# Patient Record
Sex: Female | Born: 1945 | Race: White | Hispanic: No | Marital: Married | Smoking: Never smoker
Health system: Southern US, Community
[De-identification: ages and names within clinical notes are randomized; demographics above are authoritative.]

## PROBLEM LIST (undated history)

## (undated) DIAGNOSIS — E0500 Thyrotoxicosis with diffuse goiter without thyrotoxic crisis or storm: Secondary | ICD-10-CM

## (undated) DIAGNOSIS — N200 Calculus of kidney: Secondary | ICD-10-CM

## (undated) DIAGNOSIS — R011 Cardiac murmur, unspecified: Secondary | ICD-10-CM

## (undated) DIAGNOSIS — Q278 Other specified congenital malformations of peripheral vascular system: Secondary | ICD-10-CM

## (undated) DIAGNOSIS — H269 Unspecified cataract: Secondary | ICD-10-CM

## (undated) DIAGNOSIS — D689 Coagulation defect, unspecified: Secondary | ICD-10-CM

## (undated) DIAGNOSIS — I6529 Occlusion and stenosis of unspecified carotid artery: Secondary | ICD-10-CM

## (undated) DIAGNOSIS — E079 Disorder of thyroid, unspecified: Secondary | ICD-10-CM

## (undated) DIAGNOSIS — I773 Arterial fibromuscular dysplasia: Secondary | ICD-10-CM

## (undated) DIAGNOSIS — N3946 Mixed incontinence: Secondary | ICD-10-CM

## (undated) DIAGNOSIS — D510 Vitamin B12 deficiency anemia due to intrinsic factor deficiency: Secondary | ICD-10-CM

## (undated) DIAGNOSIS — I10 Essential (primary) hypertension: Secondary | ICD-10-CM

## (undated) DIAGNOSIS — E119 Type 2 diabetes mellitus without complications: Secondary | ICD-10-CM

## (undated) DIAGNOSIS — D472 Monoclonal gammopathy: Secondary | ICD-10-CM

## (undated) DIAGNOSIS — G43909 Migraine, unspecified, not intractable, without status migrainosus: Secondary | ICD-10-CM

## (undated) DIAGNOSIS — E785 Hyperlipidemia, unspecified: Secondary | ICD-10-CM

## (undated) DIAGNOSIS — M889 Osteitis deformans of unspecified bone: Secondary | ICD-10-CM

## (undated) DIAGNOSIS — G63 Polyneuropathy in diseases classified elsewhere: Secondary | ICD-10-CM

## (undated) HISTORY — DX: Thyrotoxicosis with diffuse goiter without thyrotoxic crisis or storm: E05.00

## (undated) HISTORY — DX: Monoclonal gammopathy: D47.2

## (undated) HISTORY — PX: CATARACT EXTRACTION: SUR2

## (undated) HISTORY — PX: RENAL BIOPSY: SHX156

## (undated) HISTORY — PX: EYE SURGERY: SHX253

## (undated) HISTORY — DX: Unspecified cataract: H26.9

## (undated) HISTORY — DX: Mixed incontinence: N39.46

## (undated) HISTORY — DX: Osteitis deformans of unspecified bone: M88.9

## (undated) HISTORY — DX: Calculus of kidney: N20.0

## (undated) HISTORY — DX: Cardiac murmur, unspecified: R01.1

## (undated) HISTORY — DX: Arterial fibromuscular dysplasia: I77.3

## (undated) HISTORY — DX: Occlusion and stenosis of unspecified carotid artery: I65.29

## (undated) HISTORY — PX: WISDOM TOOTH EXTRACTION: SHX21

## (undated) HISTORY — DX: Type 2 diabetes mellitus without complications: E11.9

## (undated) HISTORY — PX: VAGINAL HYSTERECTOMY: SUR661

## (undated) HISTORY — DX: Essential (primary) hypertension: I10

## (undated) HISTORY — DX: Coagulation defect, unspecified: D68.9

## (undated) HISTORY — DX: Hyperlipidemia, unspecified: E78.5

## (undated) HISTORY — DX: Vitamin B12 deficiency anemia due to intrinsic factor deficiency: D51.0

## (undated) HISTORY — DX: Disorder of thyroid, unspecified: E07.9

## (undated) HISTORY — DX: Migraine, unspecified, not intractable, without status migrainosus: G43.909

## (undated) HISTORY — PX: BILATERAL SALPINGOOPHORECTOMY: SHX1223

## (undated) HISTORY — PX: TONSILLECTOMY: SUR1361

## (undated) HISTORY — PX: COLONOSCOPY: SHX174

## (undated) HISTORY — DX: Monoclonal gammopathy: G63

## (undated) HISTORY — DX: Other specified congenital malformations of peripheral vascular system: Q27.8

---

## 1999-02-26 ENCOUNTER — Ambulatory Visit (HOSPITAL_COMMUNITY): Admission: RE | Admit: 1999-02-26 | Discharge: 1999-02-26 | Payer: Self-pay | Admitting: Pediatrics

## 2004-01-14 LAB — HM COLONOSCOPY: HM Colonoscopy: NORMAL

## 2005-04-03 ENCOUNTER — Encounter: Admission: RE | Admit: 2005-04-03 | Discharge: 2005-04-03 | Payer: Self-pay | Admitting: Pediatrics

## 2006-05-08 ENCOUNTER — Ambulatory Visit: Payer: Self-pay | Admitting: Internal Medicine

## 2006-05-20 ENCOUNTER — Ambulatory Visit: Payer: Self-pay | Admitting: Internal Medicine

## 2008-06-16 ENCOUNTER — Ambulatory Visit: Payer: Self-pay | Admitting: Ophthalmology

## 2008-11-07 ENCOUNTER — Ambulatory Visit: Payer: Self-pay | Admitting: Nephrology

## 2009-03-06 ENCOUNTER — Ambulatory Visit: Payer: Self-pay | Admitting: Nephrology

## 2009-03-09 ENCOUNTER — Ambulatory Visit: Payer: Self-pay | Admitting: Urology

## 2009-03-16 ENCOUNTER — Ambulatory Visit: Payer: Self-pay | Admitting: Urology

## 2009-03-22 ENCOUNTER — Ambulatory Visit: Payer: Self-pay | Admitting: Urology

## 2009-07-27 ENCOUNTER — Ambulatory Visit: Payer: Self-pay | Admitting: Internal Medicine

## 2009-10-23 ENCOUNTER — Ambulatory Visit: Payer: Self-pay | Admitting: Urology

## 2009-12-27 ENCOUNTER — Ambulatory Visit: Payer: Self-pay | Admitting: Specialist

## 2010-10-14 ENCOUNTER — Ambulatory Visit: Payer: Self-pay | Admitting: Urology

## 2011-01-14 LAB — HM PAP SMEAR: HM Pap smear: NORMAL

## 2011-01-30 DIAGNOSIS — I150 Renovascular hypertension: Secondary | ICD-10-CM | POA: Insufficient documentation

## 2011-01-30 DIAGNOSIS — I129 Hypertensive chronic kidney disease with stage 1 through stage 4 chronic kidney disease, or unspecified chronic kidney disease: Secondary | ICD-10-CM | POA: Insufficient documentation

## 2011-02-13 LAB — HM MAMMOGRAPHY: HM Mammogram: NORMAL

## 2011-06-24 ENCOUNTER — Other Ambulatory Visit: Payer: Self-pay | Admitting: Internal Medicine

## 2011-06-25 MED ORDER — AMLODIPINE BESYLATE 2.5 MG PO TABS: 2.5000 mg | ORAL_TABLET | Freq: Every day | ORAL | Status: DC

## 2011-07-09 ENCOUNTER — Ambulatory Visit: Payer: Self-pay

## 2011-07-21 ENCOUNTER — Other Ambulatory Visit: Payer: Self-pay | Admitting: Internal Medicine

## 2011-07-21 DIAGNOSIS — R51 Headache: Secondary | ICD-10-CM

## 2011-07-21 MED ORDER — PROPRANOLOL HCL 10 MG PO TABS: 10.0000 mg | ORAL_TABLET | Freq: Two times a day (BID) | ORAL | Status: DC

## 2011-07-22 ENCOUNTER — Other Ambulatory Visit: Payer: Self-pay | Admitting: Internal Medicine

## 2011-08-20 ENCOUNTER — Other Ambulatory Visit: Payer: Self-pay | Admitting: Internal Medicine

## 2011-08-28 ENCOUNTER — Other Ambulatory Visit: Payer: Self-pay | Admitting: Internal Medicine

## 2011-08-28 MED ORDER — LEVOTHYROXINE SODIUM 50 MCG PO TABS: ORAL_TABLET | ORAL | Status: DC

## 2011-10-20 ENCOUNTER — Other Ambulatory Visit: Payer: Self-pay | Admitting: Internal Medicine

## 2011-10-22 NOTE — Telephone Encounter (Signed)
[facility] is requesting refill on amlodipine.  Pt hasn't been seen in this office and has no upcoming appts.

## 2011-11-24 ENCOUNTER — Telehealth: Payer: Self-pay | Admitting: Internal Medicine

## 2011-11-24 MED ORDER — LEVOTHYROXINE SODIUM 50 MCG PO TABS: ORAL_TABLET | ORAL | Status: DC

## 2011-11-24 NOTE — Telephone Encounter (Signed)
Rx has been called in  

## 2011-11-24 NOTE — Telephone Encounter (Signed)
[phone] Pt called to get refill on synthroid 50mg  1 daily 5 days week  75mg  1 daily 2 days week    90 supply [facility] Pt has appointment 4/17

## 2011-12-12 ENCOUNTER — Telehealth: Payer: Self-pay | Admitting: Internal Medicine

## 2011-12-12 ENCOUNTER — Ambulatory Visit (INDEPENDENT_AMBULATORY_CARE_PROVIDER_SITE_OTHER): Payer: BC Managed Care – PPO | Admitting: Internal Medicine

## 2011-12-12 VITALS — BP 122/60 | HR 88 | Temp 97.6°F | Ht 63.0 in | Wt 115.0 lb

## 2011-12-12 DIAGNOSIS — R309 Painful micturition, unspecified: Secondary | ICD-10-CM

## 2011-12-12 DIAGNOSIS — R319 Hematuria, unspecified: Secondary | ICD-10-CM

## 2011-12-12 DIAGNOSIS — N390 Urinary tract infection, site not specified: Secondary | ICD-10-CM

## 2011-12-12 DIAGNOSIS — N23 Unspecified renal colic: Secondary | ICD-10-CM

## 2011-12-12 LAB — POCT URINALYSIS DIPSTICK
Bilirubin, UA: NEGATIVE
Glucose, UA: NEGATIVE
Ketones, UA: NEGATIVE
Nitrite, UA: POSITIVE
Protein, UA: NEGATIVE
Spec Grav, UA: 1.015
Urobilinogen, UA: 0.2
pH, UA: 6

## 2011-12-12 MED ORDER — CIPROFLOXACIN HCL 250 MG PO TABS: 250.0000 mg | ORAL_TABLET | Freq: Two times a day (BID) | ORAL | Status: AC

## 2011-12-12 NOTE — Telephone Encounter (Signed)
Patient is coming in to give a specimen.

## 2011-12-12 NOTE — Assessment & Plan Note (Signed)
Symptoms and urinalysis are consistent with urinary tract infection. Will treat with Cipro x7 days. Patient will call if symptoms are not improving.

## 2011-12-12 NOTE — Progress Notes (Signed)
  Subjective:    Patient ID: Hailey Hunt, female    DOB: 06/30/1946, 66 y.o.   MRN: [id number]  HPI 66 year old female presents for an urgent visit complaining of an 8 hour history of increased urinary frequency, foul smell to her urine, and dysuria. She denies any fever, chills, flank pain.  Outpatient Encounter Prescriptions as of 12/12/2011  Medication Sig Dispense Refill  . amLODipine (NORVASC) 2.5 MG tablet TAKE 1 TABLET EVERY DAY  30 tablet  6  . cyanocobalamin (,VITAMIN B-12,) 1000 MCG/ML injection Inject 1,000 mcg into the muscle once.      Marland Kitchen levothyroxine (SYNTHROID) 50 MCG tablet Take one tablet daily five days per week and one and one-half tablets two days per week.  90 tablet  3  . propranolol (INDERAL) 10 MG tablet Take 1 tablet (10 mg total) by mouth 2 (two) times daily.  60 tablet  11  . traMADol (ULTRAM) 50 MG tablet Take 50 mg by mouth every 6 (six) hours as needed.      . ciprofloxacin (CIPRO) 250 MG tablet Take 1 tablet (250 mg total) by mouth 2 (two) times daily.  14 tablet  0    Review of Systems  Constitutional: Negative for fever, chills and fatigue.  Genitourinary: Positive for dysuria, urgency and frequency. Negative for hematuria and flank pain.  Musculoskeletal: Negative for myalgias, arthralgias and gait problem.  Neurological: Negative for dizziness.   BP 122/60  Pulse 88  Temp(Src) 97.6 F (36.4 C) (Oral)  Ht 5\' 3"  (1.6 m)  Wt 115 lb (52.164 kg)  BMI 20.37 kg/m2  SpO2 99%     Objective:   Physical Exam  Constitutional: She is oriented to person, place, and time. She appears well-developed and well-nourished. No distress.  HENT:  Head: Normocephalic and atraumatic.  Eyes: EOM are normal. Pupils are equal, round, and reactive to light.  Neck: Normal range of motion. Neck supple.  Pulmonary/Chest: Effort normal.  Abdominal: Soft. There is no tenderness (no cva tenderness).  Musculoskeletal: Normal range of motion.  Neurological: She is alert and  oriented to person, place, and time. She has normal reflexes.  Skin: Skin is warm and dry. She is not diaphoretic.  Psychiatric: She has a normal mood and affect. Her behavior is normal. Judgment and thought content normal.          Assessment & Plan:

## 2011-12-12 NOTE — Telephone Encounter (Signed)
[facility] Triage Call Report Triage Record Num: [id number] Operator: Albertine Grates Patient Name: Hailey Hunt Call Date & Time: 12/12/2011 2:52:42PM Patient Phone: [phone] PCP: Duncan Dull Patient Gender: Female PCP Fax : [phone] Patient DOB: March 22, 1946 Practice Name: [facility] Reason for Call: Caller: [name]/Patient; PCP: Duncan Dull; CB#: [phone]; ; ; Call regarding Urinary Pain/Bleeding; Has painful urination since 3-15. Urine has foul odor. Afebrile but has chills. States has used home test strip and was positive for UTI. Is drinking fluids. Feels nauseated when drinks. Disposition see provider within 24hrs. States has history of kidney stones and thinks this will go "fast". States will end up in ED if not handled. PLEASE CALL PT AND ADVISE IF NEEDS TO SEE MD 3-15 OR CAN CALL IN ANTIBIOTIC [phone]. Wants called ASAP as if needs to go to UC wants to go before "everybody gets off work". Protocol(s) Used: Urinary Symptoms - Female Recommended Outcome per Protocol: See Provider within 24 hours Reason for Outcome: Has one or more urinary tract symptoms AND has not been previously evaluated Care Advice: ~ 12/12/2011 3:01:25PM Page 1 of 1 [email or web address]

## 2011-12-15 LAB — CULTURE, URINE COMPREHENSIVE: Colony Count: >=100000

## 2011-12-29 LAB — HM DEXA SCAN

## 2012-01-13 LAB — HM PAP SMEAR: HM Pap smear: NORMAL

## 2012-01-14 ENCOUNTER — Ambulatory Visit (INDEPENDENT_AMBULATORY_CARE_PROVIDER_SITE_OTHER): Payer: BC Managed Care – PPO | Admitting: Internal Medicine

## 2012-01-14 ENCOUNTER — Encounter: Payer: Self-pay | Admitting: Internal Medicine

## 2012-01-14 VITALS — BP 108/60 | HR 66 | Temp 98.2°F | Resp 14 | Ht 63.0 in | Wt 115.8 lb

## 2012-01-14 DIAGNOSIS — Z1239 Encounter for other screening for malignant neoplasm of breast: Secondary | ICD-10-CM

## 2012-01-14 DIAGNOSIS — R5381 Other malaise: Secondary | ICD-10-CM

## 2012-01-14 DIAGNOSIS — I10 Essential (primary) hypertension: Secondary | ICD-10-CM

## 2012-01-14 DIAGNOSIS — E039 Hypothyroidism, unspecified: Secondary | ICD-10-CM

## 2012-01-14 DIAGNOSIS — I773 Arterial fibromuscular dysplasia: Secondary | ICD-10-CM | POA: Insufficient documentation

## 2012-01-14 DIAGNOSIS — D510 Vitamin B12 deficiency anemia due to intrinsic factor deficiency: Secondary | ICD-10-CM | POA: Insufficient documentation

## 2012-01-14 DIAGNOSIS — Z9289 Personal history of other medical treatment: Secondary | ICD-10-CM

## 2012-01-14 DIAGNOSIS — I7789 Other specified disorders of arteries and arterioles: Secondary | ICD-10-CM

## 2012-01-14 DIAGNOSIS — Z13820 Encounter for screening for osteoporosis: Secondary | ICD-10-CM

## 2012-01-14 DIAGNOSIS — R5383 Other fatigue: Secondary | ICD-10-CM

## 2012-01-14 DIAGNOSIS — E559 Vitamin D deficiency, unspecified: Secondary | ICD-10-CM

## 2012-01-14 DIAGNOSIS — Z9189 Other specified personal risk factors, not elsewhere classified: Secondary | ICD-10-CM

## 2012-01-14 NOTE — Progress Notes (Signed)
Patient ID: Hailey Hunt, female   DOB: Jan 29, 1946, 66 y.o.   MRN: [id number]   Patient Active Problem List  Diagnoses  . Urinary tract infection  . Pernicious anemia  . History of transfusion of packed red blood cells  . Fibromuscular dysplasia of renal artery  . Hypertension  . Neuropathy with IgA monoclonal gammopathy  . Dysplasia, artery, fibromuscular, renal    Subjective:  CC:   Chief Complaint  Patient presents with  . Annual Exam    HPI:   Hailey Hunt a 65 y.o. female who presents for her annual exam,  She does not need a breast or pelvic exam as this was done by her gynecologist at [facility] and [place],.  She had a colonoscopy 2005 at age 57, which was normal.  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  She has no acute issues.    Past Medical History  Diagnosis Date  . Kidney stone   . Hypertension   . Fibromuscular dysplasia of renal artery     managed by Dr. Mel[facility]  . Neuropathy with IgA monoclonal gammopathy     Dr Ellis Savage  . Dysplasia, artery, fibromuscular, renal     Past Surgical History  Procedure Date  . Cataract extraction Sep 10, 2011, Jan 13,2013  . Renal biopsy   . Vaginal delivery     x2         The following portions of the patient's history were reviewed and updated as appropriate: Allergies, current medications, and problem list.    Review of Systems:   12 Pt  review of systems was negative except those addressed in the HPI,     History   Social History  . Marital Status: Married    Spouse Name: N/A    Number of Children: 2  . 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 Smoking status: Never Smoker   . Smokeless tobacco: Never Used  . Alcohol Use: No  . Drug Use: No  . Sexually Active: Not on file   Other Topics Concern  . Not on file   Social  History Narrative  . No narrative on file    Objective:  BP 108/60  Pulse 66  Temp(Src) 98.2 F (36.8 C) (Oral)  Resp 14  Ht 5\' 3"  (1.6 m)  Wt 115 lb 12 oz (52.504 kg)  BMI 20.50 kg/m2  SpO2 99%  General appearance: alert, cooperative and appears stated age Ears: normal TM's and external ear canals both ears Throat: lips, mucosa, and tongue normal; teeth and gums normal Neck: no adenopathy, no carotid bruit, supple, symmetrical, trachea midline and thyroid not enlarged, symmetric, no tenderness/mass/nodules Back: symmetric, no curvature. ROM normal. No CVA tenderness. Lungs: clear to auscultation bilaterally Heart: regular rate and rhythm, S1, S2 normal, no murmur, click, rub or gallop Abdomen: soft, non-tender; bowel sounds normal; no masses,  no organomegaly Pulses: 2+ and symmetric Skin: Skin color, texture, turgor normal. No rashes or lesions Lymph nodes: Cervical, supraclavicular, and axillary nodes normal.  Assessment and Plan:  Fibromuscular dysplasia of renal artery Found by [facility] in August,  Carotids 2011.  Annual surveillance in August .  Blood pressure is well  Controlled. Carotid bruits, bilateral are noted.   Pernicious anemia She has not had a b12 injection in months.  Will check level  History of transfusion of packed red blood cells She has had a prior transfusion and has requested screening for hepatitis C    Updated Medication List Outpatient Encounter Prescriptions as of 01/14/2012  Medication Sig Dispense Refill  . adapalene (DIFFERIN) 0.1 % cream Apply topically at bedtime.      Marland Kitchen amLODipine (NORVASC) 2.5 MG tablet TAKE 1 TABLET EVERY DAY  30 tablet  6  . hydrochlorothiazide (HYDRODIURIL) 25 MG tablet Take 25 mg by mouth daily.      Marland Kitchen levothyroxine (SYNTHROID, LEVOTHROID) 50 MCG tablet Take one tablet daily five days per week and one and one-half tablets two days per week.  Name Brand Only      . propranolol (INDERAL) 10 MG tablet Take 1 tablet (10  mg total) by mouth 2 (two) times daily.  60 tablet  11  . traMADol (ULTRAM) 50 MG tablet Take 50 mg by mouth every 6 (six) hours as needed.      Marland Kitchen DISCONTD: levothyroxine (SYNTHROID) 50 MCG tablet Take one tablet daily five days per week and one and one-half tablets two days per week.  90 tablet  3  . DISCONTD: cyanocobalamin (,VITAMIN B-12,) 1000 MCG/ML injection Inject 1,000 mcg into the muscle once.         Orders Placed This Encounter  Procedures  . HM MAMMOGRAPHY  . DG Bone Density  . HM PAP SMEAR  . Vitamin D 25 hydroxy  . TSH  . Lipid panel  . COMPLETE METABOLIC PANEL WITH GFR  . Vitamin B12  . CBC with Differential  . Hepatitis C Antibody  . HM COLONOSCOPY    No Follow-up on file.

## 2012-01-14 NOTE — Patient Instructions (Signed)
Sign up for Mychart and you can get your labs online and send messages this way.

## 2012-01-14 NOTE — Assessment & Plan Note (Addendum)
Found by [facility] in August,  Carotids 2011.  Annual surveillance in August .  Blood pressure is well  Controlled. Carotid bruits, bilateral are noted.

## 2012-01-15 ENCOUNTER — Encounter: Payer: Self-pay | Admitting: Internal Medicine

## 2012-01-15 DIAGNOSIS — I773 Arterial fibromuscular dysplasia: Secondary | ICD-10-CM | POA: Insufficient documentation

## 2012-01-15 NOTE — Assessment & Plan Note (Signed)
She has had a prior transfusion and has requested screening for hepatitis C

## 2012-01-15 NOTE — Assessment & Plan Note (Signed)
She has not had a b12 injection in months.  Will check level

## 2012-01-20 ENCOUNTER — Other Ambulatory Visit: Payer: BC Managed Care – PPO

## 2012-01-20 LAB — COMPLETE METABOLIC PANEL WITHOUT GFR
ALT: 12 U/L (ref 0–35)
AST: 22 U/L (ref 0–37)
Albumin: 4.6 g/dL (ref 3.5–5.2)
Alkaline Phosphatase: 90 U/L (ref 39–117)
BUN: 16 mg/dL (ref 6–23)
CO2: 29 meq/L (ref 19–32)
Calcium: 10 mg/dL (ref 8.4–10.5)
Chloride: 103 meq/L (ref 96–112)
Creat: 1.07 mg/dL (ref 0.50–1.10)
GFR, Est African American: 64 mL/min
GFR, Est Non African American: 55 mL/min — ABNORMAL LOW
Glucose, Bld: 89 mg/dL (ref 70–99)
Potassium: 3.9 meq/L (ref 3.5–5.3)
Sodium: 143 meq/L (ref 135–145)
Total Bilirubin: 0.6 mg/dL (ref 0.3–1.2)
Total Protein: 6.9 g/dL (ref 6.0–8.3)

## 2012-01-20 LAB — CBC WITH DIFFERENTIAL/PLATELET
Basophils Absolute: 0 10*3/uL (ref 0.0–0.1)
Basophils Relative: 0.9 % (ref 0.0–3.0)
Eosinophils Absolute: 0.2 10*3/uL (ref 0.0–0.7)
Eosinophils Relative: 3.2 % (ref 0.0–5.0)
HCT: 38.9 % (ref 36.0–46.0)
Hemoglobin: 12.6 g/dL (ref 12.0–15.0)
Lymphocytes Relative: 28.9 % (ref 12.0–46.0)
Lymphs Abs: 1.7 10*3/uL (ref 0.7–4.0)
MCHC: 32.4 g/dL (ref 30.0–36.0)
MCV: 88.9 fl (ref 78.0–100.0)
Monocytes Absolute: 0.3 10*3/uL (ref 0.1–1.0)
Monocytes Relative: 5.4 % (ref 3.0–12.0)
Neutro Abs: 3.5 10*3/uL (ref 1.4–7.7)
Neutrophils Relative %: 61.6 % (ref 43.0–77.0)
Platelets: 327 10*3/uL (ref 150.0–400.0)
RBC: 4.37 Mil/uL (ref 3.87–5.11)
RDW: 14.4 % (ref 11.5–14.6)
WBC: 5.7 10*3/uL (ref 4.5–10.5)

## 2012-01-20 LAB — LIPID PANEL
Cholesterol: 228 mg/dL — ABNORMAL HIGH (ref 0–200)
HDL: 80.5 mg/dL (ref >39.00)
Total CHOL/HDL Ratio: 3
Triglycerides: 68 mg/dL (ref 0.0–149.0)
VLDL: 13.6 mg/dL (ref 0.0–40.0)

## 2012-01-20 LAB — LDL CHOLESTEROL, DIRECT: Direct LDL: 138.5 mg/dL

## 2012-01-20 LAB — TSH: TSH: 0.38 u[IU]/mL (ref 0.35–5.50)

## 2012-01-20 LAB — VITAMIN B12: Vitamin B-12: 174 pg/mL — ABNORMAL LOW (ref 211–911)

## 2012-01-21 ENCOUNTER — Telehealth: Payer: Self-pay | Admitting: Internal Medicine

## 2012-01-21 DIAGNOSIS — E559 Vitamin D deficiency, unspecified: Secondary | ICD-10-CM | POA: Insufficient documentation

## 2012-01-21 LAB — HEPATITIS C ANTIBODY: HCV Ab: NEGATIVE

## 2012-01-21 LAB — VITAMIN D 25 HYDROXY (VIT D DEFICIENCY, FRACTURES): Vit D, 25-Hydroxy: 21 ng/mL — ABNORMAL LOW (ref 30–89)

## 2012-01-21 MED ORDER — ERGOCALCIFEROL 1.25 MG (50000 UT) PO CAPS: 50000.0000 [IU] | ORAL_CAPSULE | ORAL | Status: DC

## 2012-01-21 NOTE — Progress Notes (Signed)
Addended by: Duncan Dull on: 01/21/2012 01:30 PM   Modules accepted: Orders

## 2012-01-22 MED ORDER — "SYRINGE 27G X 1-1/4"" 3 ML MISC": Status: DC

## 2012-01-22 MED ORDER — CYANOCOBALAMIN 1000 MCG/ML IJ SOLN: INTRAMUSCULAR | Status: DC

## 2012-01-27 ENCOUNTER — Telehealth: Payer: Self-pay | Admitting: Internal Medicine

## 2012-01-27 NOTE — Telephone Encounter (Signed)
Bone density was normal except mild bone loss left hip.  contineu calcium/vitmain d and exercise.  Repeat in 2 yrs

## 2012-01-27 NOTE — Telephone Encounter (Signed)
Patient notified

## 2012-02-04 ENCOUNTER — Encounter: Payer: Self-pay | Admitting: Internal Medicine

## 2012-02-12 ENCOUNTER — Ambulatory Visit: Payer: BC Managed Care – PPO

## 2012-02-12 ENCOUNTER — Other Ambulatory Visit (INDEPENDENT_AMBULATORY_CARE_PROVIDER_SITE_OTHER): Payer: BC Managed Care – PPO | Admitting: *Deleted

## 2012-02-12 DIAGNOSIS — N390 Urinary tract infection, site not specified: Secondary | ICD-10-CM

## 2012-02-12 LAB — HM MAMMOGRAPHY: HM Mammogram: NORMAL

## 2012-02-13 LAB — URINALYSIS
Bilirubin Urine: NEGATIVE
Glucose, UA: NEGATIVE mg/dL
Ketones, ur: NEGATIVE mg/dL
Nitrite: NEGATIVE
Protein, ur: NEGATIVE mg/dL
Specific Gravity, Urine: 1.01 (ref 1.005–1.030)
Urobilinogen, UA: 0.2 mg/dL (ref 0.0–1.0)
pH: 7 (ref 5.0–8.0)

## 2012-02-15 LAB — URINE CULTURE: Colony Count: >=100000

## 2012-02-16 ENCOUNTER — Encounter: Payer: Self-pay | Admitting: Internal Medicine

## 2012-03-03 ENCOUNTER — Encounter: Payer: Self-pay | Admitting: Internal Medicine

## 2012-03-03 ENCOUNTER — Ambulatory Visit (INDEPENDENT_AMBULATORY_CARE_PROVIDER_SITE_OTHER): Payer: BC Managed Care – PPO | Admitting: Internal Medicine

## 2012-03-03 VITALS — BP 102/62 | HR 80 | Temp 99.1°F | Ht 63.0 in | Wt 111.5 lb

## 2012-03-03 DIAGNOSIS — A938 Other specified arthropod-borne viral fevers: Secondary | ICD-10-CM

## 2012-03-03 LAB — CBC WITH DIFFERENTIAL/PLATELET
Basophils Absolute: 0 10*3/uL (ref 0.0–0.1)
Basophils Relative: 0.5 % (ref 0.0–3.0)
Eosinophils Absolute: 0 10*3/uL (ref 0.0–0.7)
Eosinophils Relative: 0.1 % (ref 0.0–5.0)
HCT: 37.9 % (ref 36.0–46.0)
Hemoglobin: 12.4 g/dL (ref 12.0–15.0)
Lymphocytes Relative: 18.2 % (ref 12.0–46.0)
Lymphs Abs: 1.2 10*3/uL (ref 0.7–4.0)
MCHC: 32.8 g/dL (ref 30.0–36.0)
MCV: 87 fl (ref 78.0–100.0)
Monocytes Absolute: 0.4 10*3/uL (ref 0.1–1.0)
Monocytes Relative: 6.8 % (ref 3.0–12.0)
Neutro Abs: 4.7 10*3/uL (ref 1.4–7.7)
Neutrophils Relative %: 74.4 % (ref 43.0–77.0)
Platelets: 247 10*3/uL (ref 150.0–400.0)
RBC: 4.35 Mil/uL (ref 3.87–5.11)
RDW: 14.8 % — ABNORMAL HIGH (ref 11.5–14.6)
WBC: 6.4 10*3/uL (ref 4.5–10.5)

## 2012-03-03 LAB — COMPREHENSIVE METABOLIC PANEL
ALT: 11 U/L (ref 0–35)
AST: 22 U/L (ref 0–37)
Albumin: 4.1 g/dL (ref 3.5–5.2)
Alkaline Phosphatase: 86 U/L (ref 39–117)
BUN: 13 mg/dL (ref 6–23)
CO2: 29 mEq/L (ref 19–32)
Calcium: 8.9 mg/dL (ref 8.4–10.5)
Chloride: 101 mEq/L (ref 96–112)
Creatinine, Ser: 1.2 mg/dL (ref 0.4–1.2)
GFR: 48.6 mL/min — ABNORMAL LOW (ref >60.00)
Glucose, Bld: 80 mg/dL (ref 70–99)
Potassium: 3.7 mEq/L (ref 3.5–5.1)
Sodium: 140 mEq/L (ref 135–145)
Total Bilirubin: 0.6 mg/dL (ref 0.3–1.2)
Total Protein: 6.8 g/dL (ref 6.0–8.3)

## 2012-03-03 MED ORDER — DOXYCYCLINE HYCLATE 100 MG PO TABS: 100.0000 mg | ORAL_TABLET | Freq: Two times a day (BID) | ORAL | Status: AC

## 2012-03-03 NOTE — Assessment & Plan Note (Signed)
Symptoms are most consistent with [facility] Spotted Fever or erhlichiosis. Will treat with doxycycline twice daily for 2 weeks. Given that she has significant fatigue, will check CBC and IgM testing for RMSF. She will use Tylenol as needed for fever. If symptoms worsen or persist over the next 48 hours, she will call our office.

## 2012-03-03 NOTE — Patient Instructions (Signed)
[place] Spotted Fever °[place] Spotted Fever (RMSF) is the oldest known tick-borne disease of people in the [place]. This disease was named because it was first described among people in the [place] area who had an illness characterized by a rash with red-purple-black spots. This disease is caused by a rickettsia (Rickettsia rickettsii), a bacteria carried by the tick. °The [place] wood tick and the [place] dog tick, acquire and transmit the RMSF bacteria (pictures NOT actual size). When a larval, nymphal or adult tick feeds on an infected rodent or larger animal, the tick can become infected. Infected adult ticks then feed on people who may then get RMSF. The tick transmits the disease to humans during a prolonged period of feeding that lasts many hours, days or even a couple weeks. The bite is painless and frequently goes unnoticed. An infected female tick may also pass the rickettsial bacteria to her eggs that then may mature to be infected adult ticks. °The rickettsia that causes RMSF can also get into a person's body through damaged skin. A tick bite is not necessary. People can get RMSF if they crush a tick and get it's blood or body fluids on their skin through a small cut or sore.  °DIAGNOSIS °Diagnosis is made by laboratory tests.  °TREATMENT °Treatment is with antibiotics (medications that kill rickettsia and other bacteria). Immediate treatment usually prevents death. °GEOGRAPHIC RANGE °This disease was reported only in the [place] until 1931. RMSF has more recently been described among individuals in all states except [place], [place] and [place]. The highest reported incidences of RMSF now occur among residents of [place], [place], [place] and the [place]. °TIME OF YEAR  °Most cases are diagnosed during late spring and summer when ticks are most active. However, especially in the warmer southern states, a few cases occur during the winter. °SYMPTOMS   °· Symptoms of RMSF begin from 2 to 14 days after a tick bite. The most common early symptoms are fever, muscle aches and headache followed by nausea (feeling sick to your stomach) or vomiting.  °· The RMSF rash is typically delayed until 3 or more days after symptom onset, and eventually develops in 9 of 10 infected patients by the 5th day of illness.  °If the disease is not treated it can cause death. If you get a fever, headache, muscle aches, rash, nausea or vomiting within 2 weeks of a possible tick bite or exposure you should see your caregiver immediately. °PREVENTION °Ticks prefer to hide in shady, moist ground litter. They can often be found above the ground clinging to tall grass, brush, shrubs and low tree branches. They also inhabit lawns and gardens, especially at the edges of woodlands and around old stone walls. Within the areas where ticks generally live, no naturally vegetated area can be considered completely free of infected ticks. The best precaution against RMSF is to avoid contact with soil, leaf litter and vegetation as much as possible in tick infested areas. For those who enjoy gardening or walking in their yards, clear brush and mow tall grass around houses and at the edges of gardens. This may help reduce the tick population in the immediate area. Applications of chemical insecticides by a licensed professional in the spring (late May) and Fall (September) will also control ticks, especially in heavily infested areas. Treatment will never get rid of all the ticks. Getting rid of small animal populations that host ticks will also decrease the tick population. When working in   the garden, pruning shrubs, or handling soil and vegetation, wear light-colored protective clothing and gloves. Spot-check often to prevent ticks from reaching the skin. Ticks cannot jump or fly. They will not drop from an above-ground perch onto a passing animal. Once a tick gains access to human skin it climbs upward  until it reaches a more protected area. For example, the back of the knee, groin, navel, armpit, ears or nape of the neck. It then begins the slow process of embedding itself in the skin. °Campers, hikers, field workers, and others who spend time in wooded, brushy or tall grassy areas can avoid exposure to ticks by using the following precautions: °· Wear light-colored clothing with a tight weave to spot ticks more easily and prevent contact with the skin.  °· Wear long pants tucked into socks, long-sleeved shirts tucked into pants and enclosed shoes or boots along with insect repellent.  °· Spray clothes with insect repellent containing either DEET or Permethrin. Only DEET can be used on exposed skin. Follow the manufacturer's directions carefully.  °· Wear a hat and keep long hair pulled back.  °· Stay on cleared, well-worn trails whenever possible.  °· Spot-check yourself and others often for the presence of ticks on clothes. If you find one, there are likely to be others. Check thoroughly.  °· Remove clothes after leaving tick-infested areas. If possible, wash them to eliminate any unseen ticks. Check yourself, your children and any pets from head to toe for the presence of ticks.  °· Shower and shampoo.  °You can greatly reduce your chances of contracting RMSF if you remove attached ticks as soon as possible. Regular checks of the body, including all body sites covered by hair (head, armpits, genitals), allow removal of the tick before rickettsial transmission. To remove an attached tick, use a forceps or tweezers to detach the intact tick without leaving mouth parts in the skin. The tick bite wound should be cleansed after tick removal. °Remember the most common symptoms of RMSF are fever, muscle aches, headache and nausea or vomiting with a later onset of rash. If you get these symptoms after a tick bite and while living in an area where RMSF is found, RMSF should be suspected. If the disease is not treated,  it can cause death. See your caregiver immediately if you get these symptoms. Do this even if not aware of a tick bite. °Document Released: 12/28/2000 Document Revised: 09/04/2011 Document Reviewed: 08/20/2009 °ExitCare® Patient Information ©2012 [employer]. °

## 2012-03-03 NOTE — Progress Notes (Signed)
Subjective:    Patient ID: Hailey Hunt, female    DOB: 1946/02/26, 66 y.o.   MRN: [id number]  HPI 66 year old female with history of hypertension, hypothyroidism presents with three-day history of fever, diffuse myalgias, headache, and general malaise. She also has occasional cough which is nonproductive. She has not had any sinus congestion or drainage. She notes that approximately 10 days ago she was bit by a tick in her groin. She removed the tick and brings in with her today. She continues to have some redness and swelling at the site of the tick bite. She has not taken any medications for her symptoms. She does not have any rash.  Outpatient Encounter Prescriptions as of 03/03/2012  Medication Sig Dispense Refill  . adapalene (DIFFERIN) 0.1 % cream Apply topically at bedtime.      Marland Kitchen amLODipine (NORVASC) 2.5 MG tablet TAKE 1 TABLET EVERY DAY  30 tablet  6  . cyanocobalamin (,VITAMIN B-12,) 1000 MCG/ML injection Inject 1 ml into muscle weekly for 3 weeks, then monthly  10 mL  1  . ergocalciferol (DRISDOL) 50000 UNITS capsule Take 1 capsule (50,000 Units total) by mouth once a week.  4 capsule  0  . hydrochlorothiazide (HYDRODIURIL) 25 MG tablet Take 25 mg by mouth daily.      Marland Kitchen levothyroxine (SYNTHROID, LEVOTHROID) 50 MCG tablet Take one tablet daily five days per week and one and one-half tablets two days per week.  Name Brand Only      . propranolol (INDERAL) 10 MG tablet Take 1 tablet (10 mg total) by mouth 2 (two) times daily.  60 tablet  11  . Syringe/Needle, Disp, (SYRINGE 3CC/27GX1-1/4") 27G X 1-1/4" 3 ML MISC For B12 injections  100 each  0  . doxycycline (VIBRA-TABS) 100 MG tablet Take 1 tablet (100 mg total) by mouth 2 (two) times daily.  28 tablet  0  . DISCONTD: traMADol (ULTRAM) 50 MG tablet Take 50 mg by mouth every 6 (six) hours as needed.        Review of Systems  Constitutional: Positive for fever, chills and fatigue. Negative for appetite change and unexpected weight  change.  HENT: Negative for ear pain, congestion, sore throat, trouble swallowing, neck pain, voice change and sinus pressure.   Eyes: Negative for visual disturbance.  Respiratory: Positive for cough. Negative for shortness of breath, wheezing and stridor.   Cardiovascular: Negative for chest pain, palpitations and leg swelling.  Gastrointestinal: Negative for nausea, vomiting, abdominal pain, diarrhea, constipation, blood in stool, abdominal distention and anal bleeding.  Genitourinary: Negative for dysuria and flank pain.  Musculoskeletal: Positive for myalgias and arthralgias. Negative for gait problem.  Skin: Positive for color change and wound. Negative for rash.  Neurological: Negative for dizziness and headaches.  Hematological: Negative for adenopathy. Does not bruise/bleed easily.  Psychiatric/Behavioral: Negative for suicidal ideas, sleep disturbance and dysphoric mood. The patient is not nervous/anxious.    BP 102/62  Pulse 80  Temp(Src) 99.1 F (37.3 C) (Oral)  Ht 5\' 3"  (1.6 m)  Wt 111 lb 8 oz (50.576 kg)  BMI 19.75 kg/m2  SpO2 96%     Objective:   Physical Exam  Constitutional: She is oriented to person, place, and time. She appears well-developed and well-nourished. No distress.  HENT:  Head: Normocephalic and atraumatic.  Right Ear: External ear normal.  Left Ear: External ear normal.  Nose: Nose normal.  Mouth/Throat: Oropharynx is clear and moist. No oropharyngeal exudate.  Eyes: Conjunctivae are normal. Pupils are  equal, round, and reactive to light. Right eye exhibits no discharge. Left eye exhibits no discharge. No scleral icterus.  Neck: Normal range of motion. Neck supple. No tracheal deviation present. No thyromegaly present.  Cardiovascular: Normal rate, regular rhythm, normal heart sounds and intact distal pulses.  Exam reveals no gallop and no friction rub.   No murmur heard. Pulmonary/Chest: Effort normal and breath sounds normal. No respiratory  distress. She has no wheezes. She has no rales. She exhibits no tenderness.  Musculoskeletal: Normal range of motion. She exhibits no edema and no tenderness.  Lymphadenopathy:    She has no cervical adenopathy.  Neurological: She is alert and oriented to person, place, and time. No cranial nerve deficit. She exhibits normal muscle tone. Coordination normal.  Skin: Skin is warm and dry. Lesion noted. No rash noted. She is not diaphoretic. There is erythema. No pallor.     Psychiatric: She has a normal mood and affect. Her behavior is normal. Judgment and thought content normal.          Assessment & Plan:

## 2012-03-04 LAB — ROCKY MTN SPOTTED FVR AB, IGM-BLOOD: ROCKY MTN SPOTTED FEVER, IGM: 0.07 IV

## 2012-03-05 ENCOUNTER — Encounter: Payer: Self-pay | Admitting: Internal Medicine

## 2012-03-08 ENCOUNTER — Encounter: Payer: Self-pay | Admitting: Internal Medicine

## 2012-03-09 ENCOUNTER — Ambulatory Visit: Payer: BC Managed Care – PPO | Admitting: Internal Medicine

## 2012-04-27 ENCOUNTER — Telehealth: Payer: Self-pay | Admitting: Internal Medicine

## 2012-04-27 MED ORDER — TRETINOIN 0.025 % EX CREA: TOPICAL_CREAM | Freq: Every day | CUTANEOUS | Status: DC

## 2012-04-27 NOTE — Telephone Encounter (Signed)
Patient needs her 45 tretinoin 0.025% called into [facility] pharmacy.

## 2012-06-01 ENCOUNTER — Ambulatory Visit: Payer: Self-pay | Admitting: Urology

## 2012-06-01 ENCOUNTER — Other Ambulatory Visit: Payer: Self-pay | Admitting: *Deleted

## 2012-06-01 MED ORDER — AMLODIPINE BESYLATE 2.5 MG PO TABS: 2.5000 mg | ORAL_TABLET | Freq: Every day | ORAL | Status: DC

## 2012-06-17 ENCOUNTER — Ambulatory Visit: Payer: Self-pay | Admitting: Vascular Surgery

## 2012-06-29 ENCOUNTER — Telehealth: Payer: Self-pay | Admitting: Internal Medicine

## 2012-06-29 DIAGNOSIS — R51 Headache: Secondary | ICD-10-CM

## 2012-06-29 NOTE — Telephone Encounter (Signed)
Refill request propranolol hcl 10 mg tab #60  Sig: take one tablet twice a day

## 2012-06-30 MED ORDER — PROPRANOLOL HCL 10 MG PO TABS: 10.0000 mg | ORAL_TABLET | Freq: Two times a day (BID) | ORAL | Status: DC

## 2012-07-13 ENCOUNTER — Encounter: Payer: Self-pay | Admitting: Internal Medicine

## 2012-07-13 ENCOUNTER — Ambulatory Visit (INDEPENDENT_AMBULATORY_CARE_PROVIDER_SITE_OTHER): Payer: BC Managed Care – PPO | Admitting: Internal Medicine

## 2012-07-13 VITALS — BP 112/64 | HR 61 | Temp 97.5°F | Ht 63.5 in | Wt 113.5 lb

## 2012-07-13 DIAGNOSIS — R519 Headache, unspecified: Secondary | ICD-10-CM

## 2012-07-13 DIAGNOSIS — I10 Essential (primary) hypertension: Secondary | ICD-10-CM

## 2012-07-13 DIAGNOSIS — I7789 Other specified disorders of arteries and arterioles: Secondary | ICD-10-CM

## 2012-07-13 DIAGNOSIS — Z23 Encounter for immunization: Secondary | ICD-10-CM

## 2012-07-13 DIAGNOSIS — I773 Arterial fibromuscular dysplasia: Secondary | ICD-10-CM

## 2012-07-13 DIAGNOSIS — E039 Hypothyroidism, unspecified: Secondary | ICD-10-CM

## 2012-07-13 DIAGNOSIS — R51 Headache: Secondary | ICD-10-CM

## 2012-07-13 DIAGNOSIS — D510 Vitamin B12 deficiency anemia due to intrinsic factor deficiency: Secondary | ICD-10-CM

## 2012-07-13 DIAGNOSIS — E279 Disorder of adrenal gland, unspecified: Secondary | ICD-10-CM

## 2012-07-13 NOTE — Progress Notes (Signed)
Patient ID: Hailey Hunt, female   DOB: 1945-10-05, 66 y.o.   MRN: [id number] Patient Active Problem List  Diagnosis  . Pernicious anemia  . History of transfusion of packed red blood cells  . Fibromuscular dysplasia of renal artery  . Hypertension  . Neuropathy with IgA monoclonal gammopathy  . Vitamin d deficiency  . Tick-borne fever  . Unspecified hypothyroidism  . Nocturnal headaches    Subjective:  CC:   Chief Complaint  Patient presents with  . Follow-up    flu shot    HPI:   Hailey Hunt a 66 y.o. female who presents Six-month followup on chronic conditions. She recently developed recurrent nightly headaches which prompted MRA neck with contrast,  and MRA brain ordered, by Dr Wyn Quaker. 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  FMD not seen  Except in carotids, which was hemondynamically insignficant.  She continues to have nightly headaches.  Has been referred to [facility]  headache specialist .   the headaches are biparietal and pounding in nature. They are not accompanied by  vision changes but  she does experience some dizziness, orthostatic,  which is chronic,.  The  headache resolves after a few hours without intervention or medication .  She has not had a  prior sleep study, but husband says she occasionally snores,   she has no history of bruxism or symptoms thereof.  Has had 3 dreams in which she fainted and wonders is this text is significant .  Her mammogram was  normal in May,  PAP smear was done by Dr. Lula Olszewski at [facility] the summer and was also normal. She had her colonoscopy at age 50 by Dr. Lina Sar which was also normal.    Past Medical History  Diagnosis Date  . Kidney stone   . Hypertension   . Fibromuscular dysplasia of renal artery     managed by Dr. Mel[facility]  . Neuropathy with IgA monoclonal gammopathy     Dr Ellis Savage  . Dysplasia, artery, fibromuscular, renal     Past Surgical History  Procedure Date  .  Cataract extraction Sep 10, 2011, Jan 13,2013  . Renal biopsy   . Vaginal delivery     x2         The following portions of the patient's history were reviewed and updated as appropriate: Allergies, current medications, and problem list.    Review of Systems:   Comprehensive  review of systems was negative except those addressed in the HPI,     History   Social History  . Marital Status: Married    Spouse Name: N/A    Number of Children: 2  . 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 Smoking status: Never Smoker   . Smokeless tobacco: Never Used  . Alcohol Use: No  . Drug Use: No  . Sexually Active: Not on file   Other Topics Concern  . Not on file   Social History Narrative  . No narrative on file    Objective:  BP 112/64  Pulse 61  Temp 97.5 F (36.4 C) (Oral)  Ht 5' 3.5" (1.613 m)  Wt 113 lb 8 oz (51.483 kg)  BMI 19.79 kg/m2  SpO2 97%  General appearance: alert, cooperative and appears stated age Ears: normal TM's and external ear canals both ears Throat: lips, mucosa, and tongue normal; teeth and  gums normal Neck: no adenopathy, no carotid bruit, supple, symmetrical, trachea midline and thyroid not enlarged, symmetric, no tenderness/mass/nodules Back: symmetric, no curvature. ROM normal. No CVA tenderness. Lungs: clear to auscultation bilaterally Heart: regular rate and rhythm, S1, S2 normal, no murmur, click, rub or gallop Abdomen: soft, non-tender; bowel sounds normal; no masses,  no organomegaly Pulses: 2+ and symmetric Skin: Skin color, texture, turgor normal. No rashes or lesions Lymph nodes: Cervical, supraclavicular, and axillary nodes normal.  Assessment and Plan:  Fibromuscular dysplasia of renal artery Renal function has been stable and blood pressure has been well-controlled. No changes today. She is followed by Dr. Festus Barren.  Hypertension Well controlled on  current regimen. Renal function stable, no changes today.  Pernicious anemia Managed with B12 injections monthly.  Unspecified hypothyroidism TSH was normal in April and she has no symptoms of overt under her over active thyroid.  Nocturnal headaches There were no signs of significant stenosis by MRI MRA. She has not had a sleep study yet. She has been referred to [facility] headache specialist. I have offered to get a sleep study done at a time to maximize her visit but she has deferred.   Updated Medication List Outpatient Encounter Prescriptions as of 07/13/2012  Medication Sig Dispense Refill  . adapalene (DIFFERIN) 0.1 % cream Apply topically at bedtime.      Marland Kitchen amLODipine (NORVASC) 2.5 MG tablet Take 1 tablet (2.5 mg total) by mouth daily.  30 tablet  6  . cyanocobalamin (,VITAMIN B-12,) 1000 MCG/ML injection Inject 1 ml into muscle weekly for 3 weeks, then monthly  10 mL  1  . ergocalciferol (DRISDOL) 50000 UNITS capsule Take 1 capsule (50,000 Units total) by mouth once a week.  4 capsule  0  . hydrochlorothiazide (HYDRODIURIL) 25 MG tablet Take 25 mg by mouth daily.      Marland Kitchen levothyroxine (SYNTHROID, LEVOTHROID) 50 MCG tablet Take one tablet daily five days per week and one and one-half tablets two days per week.  Name Brand Only      . propranolol (INDERAL) 10 MG tablet Take 1 tablet (10 mg total) by mouth 2 (two) times daily.  60 tablet  6  . Syringe/Needle, Disp, (SYRINGE 3CC/27GX1-1/4") 27G X 1-1/4" 3 ML MISC For B12 injections  100 each  0  . tretinoin (RETIN-A) 0.025 % cream Apply topically at bedtime.  45 g  3     Orders Placed This Encounter  Procedures  . HM MAMMOGRAPHY  . Flu vaccine greater than or equal to 3yo preservative free IM  . HM PAP SMEAR  . Microalbumin / creatinine urine ratio  . TSH  . Comprehensive metabolic panel    No Follow-up on file.

## 2012-07-14 ENCOUNTER — Encounter: Payer: Self-pay | Admitting: Internal Medicine

## 2012-07-14 DIAGNOSIS — E890 Postprocedural hypothyroidism: Secondary | ICD-10-CM | POA: Insufficient documentation

## 2012-07-14 DIAGNOSIS — R519 Headache, unspecified: Secondary | ICD-10-CM | POA: Insufficient documentation

## 2012-07-14 NOTE — Assessment & Plan Note (Signed)
Renal function has been stable and blood pressure has been well-controlled. No changes today. She is followed by Dr. Jason Dew.   

## 2012-07-14 NOTE — Assessment & Plan Note (Signed)
There were no signs of significant stenosis by MRI MRA. She has not had a sleep study yet. She has been referred to [facility] headache specialist. I have offered to get a sleep study done at a time to maximize her visit but she has deferred.

## 2012-07-14 NOTE — Assessment & Plan Note (Signed)
TSH was normal in April and she has no symptoms of overt under her over active thyroid.

## 2012-07-14 NOTE — Assessment & Plan Note (Signed)
Managed with B12 injections monthly.

## 2012-07-14 NOTE — Assessment & Plan Note (Signed)
Well controlled on current regimen. Renal function stable, no changes today. 

## 2012-08-24 ENCOUNTER — Other Ambulatory Visit: Payer: Self-pay

## 2012-08-24 MED ORDER — TRETINOIN 0.025 % EX CREA: TOPICAL_CREAM | Freq: Every day | CUTANEOUS | Status: AC

## 2012-08-24 NOTE — Telephone Encounter (Signed)
Refill request form [facility] Tretinoin 0.25% ok to refill?

## 2012-09-23 ENCOUNTER — Telehealth: Payer: Self-pay | Admitting: Internal Medicine

## 2012-09-23 DIAGNOSIS — I10 Essential (primary) hypertension: Secondary | ICD-10-CM

## 2012-09-23 MED ORDER — HYDROCHLOROTHIAZIDE 25 MG PO TABS: 25.0000 mg | ORAL_TABLET | Freq: Every day | ORAL | Status: DC

## 2012-09-23 NOTE — Telephone Encounter (Signed)
Refill request for hydrochlorothiazide 25 mg tab #90 Sig: take one tablet every morning

## 2012-09-23 NOTE — Telephone Encounter (Signed)
Med filled.  

## 2012-12-17 ENCOUNTER — Encounter: Payer: BC Managed Care – PPO | Admitting: Internal Medicine

## 2012-12-17 ENCOUNTER — Encounter: Payer: Self-pay | Admitting: Internal Medicine

## 2012-12-17 ENCOUNTER — Ambulatory Visit (INDEPENDENT_AMBULATORY_CARE_PROVIDER_SITE_OTHER): Payer: BC Managed Care – PPO | Admitting: Internal Medicine

## 2012-12-17 VITALS — BP 120/70 | HR 75 | Temp 97.9°F | Resp 16 | Wt 112.5 lb

## 2012-12-17 DIAGNOSIS — N390 Urinary tract infection, site not specified: Secondary | ICD-10-CM

## 2012-12-17 LAB — URINALYSIS, ROUTINE W REFLEX MICROSCOPIC
Bilirubin Urine: NEGATIVE
Ketones, ur: NEGATIVE
Leukocytes, UA: NEGATIVE
Nitrite: POSITIVE
Specific Gravity, Urine: 1.025 (ref 1.000–1.030)
Urine Glucose: NEGATIVE
Urobilinogen, UA: 0.2 (ref 0.0–1.0)
pH: 5.5 (ref 5.0–8.0)

## 2012-12-17 LAB — POCT URINALYSIS DIPSTICK
Glucose, UA: NEGATIVE
Leukocytes, UA: NEGATIVE
Nitrite, UA: POSITIVE
Protein, UA: 30
Spec Grav, UA: 1.025
Urobilinogen, UA: 0.2
pH, UA: 5.5

## 2012-12-17 MED ORDER — CIPROFLOXACIN HCL 250 MG PO TABS: 250.0000 mg | ORAL_TABLET | Freq: Two times a day (BID) | ORAL | Status: DC

## 2012-12-17 NOTE — Patient Instructions (Addendum)
Take 5 days of cirpo,  Save the other 5 days fvor later,     Keep the container closed for next weekend episode and refrigerate sample until   following workday

## 2012-12-18 ENCOUNTER — Encounter: Payer: Self-pay | Admitting: Internal Medicine

## 2012-12-18 DIAGNOSIS — N390 Urinary tract infection, site not specified: Secondary | ICD-10-CM | POA: Insufficient documentation

## 2012-12-18 NOTE — Assessment & Plan Note (Signed)
Suggested by history of low back pain from UA positive for blood and nitrites. Formal UA and culture have been sent. Empiric Cipro for 5 days

## 2012-12-18 NOTE — Progress Notes (Signed)
Patient ID: Hailey Hunt, female   DOB: 1946-08-01, 67 y.o.   MRN: [id number]  Patient Active Problem List  Diagnosis  . Pernicious anemia  . History of transfusion of packed red blood cells  . Fibromuscular dysplasia of renal artery  . Hypertension  . Neuropathy with IgA monoclonal gammopathy  . Vitamin d deficiency  . Tick-borne fever  . Unspecified hypothyroidism  . Nocturnal headaches  . UTI (urinary tract infection)    Subjective:  CC:   Chief Complaint  Patient presents with  . Urinary Tract Infection  . URI    HPI:   Hailey Hunt a 67 y.o. female who presents with a 24-hour history of bilateral lower back pain accompanied by mild dysuria and strong smelling  urine. She is a history of chronic kidney disease and has been told investigate symptoms that might lead to urinary tract infections and loss of kidney function immediately. She denies fevers nausea and suprapubic pain. She has had no recent travel and no recent intercourse.   Past Medical History  Diagnosis Date  . Kidney stone   . Hypertension   . Fibromuscular dysplasia of renal artery     managed by Dr. [facility]  . Neuropathy with IgA monoclonal gammopathy     Dr Ellis Savage  . Dysplasia, artery, fibromuscular, renal     Past Surgical History  Procedure Laterality Date  . Cataract extraction  Sep 10, 2011, Jan 13,2013  . Renal biopsy    . Vaginal delivery      x2     The following portions of the patient's history were reviewed and updated as appropriate: Allergies, current medications, and problem list.    Review of Systems:   Patient denies headache, fevers, malaise, unintentional weight loss, skin rash, eye pain, sinus congestion and sinus pain, sore throat, dysphagia,  hemoptysis , cough, dyspnea, wheezing, chest pain, palpitations, orthopnea, edema, abdominal pain, nausea, melena, diarrhea, constipation,, numbness, tingling, seizures,  Focal weakness, Loss of consciousness,   Tremor, insomnia, depression, anxiety, and suicidal ideation.     History   Social History  . Marital Status: Married    Spouse Name: N/A    Number of Children: 2  . 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 Smoking status: Never Smoker   . Smokeless tobacco: Never Used  . Alcohol Use: No  . Drug Use: No  . Sexually Active: Not on file   Other Topics Concern  . Not on file   Social History Narrative  . No narrative on file    Objective:  BP 120/70  Pulse 75  Temp(Src) 97.9 F (36.6 C) (Oral)  Resp 16  Wt 112 lb 8 oz (51.03 kg)  BMI 19.61 kg/m2  SpO2 98%  General appearance: alert, cooperative and appears stated age Back: symmetric, no curvature. ROM normal. Mild bilateral CVA tenderness. Lungs: clear to auscultation bilaterally Heart: regular rate and rhythm, S1, S2 normal, no murmur, click, rub or gallop Abdomen: soft, non-tender; bowel sounds normal; no masses,  no organomegaly Skin: Skin color, texture, turgor normal. No rashes or lesions Lymph nodes: Cervical, supraclavicular, and axillary nodes normal.  Assessment and Plan:  UTI (urinary tract infection) Suggested by history of low back pain from UA positive for blood and nitrites. Formal UA and culture have been sent. Empiric Cipro for 5 days   Updated Medication List Outpatient Encounter Prescriptions as of 12/17/2012  Medication  Sig Dispense Refill  . adapalene (DIFFERIN) 0.1 % cream Apply topically at bedtime.      Marland Kitchen amLODipine (NORVASC) 2.5 MG tablet Take 1 tablet (2.5 mg total) by mouth daily.  30 tablet  6  . Cholecalciferol (VITAMIN D) 2000 UNITS tablet Take 2,000 Units by mouth daily.      . cyanocobalamin (,VITAMIN B-12,) 1000 MCG/ML injection Inject 1 ml into muscle weekly for 3 weeks, then monthly  10 mL  1  . hydrochlorothiazide (HYDRODIURIL) 25 MG tablet Take 1 tablet (25 mg total) by mouth daily.  90 tablet  0  .  levothyroxine (SYNTHROID, LEVOTHROID) 50 MCG tablet Take one tablet daily five days per week and one and one-half tablets two days per week.  Name Brand Only      . propranolol (INDERAL) 10 MG tablet Take 1 tablet (10 mg total) by mouth 2 (two) times daily.  60 tablet  6  . Syringe/Needle, Disp, (SYRINGE 3CC/27GX1-1/4") 27G X 1-1/4" 3 ML MISC For B12 injections  100 each  0  . tretinoin (RETIN-A) 0.025 % cream Apply topically at bedtime.  45 g  3  . ciprofloxacin (CIPRO) 250 MG tablet Take 1 tablet (250 mg total) by mouth 2 (two) times daily.  20 tablet  0  . [DISCONTINUED] ergocalciferol (DRISDOL) 50000 UNITS capsule Take 1 capsule (50,000 Units total) by mouth once a week.  4 capsule  0   No facility-administered encounter medications on file as of 12/17/2012.     Orders Placed This Encounter  Procedures  . Urine culture  . Urinalysis, Routine w reflex microscopic  . POCT urinalysis dipstick    No Follow-up on file.

## 2012-12-20 LAB — URINE CULTURE: Colony Count: >=100000

## 2012-12-21 ENCOUNTER — Encounter: Payer: Self-pay | Admitting: Internal Medicine

## 2012-12-22 ENCOUNTER — Other Ambulatory Visit: Payer: Self-pay | Admitting: *Deleted

## 2012-12-22 DIAGNOSIS — E039 Hypothyroidism, unspecified: Secondary | ICD-10-CM

## 2012-12-22 DIAGNOSIS — I10 Essential (primary) hypertension: Secondary | ICD-10-CM

## 2012-12-22 MED ORDER — LEVOTHYROXINE SODIUM 50 MCG PO TABS: ORAL_TABLET | ORAL | Status: DC

## 2012-12-22 MED ORDER — AMLODIPINE BESYLATE 2.5 MG PO TABS: 2.5000 mg | ORAL_TABLET | Freq: Every day | ORAL | Status: DC

## 2012-12-22 MED ORDER — HYDROCHLOROTHIAZIDE 25 MG PO TABS: 25.0000 mg | ORAL_TABLET | Freq: Every day | ORAL | Status: DC

## 2012-12-22 NOTE — Telephone Encounter (Signed)
Med filled.  

## 2013-01-10 ENCOUNTER — Encounter: Payer: Self-pay | Admitting: Internal Medicine

## 2013-01-10 ENCOUNTER — Ambulatory Visit (INDEPENDENT_AMBULATORY_CARE_PROVIDER_SITE_OTHER): Payer: BC Managed Care – PPO | Admitting: Internal Medicine

## 2013-01-10 ENCOUNTER — Telehealth: Payer: Self-pay | Admitting: *Deleted

## 2013-01-10 VITALS — BP 108/72 | HR 60 | Temp 98.0°F | Resp 16 | Wt 113.8 lb

## 2013-01-10 DIAGNOSIS — J309 Allergic rhinitis, unspecified: Secondary | ICD-10-CM

## 2013-01-10 DIAGNOSIS — G6189 Other inflammatory polyneuropathies: Secondary | ICD-10-CM

## 2013-01-10 DIAGNOSIS — N390 Urinary tract infection, site not specified: Secondary | ICD-10-CM

## 2013-01-10 DIAGNOSIS — G622 Polyneuropathy due to other toxic agents: Secondary | ICD-10-CM

## 2013-01-10 DIAGNOSIS — Z87442 Personal history of urinary calculi: Secondary | ICD-10-CM

## 2013-01-10 DIAGNOSIS — R609 Edema, unspecified: Secondary | ICD-10-CM | POA: Insufficient documentation

## 2013-01-10 DIAGNOSIS — N3946 Mixed incontinence: Secondary | ICD-10-CM | POA: Insufficient documentation

## 2013-01-10 DIAGNOSIS — D472 Monoclonal gammopathy: Secondary | ICD-10-CM

## 2013-01-10 LAB — POCT URINALYSIS DIPSTICK
Bilirubin, UA: NEGATIVE
Glucose, UA: NEGATIVE
Ketones, UA: NEGATIVE
Leukocytes, UA: NEGATIVE
Nitrite, UA: NEGATIVE
Spec Grav, UA: 1.025
Urobilinogen, UA: 0.2
pH, UA: 6.5

## 2013-01-10 NOTE — Telephone Encounter (Signed)
For this pt it say to collect for a cervical culture? Or did you want it for a urine culture?

## 2013-01-10 NOTE — Assessment & Plan Note (Signed)
Treated with ciprofloxacin .  UA today is normal

## 2013-01-10 NOTE — Assessment & Plan Note (Signed)
followed with serial SPEPs by [facility]

## 2013-01-10 NOTE — Patient Instructions (Addendum)
Consider buying compression knees from [email or web address] (knee high,  Medium strength)  Zyrtec,  Allegra and claritin are all safe to take as long as they do not have a decongestant (pseudoephedrine or phenylephrine) added  If you do not tolerate it ,  We can call in a steroid nasal spray (flonase, nasonex, etc)   Urine culture is pending

## 2013-01-10 NOTE — Assessment & Plan Note (Addendum)
Likely due to  venous insufficiency.  Improved with salt restriction .

## 2013-01-10 NOTE — Telephone Encounter (Signed)
Urine culture.  Thank you

## 2013-01-10 NOTE — Progress Notes (Signed)
Patient ID: Hailey Hunt, female   DOB: 26-Mar-1946, 67 y.o.   MRN: [id number]   Patient Active Problem List  Diagnosis  . Pernicious anemia  . History of transfusion of packed red blood cells  . Fibromuscular dysplasia of renal artery  . Hypertension  . Neuropathy with IgA monoclonal gammopathy  . Vitamin d deficiency  . Tick-borne fever  . Unspecified hypothyroidism  . Nocturnal headaches  . UTI (urinary tract infection)  . History of nephrolithiasis  . Allergic rhinitis  . Edema  . Urinary incontinence, mixed    Subjective:  CC:   Chief Complaint  Patient presents with  . Follow-up    C\O dizziness, allergies, swelling in bilateral legs reported by patient     HPI:   Hailey Hunt a 67 y.o. female who presents with 1) Allergies x 1 month.  Congestion, rhinorrhea, cough and sore throat.   2) Fevers and urinary frequency which started on April 4 treated with ciprofloxacin. Symptoms resolved. Repeat UA due  3) Onset of fluid retention og both lower extremities. Left extremity greater than right.   Started April 1 . She has reduced the sodium in her diet and has been elevating her legs and the edema has resolved.  She has also been noting some fluid retention in his abdomen but has not gained weight.   She has a history of  Fibromuscular dysplasia , IGA gammopathy with mild proteinuria noted by nephrologist,  Dr Austin Miles .  She avoids NSAIDs  bc of htn   Past Medical History  Diagnosis Date  . Kidney stone   . Hypertension   . Fibromuscular dysplasia of renal artery     managed by Dr. [facility]  . Neuropathy with IgA monoclonal gammopathy     Dr Ellis Savage  . Dysplasia, artery, fibromuscular, renal   . Urinary incontinence, mixed     resolved with removal of ained sutures in bladder     Past Surgical History  Procedure Laterality Date  . Cataract extraction  Sep 10, 2011, Jan 13,2013  . Renal biopsy    . Vaginal delivery      x2  . Vaginal hysterectomy          The following portions of the patient's history were reviewed and updated as appropriate: Allergies, current medications, and problem list.    Review of Systems:   12 Pt  review of systems was negative except those addressed in the HPI,     History   Social History  . Marital Status: Married    Spouse Name: N/A    Number of Children: 2  . 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 Smoking status: Never Smoker   . Smokeless tobacco: Never Used  . Alcohol Use: No  . Drug Use: No  . Sexually Active: Not on file   Other Topics Concern  . Not on file   Social History Narrative  . No narrative on file    Objective:  BP 108/72  Pulse 60  Temp(Src) 98 F (36.7 C) (Oral)  Resp 16  Wt 113 lb 12 oz (51.597 kg)  BMI 19.83 kg/m2  SpO2 98%  General appearance: alert, cooperative and appears stated age Ears: normal TM's and external ear canals both ears Throat: lips, mucosa, and tongue normal; teeth and gums normal Neck: no adenopathy, no carotid bruit, supple, symmetrical, trachea midline and thyroid not enlarged, symmetric,  no tenderness/mass/nodules Back: symmetric, no curvature. ROM normal. No CVA tenderness. Lungs: clear to auscultation bilaterally Heart: regular rate and rhythm, S1, S2 normal, no murmur, click, rub or gallop Abdomen: soft, non-tender; bowel sounds normal; no masses,  no organomegaly Pulses: 2+ and symmetric Skin: Skin color, texture, turgor normal. No rashes or lesions Lymph nodes: Cervical, supraclavicular, and axillary nodes normal.  Assessment and Plan:  UTI (urinary tract infection) Treated with ciprofloxacin .  UA today is normal   Neuropathy with IgA monoclonal gammopathy followed with serial SPEPs by [facility]  Allergic rhinitis Discussed use of antihistamines without decongestants to manage symptoms and use of saline lavage for pollen  removal  Edema Likely due to  venous insufficiency.  Improved with salt restriction .    Updated Medication List Outpatient Encounter Prescriptions as of 01/10/2013  Medication Sig Dispense Refill  . amLODipine (NORVASC) 2.5 MG tablet Take 1 tablet (2.5 mg total) by mouth daily.  30 tablet  6  . Cholecalciferol (VITAMIN D) 2000 UNITS tablet Take 2,000 Units by mouth daily.      . cyanocobalamin (,VITAMIN B-12,) 1000 MCG/ML injection Inject 1 ml into muscle weekly for 3 weeks, then monthly  10 mL  1  . hydrochlorothiazide (HYDRODIURIL) 25 MG tablet Take 1 tablet (25 mg total) by mouth daily.  30 tablet  6  . levothyroxine (SYNTHROID, LEVOTHROID) 50 MCG tablet Take one tablet daily five days per week and one and one-half tablets two days per week.  Name Brand Only  32 tablet  6  . propranolol (INDERAL) 10 MG tablet Take 1 tablet (10 mg total) by mouth 2 (two) times daily.  60 tablet  6  . Syringe/Needle, Disp, (SYRINGE 3CC/27GX1-1/4") 27G X 1-1/4" 3 ML MISC For B12 injections  100 each  0  . tretinoin (RETIN-A) 0.025 % cream Apply topically at bedtime.  45 g  3  . adapalene (DIFFERIN) 0.1 % cream Apply topically at bedtime.      . ciprofloxacin (CIPRO) 250 MG tablet Take 1 tablet (250 mg total) by mouth 2 (two) times daily.  20 tablet  0   No facility-administered encounter medications on file as of 01/10/2013.     Orders Placed This Encounter  Procedures  . Cervical culture  . Urine culture  . POCT urinalysis dipstick    No Follow-up on file.

## 2013-01-10 NOTE — Assessment & Plan Note (Signed)
Discussed use of antihistamines without decongestants to manage symptoms and use of saline lavage for pollen removal

## 2013-01-12 ENCOUNTER — Encounter: Payer: Self-pay | Admitting: Internal Medicine

## 2013-01-12 LAB — URINE CULTURE
Colony Count: NO GROWTH
Organism ID, Bacteria: NO GROWTH

## 2013-01-19 ENCOUNTER — Other Ambulatory Visit: Payer: Self-pay | Admitting: *Deleted

## 2013-01-19 DIAGNOSIS — R51 Headache: Secondary | ICD-10-CM

## 2013-01-19 MED ORDER — PROPRANOLOL HCL 10 MG PO TABS: 10.0000 mg | ORAL_TABLET | Freq: Two times a day (BID) | ORAL | Status: DC

## 2013-01-19 NOTE — Telephone Encounter (Signed)
Rx sent to pharmacy by escript  

## 2013-01-26 ENCOUNTER — Encounter: Payer: Self-pay | Admitting: Internal Medicine

## 2013-01-26 DIAGNOSIS — Z1239 Encounter for other screening for malignant neoplasm of breast: Secondary | ICD-10-CM

## 2013-02-11 NOTE — Telephone Encounter (Signed)
Open in my chart no action to date forwarded to MD  for order so I can fax.

## 2013-02-27 LAB — HM MAMMOGRAPHY: HM Mammogram: NORMAL

## 2013-02-28 ENCOUNTER — Encounter: Payer: Self-pay | Admitting: Internal Medicine

## 2013-02-28 ENCOUNTER — Other Ambulatory Visit (INDEPENDENT_AMBULATORY_CARE_PROVIDER_SITE_OTHER): Payer: BC Managed Care – PPO

## 2013-02-28 DIAGNOSIS — N390 Urinary tract infection, site not specified: Secondary | ICD-10-CM

## 2013-02-28 LAB — POCT URINALYSIS DIPSTICK
Bilirubin, UA: NEGATIVE
Glucose, UA: NEGATIVE
Ketones, UA: NEGATIVE
Nitrite, UA: POSITIVE
Protein, UA: 30
Spec Grav, UA: 1.025
Urobilinogen, UA: 0.2
pH, UA: 6

## 2013-02-28 MED ORDER — CIPROFLOXACIN HCL 250 MG PO TABS: 250.0000 mg | ORAL_TABLET | Freq: Two times a day (BID) | ORAL | Status: DC

## 2013-03-02 ENCOUNTER — Encounter: Payer: Self-pay | Admitting: Internal Medicine

## 2013-03-02 LAB — URINE CULTURE: Colony Count: >=100000

## 2013-03-03 ENCOUNTER — Encounter: Payer: Self-pay | Admitting: Internal Medicine

## 2013-04-12 ENCOUNTER — Encounter: Payer: Self-pay | Admitting: Internal Medicine

## 2013-06-06 ENCOUNTER — Ambulatory Visit: Payer: Self-pay | Admitting: Urology

## 2013-07-04 ENCOUNTER — Other Ambulatory Visit: Payer: Self-pay | Admitting: Internal Medicine

## 2013-07-12 NOTE — Progress Notes (Signed)
This encounter was created in error - please disregard.

## 2013-08-04 ENCOUNTER — Other Ambulatory Visit: Payer: Self-pay

## 2013-08-29 ENCOUNTER — Ambulatory Visit (INDEPENDENT_AMBULATORY_CARE_PROVIDER_SITE_OTHER): Payer: Medicare Other | Admitting: Internal Medicine

## 2013-08-29 ENCOUNTER — Encounter: Payer: Self-pay | Admitting: Internal Medicine

## 2013-08-29 ENCOUNTER — Other Ambulatory Visit: Payer: Self-pay | Admitting: Internal Medicine

## 2013-08-29 ENCOUNTER — Encounter: Payer: Self-pay | Admitting: Emergency Medicine

## 2013-08-29 VITALS — BP 102/64 | HR 62 | Temp 97.4°F | Resp 12 | Ht 63.0 in | Wt 113.5 lb

## 2013-08-29 DIAGNOSIS — D510 Vitamin B12 deficiency anemia due to intrinsic factor deficiency: Secondary | ICD-10-CM

## 2013-08-29 DIAGNOSIS — I10 Essential (primary) hypertension: Secondary | ICD-10-CM

## 2013-08-29 DIAGNOSIS — R0600 Dyspnea, unspecified: Secondary | ICD-10-CM

## 2013-08-29 DIAGNOSIS — E785 Hyperlipidemia, unspecified: Secondary | ICD-10-CM

## 2013-08-29 DIAGNOSIS — Z8701 Personal history of pneumonia (recurrent): Secondary | ICD-10-CM

## 2013-08-29 DIAGNOSIS — E039 Hypothyroidism, unspecified: Secondary | ICD-10-CM

## 2013-08-29 DIAGNOSIS — I773 Arterial fibromuscular dysplasia: Secondary | ICD-10-CM

## 2013-08-29 DIAGNOSIS — Z6379 Other stressful life events affecting family and household: Secondary | ICD-10-CM

## 2013-08-29 DIAGNOSIS — E538 Deficiency of other specified B group vitamins: Secondary | ICD-10-CM

## 2013-08-29 DIAGNOSIS — R51 Headache: Secondary | ICD-10-CM

## 2013-08-29 DIAGNOSIS — E559 Vitamin D deficiency, unspecified: Secondary | ICD-10-CM | POA: Diagnosis not present

## 2013-08-29 DIAGNOSIS — N390 Urinary tract infection, site not specified: Secondary | ICD-10-CM

## 2013-08-29 DIAGNOSIS — R0609 Other forms of dyspnea: Secondary | ICD-10-CM

## 2013-08-29 DIAGNOSIS — Z812 Family history of tobacco abuse and dependence: Secondary | ICD-10-CM

## 2013-08-29 DIAGNOSIS — I7789 Other specified disorders of arteries and arterioles: Secondary | ICD-10-CM

## 2013-08-29 LAB — COMPREHENSIVE METABOLIC PANEL
ALT: 19 U/L (ref 0–35)
AST: 25 U/L (ref 0–37)
Albumin: 4.2 g/dL (ref 3.5–5.2)
Alkaline Phosphatase: 91 U/L (ref 39–117)
BUN: 17 mg/dL (ref 6–23)
CO2: 33 mEq/L — ABNORMAL HIGH (ref 19–32)
Calcium: 9.9 mg/dL (ref 8.4–10.5)
Chloride: 101 mEq/L (ref 96–112)
Creatinine, Ser: 1 mg/dL (ref 0.4–1.2)
GFR: 57.15 mL/min — ABNORMAL LOW (ref >60.00)
Glucose, Bld: 92 mg/dL (ref 70–99)
Potassium: 4 mEq/L (ref 3.5–5.1)
Sodium: 140 mEq/L (ref 135–145)
Total Bilirubin: 0.7 mg/dL (ref 0.3–1.2)
Total Protein: 6.9 g/dL (ref 6.0–8.3)

## 2013-08-29 LAB — CBC WITH DIFFERENTIAL/PLATELET
Basophils Absolute: 0 10*3/uL (ref 0.0–0.1)
Basophils Relative: 0.6 % (ref 0.0–3.0)
Eosinophils Absolute: 0.2 10*3/uL (ref 0.0–0.7)
Eosinophils Relative: 2.4 % (ref 0.0–5.0)
HCT: 37.3 % (ref 36.0–46.0)
Hemoglobin: 12.4 g/dL (ref 12.0–15.0)
Lymphocytes Relative: 24.9 % (ref 12.0–46.0)
Lymphs Abs: 1.6 10*3/uL (ref 0.7–4.0)
MCHC: 33.3 g/dL (ref 30.0–36.0)
MCV: 85.9 fl (ref 78.0–100.0)
Monocytes Absolute: 0.3 10*3/uL (ref 0.1–1.0)
Monocytes Relative: 5.1 % (ref 3.0–12.0)
Neutro Abs: 4.4 10*3/uL (ref 1.4–7.7)
Neutrophils Relative %: 67 % (ref 43.0–77.0)
Platelets: 308 10*3/uL (ref 150.0–400.0)
RBC: 4.34 Mil/uL (ref 3.87–5.11)
RDW: 14.6 % (ref 11.5–14.6)
WBC: 6.6 10*3/uL (ref 4.5–10.5)

## 2013-08-29 LAB — LIPID PANEL
Cholesterol: 216 mg/dL — ABNORMAL HIGH (ref 0–200)
HDL: 80.8 mg/dL (ref >39.00)
Total CHOL/HDL Ratio: 3
Triglycerides: 58 mg/dL (ref 0.0–149.0)
VLDL: 11.6 mg/dL (ref 0.0–40.0)

## 2013-08-29 LAB — LDL CHOLESTEROL, DIRECT: Direct LDL: 124 mg/dL

## 2013-08-29 LAB — VITAMIN B12: Vitamin B-12: 181 pg/mL — ABNORMAL LOW (ref 211–911)

## 2013-08-29 LAB — TSH: TSH: 0.53 u[IU]/mL (ref 0.35–5.50)

## 2013-08-29 NOTE — Progress Notes (Signed)
Patient ID: Hailey Hunt, female   DOB: 05/29/1946, 67 y.o.   MRN: [id number]   Patient Active Problem List   Diagnosis Date Noted  . History of pneumonia 08/29/2013  . History of nephrolithiasis 01/10/2013  . Allergic rhinitis 01/10/2013  . Edema 01/10/2013  . Urinary incontinence, mixed   . UTI (urinary tract infection) 12/18/2012  . Unspecified hypothyroidism 07/14/2012  . Nocturnal headaches 07/14/2012  . 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 Vitamin D deficiency 01/21/2012  . Neuropathy with IgA monoclonal gammopathy   . Pernicious anemia 01/14/2012  . History of transfusion of packed red blood cells 01/14/2012  . Hypertension 01/14/2012  . Fibromuscular dysplasia of renal artery     Subjective:  CC:   Chief Complaint  Patient presents with  . Follow-up    medication refills    HPI:   Hailey Hunt a 67 y.o. female who presents 6 month follow up on chronic conditions including FMD of renal arteries, recurrent cystitis.  She has been maintained on  prophylactic keflex 250 mg daily since august and since then has not had a UTI .  Dr. Lula Olszewski  found sutures that had  migrated on cystoscopy and have been presumed to be the source of her recurrent infections.  The sutures have aleady trimmed once.   She is concerned about developing COPD from 50 yrs of passive smoke exposure and is requesting a chest CT at some point.  She denies cough, but has exertional dyspnea that prevents her from exercising vigorously     Past Medical History  Diagnosis Date  . Kidney stone   . Hypertension   . Fibromuscular dysplasia of renal artery     managed by Dr. Melvern [facility]  . Neuropathy with IgA monoclonal gammopathy     Dr Ellis Savage  . Dysplasia, artery, fibromuscular, renal   . Urinary incontinence, mixed     resolved with removal of ained sutures in bladder     Past Surgical History  Procedure Laterality Date  . Cataract extraction  Sep 10, 2011, Jan 13,2013  . Renal biopsy     . Vaginal delivery      x2  . Vaginal hysterectomy         The following portions of the patient's history were reviewed and updated as appropriate: Allergies, current medications, and problem list.    Review of Systems:   12 Pt  review of systems was negative except those addressed in the HPI,     History   Social History  . Marital Status: Married    Spouse Name: N/A    Number of Children: 2  . 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 Smoking status: Never Smoker   . Smokeless tobacco: Never Used  . Alcohol Use: No  . Drug Use: No  . Sexual Activity: Not Currently   Other Topics Concern  . Not on file   Social History Narrative  . No narrative on file    Objective:  Filed Vitals:   08/29/13 0804  BP: 102/64  Pulse: 62  Temp: 97.4 F (36.3 C)  Resp: 12     General appearance: alert, cooperative and appears stated age Ears: normal TM's and external ear canals both ears Throat: lips, mucosa, and tongue normal; teeth and gums normal Neck: no adenopathy, no carotid bruit, supple, symmetrical, trachea midline and thyroid not enlarged, symmetric, no tenderness/mass/nodules Back: symmetric, no curvature.  ROM normal. No CVA tenderness. Lungs: clear to auscultation bilaterally Heart: regular rate and rhythm, S1, S2 normal, no murmur, click, rub or gallop Abdomen: soft, non-tender; bowel sounds normal; no masses,  no organomegaly Pulses: 2+ and symmetric Skin: Skin color, texture, turgor normal. No rashes or lesions Lymph nodes: Cervical, supraclavicular, and axillary nodes normal.  Assessment and Plan:  Vitamin D deficiency Level was low at 21 last April.  Rechecking today   UTI (urinary tract infection) Now in remission with prophylactic antibiotics.   Pernicious anemia CBC is normal,,  b12 deficiency was managed with monthly b12 injections but she has not had any in several months.   level today was low.     Hypertension Well controlled on current regimen. Renal function stable, no changes today.  Fibromuscular dysplasia of renal artery Renal function has been stable and blood pressure has been well-controlled. No changes today. She is followed by Dr. Festus Barren.    Family history of tobacco abuse Patient requesting ct of chest without contrast   Updated Medication List Outpatient Encounter Prescriptions as of 08/29/2013  Medication Sig  . adapalene (DIFFERIN) 0.1 % cream Apply topically at bedtime.  . cephALEXin (KEFLEX) 250 MG capsule Take 250 mg by mouth daily.  . Cholecalciferol (VITAMIN D) 2000 UNITS tablet Take 2,000 Units by mouth daily.  . cyanocobalamin (,VITAMIN B-12,) 1000 MCG/ML injection Inject 1 ml into muscle weekly for 3 weeks, then monthly  . levothyroxine (SYNTHROID, LEVOTHROID) 50 MCG tablet Take one tablet daily five days per week and one and one-half tablets two days per week.  Name Brand Only  . propranolol (INDERAL) 10 MG tablet TAKE ONE TABLET TWICE A DAY  . Syringe/Needle, Disp, (SYRINGE 3CC/27GX1-1/4") 27G X 1-1/4" 3 ML MISC For B12 injections  . traMADol (ULTRAM) 50 MG tablet Take 50 mg by mouth every 6 (six) hours as needed.  . [DISCONTINUED] amLODipine (NORVASC) 2.5 MG tablet TAKE 1 TABLET EVERY DAY  . [DISCONTINUED] hydrochlorothiazide (HYDRODIURIL) 25 MG tablet TAKE ONE TABLET EVERY MORNING  . [DISCONTINUED] ciprofloxacin (CIPRO) 250 MG tablet Take 1 tablet (250 mg total) by mouth 2 (two) times daily.

## 2013-08-29 NOTE — Assessment & Plan Note (Signed)
Level was low at 21 last April.  Rechecking today

## 2013-08-29 NOTE — Assessment & Plan Note (Signed)
Patient requesting ct of chest without contrast

## 2013-08-29 NOTE — Assessment & Plan Note (Signed)
Now in remission with prophylactic antibiotics.

## 2013-08-29 NOTE — Assessment & Plan Note (Addendum)
CBC is normal,,  b12 deficiency was managed with monthly b12 injections but she has not had any in several months.  level today was low.

## 2013-08-29 NOTE — Assessment & Plan Note (Signed)
Well controlled on current regimen. Renal function stable, no changes today. 

## 2013-08-29 NOTE — Patient Instructions (Signed)
You do not need an appointment (Per Dr Darrick Huntsman) for treatment of UTI due to your history  You will just need to drop off a urine specimen  for UA and culture  We will order your CT scan of the chest after the holidays

## 2013-08-29 NOTE — Progress Notes (Signed)
Pre-visit discussion using our clinic review tool. No additional management support is needed unless otherwise documented below in the visit note.  

## 2013-08-29 NOTE — Assessment & Plan Note (Signed)
Renal function has been stable and blood pressure has been well-controlled. No changes today. She is followed by Dr. Festus Barren.

## 2013-08-30 ENCOUNTER — Encounter: Payer: Self-pay | Admitting: Internal Medicine

## 2013-08-30 LAB — VITAMIN D 25 HYDROXY (VIT D DEFICIENCY, FRACTURES): Vit D, 25-Hydroxy: 62 ng/mL (ref 30–89)

## 2013-08-31 ENCOUNTER — Telehealth: Payer: Self-pay | Admitting: Internal Medicine

## 2013-08-31 DIAGNOSIS — R51 Headache: Secondary | ICD-10-CM

## 2013-08-31 MED ORDER — TRAMADOL HCL 50 MG PO TABS: 50.0000 mg | ORAL_TABLET | Freq: Four times a day (QID) | ORAL | Status: DC | PRN

## 2013-08-31 MED ORDER — CYANOCOBALAMIN 1000 MCG/ML IJ SOLN: INTRAMUSCULAR | Status: DC

## 2013-08-31 MED ORDER — TRAMADOL HCL 50 MG PO TABS: 50.0000 mg | ORAL_TABLET | Freq: Three times a day (TID) | ORAL | Status: DC | PRN

## 2013-08-31 NOTE — Telephone Encounter (Signed)
Tramadol and B12 injectable not sent to pharmacy.  Pt seen yesterday.

## 2013-08-31 NOTE — Telephone Encounter (Signed)
Rx faxed

## 2013-08-31 NOTE — Telephone Encounter (Signed)
printed

## 2013-08-31 NOTE — Telephone Encounter (Signed)
Please advise as to fill for Tramadol?

## 2013-09-07 ENCOUNTER — Other Ambulatory Visit: Payer: Self-pay | Admitting: Internal Medicine

## 2013-09-07 ENCOUNTER — Other Ambulatory Visit: Payer: Medicare Other

## 2013-09-07 DIAGNOSIS — I7789 Other specified disorders of arteries and arterioles: Secondary | ICD-10-CM

## 2013-09-07 DIAGNOSIS — I773 Arterial fibromuscular dysplasia: Secondary | ICD-10-CM

## 2013-09-08 ENCOUNTER — Encounter: Payer: Self-pay | Admitting: Internal Medicine

## 2013-09-08 LAB — MICROALBUMIN / CREATININE URINE RATIO
Creatinine,U: 56.5 mg/dL
Microalb Creat Ratio: 0.4 mg/g (ref 0.0–30.0)
Microalb, Ur: 0.2 mg/dL (ref 0.0–1.9)

## 2013-09-15 DIAGNOSIS — N182 Chronic kidney disease, stage 2 (mild): Secondary | ICD-10-CM | POA: Diagnosis not present

## 2013-09-15 DIAGNOSIS — I129 Hypertensive chronic kidney disease with stage 1 through stage 4 chronic kidney disease, or unspecified chronic kidney disease: Secondary | ICD-10-CM | POA: Diagnosis not present

## 2013-09-16 DIAGNOSIS — Z0000 Encounter for general adult medical examination without abnormal findings: Secondary | ICD-10-CM | POA: Diagnosis not present

## 2013-10-03 ENCOUNTER — Other Ambulatory Visit: Payer: Self-pay | Admitting: Internal Medicine

## 2013-10-03 NOTE — Telephone Encounter (Signed)
Ok to refill medication 

## 2013-10-04 ENCOUNTER — Ambulatory Visit: Payer: Self-pay | Admitting: Internal Medicine

## 2013-10-04 DIAGNOSIS — R0602 Shortness of breath: Secondary | ICD-10-CM | POA: Diagnosis not present

## 2013-10-04 DIAGNOSIS — R0609 Other forms of dyspnea: Secondary | ICD-10-CM | POA: Diagnosis not present

## 2013-10-04 NOTE — Telephone Encounter (Signed)
Ok to refill,  Refill sent  

## 2013-10-05 ENCOUNTER — Telehealth: Payer: Self-pay | Admitting: Internal Medicine

## 2013-10-05 NOTE — Telephone Encounter (Signed)
Her noncontrasted CT of the chest showed no lung disease

## 2013-10-05 NOTE — Telephone Encounter (Signed)
Patient notified as requested. 

## 2013-11-03 ENCOUNTER — Encounter: Payer: Self-pay | Admitting: Internal Medicine

## 2013-11-18 DIAGNOSIS — H60509 Unspecified acute noninfective otitis externa, unspecified ear: Secondary | ICD-10-CM | POA: Diagnosis not present

## 2013-11-18 DIAGNOSIS — H93299 Other abnormal auditory perceptions, unspecified ear: Secondary | ICD-10-CM | POA: Diagnosis not present

## 2014-02-21 ENCOUNTER — Other Ambulatory Visit: Payer: Self-pay | Admitting: Internal Medicine

## 2014-03-02 DIAGNOSIS — Z1231 Encounter for screening mammogram for malignant neoplasm of breast: Secondary | ICD-10-CM | POA: Diagnosis not present

## 2014-03-06 LAB — HM MAMMOGRAPHY: HM Mammogram: NORMAL

## 2014-03-27 ENCOUNTER — Encounter: Payer: Self-pay | Admitting: Internal Medicine

## 2014-04-03 DIAGNOSIS — I10 Essential (primary) hypertension: Secondary | ICD-10-CM | POA: Diagnosis not present

## 2014-04-04 DIAGNOSIS — N182 Chronic kidney disease, stage 2 (mild): Secondary | ICD-10-CM | POA: Diagnosis not present

## 2014-04-04 DIAGNOSIS — I129 Hypertensive chronic kidney disease with stage 1 through stage 4 chronic kidney disease, or unspecified chronic kidney disease: Secondary | ICD-10-CM | POA: Diagnosis not present

## 2014-06-16 ENCOUNTER — Other Ambulatory Visit: Payer: Self-pay | Admitting: Internal Medicine

## 2014-07-04 DIAGNOSIS — I7789 Other specified disorders of arteries and arterioles: Secondary | ICD-10-CM | POA: Diagnosis not present

## 2014-07-04 DIAGNOSIS — I773 Arterial fibromuscular dysplasia: Secondary | ICD-10-CM | POA: Diagnosis not present

## 2014-07-06 ENCOUNTER — Ambulatory Visit (INDEPENDENT_AMBULATORY_CARE_PROVIDER_SITE_OTHER): Payer: Medicare Other | Admitting: Internal Medicine

## 2014-07-06 ENCOUNTER — Encounter: Payer: Self-pay | Admitting: Internal Medicine

## 2014-07-06 VITALS — BP 104/62 | HR 51 | Temp 97.6°F | Resp 14 | Ht 63.0 in | Wt 104.5 lb

## 2014-07-06 DIAGNOSIS — Z23 Encounter for immunization: Secondary | ICD-10-CM | POA: Diagnosis not present

## 2014-07-06 DIAGNOSIS — R51 Headache: Secondary | ICD-10-CM

## 2014-07-06 DIAGNOSIS — I150 Renovascular hypertension: Secondary | ICD-10-CM | POA: Diagnosis not present

## 2014-07-06 DIAGNOSIS — E038 Other specified hypothyroidism: Secondary | ICD-10-CM

## 2014-07-06 DIAGNOSIS — D510 Vitamin B12 deficiency anemia due to intrinsic factor deficiency: Secondary | ICD-10-CM | POA: Diagnosis not present

## 2014-07-06 DIAGNOSIS — R634 Abnormal weight loss: Secondary | ICD-10-CM | POA: Diagnosis not present

## 2014-07-06 DIAGNOSIS — E034 Atrophy of thyroid (acquired): Secondary | ICD-10-CM | POA: Diagnosis not present

## 2014-07-06 DIAGNOSIS — R519 Headache, unspecified: Secondary | ICD-10-CM

## 2014-07-06 DIAGNOSIS — E559 Vitamin D deficiency, unspecified: Secondary | ICD-10-CM

## 2014-07-06 DIAGNOSIS — Z1211 Encounter for screening for malignant neoplasm of colon: Secondary | ICD-10-CM

## 2014-07-06 LAB — COMPREHENSIVE METABOLIC PANEL
ALT: 13 U/L (ref 0–35)
AST: 21 U/L (ref 0–37)
Albumin: 3.9 g/dL (ref 3.5–5.2)
Alkaline Phosphatase: 88 U/L (ref 39–117)
BUN: 15 mg/dL (ref 6–23)
CO2: 28 mEq/L (ref 19–32)
Calcium: 9.9 mg/dL (ref 8.4–10.5)
Chloride: 101 mEq/L (ref 96–112)
Creatinine, Ser: 1 mg/dL (ref 0.4–1.2)
GFR: 59.66 mL/min — ABNORMAL LOW (ref >60.00)
Glucose, Bld: 89 mg/dL (ref 70–99)
Potassium: 3.9 mEq/L (ref 3.5–5.1)
Sodium: 141 mEq/L (ref 135–145)
Total Bilirubin: 0.9 mg/dL (ref 0.2–1.2)
Total Protein: 7.5 g/dL (ref 6.0–8.3)

## 2014-07-06 MED ORDER — PREDNISONE (PAK) 10 MG PO TABS: ORAL_TABLET | ORAL | Status: DC

## 2014-07-06 NOTE — Patient Instructions (Addendum)
Reduce inderal to 1/2 tablet twice daily until Sunday .  If bp is still < 120 and pulse still < 70,  Stop  The hctz   You received the pneumonia vaccine today.  Go home and take 1000 mg tylenol once .  Start the prednisone taper if you develop a systemic reactio like you did the last time  Colonoscopy referral in process     .

## 2014-07-06 NOTE — Progress Notes (Signed)
Patient ID: Hailey Hunt, female   DOB: 13-Jun-1946, 68 y.o.   MRN: [id number]   Patient Active Problem List   Diagnosis Date Noted  . History of pneumonia 08/29/2013  . Family history of tobacco abuse 08/29/2013  . Exertional dyspnea 08/29/2013  . History of nephrolithiasis 01/10/2013  . Allergic rhinitis 01/10/2013  . Edema 01/10/2013  . Urinary incontinence, mixed   . Hypothyroidism 07/14/2012  . Nocturnal headaches 07/14/2012  . 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 Vitamin D deficiency 01/21/2012  . Neuropathy with IgA monoclonal gammopathy   . Pernicious anemia 01/14/2012  . History of transfusion of packed red blood cells 01/14/2012  . Hypertension 01/14/2012  . Fibromuscular dysplasia of renal artery     Subjective:  CC:   Chief Complaint  Patient presents with  . Follow-up    HPI:   Hailey Hunt is a 68 y.o. female who presents for follow up on chronic conditions including hypertension, hypothyroidism.    Hypertension,  Has been recurrenlty orthostatic despite stopping amlodipine,  still taking hctz and inderal 10 mg bid.  Accompanied by unintentional wt loss.  Good appetite,  Feeling tired after one flight of stairs, and short of breath.  Has nocturia x 3 secondary to OAB.    Needs colonoscpy  Past Medical History  Diagnosis Date  . Kidney stone   . Hypertension   . Fibromuscular dysplasia of renal artery     managed by Dr. Gregary Signs [facility]  . Neuropathy with IgA monoclonal gammopathy     Dr Mervyn Skeeters  . Dysplasia, artery, fibromuscular, renal   . Urinary incontinence, mixed     resolved with removal of ained sutures in bladder     Past Surgical History  Procedure Laterality Date  . Cataract extraction  Sep 10, 2011, Jan 13,2013  . Renal biopsy    . Vaginal delivery      x2  . Vaginal hysterectomy         The following portions of the patient's history were reviewed and updated as appropriate: Allergies, current medications, and problem  list.    Review of Systems:   Patient denies headache, fevers, malaise, unintentional weight loss, skin rash, eye pain, sinus congestion and sinus pain, sore throat, dysphagia,  hemoptysis , cough, dyspnea, wheezing, chest pain, palpitations, orthopnea, edema, abdominal pain, nausea, melena, diarrhea, constipation, flank pain, dysuria, hematuria, urinary  Frequency, nocturia, numbness, tingling, seizures,  Focal weakness, Loss of consciousness,  Tremor, insomnia, depression, anxiety, and suicidal ideation.     History   Social History  . Marital Status: Married    Spouse Name: N/A    Number of Children: 2  . 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 Smoking status: Never Smoker   . Smokeless tobacco: Never Used  . Alcohol Use: No  . Drug Use: No  . Sexual Activity: Not Currently   Other Topics Concern  . Not on file   Social History Narrative  . No narrative on file    Objective:  Filed Vitals:   07/06/14 1021  BP: 104/62  Pulse: 51  Temp: 97.6 F (36.4 C)  Resp: 14     General appearance: alert, cooperative and appears stated age Ears: normal TM's and external ear canals both ears Throat: lips, mucosa, and tongue normal; teeth and gums normal Neck: no adenopathy, no carotid bruit, supple, symmetrical, trachea midline and thyroid not enlarged, symmetric,  no tenderness/mass/nodules Back: symmetric, no curvature. ROM normal. No CVA tenderness. Lungs: clear to auscultation bilaterally Heart: regular rate and rhythm, S1, S2 normal, no murmur, click, rub or gallop Abdomen: soft, non-tender; bowel sounds normal; no masses,  no organomegaly Pulses: 2+ and symmetric Skin: Skin color, texture, turgor normal. No rashes or lesions Lymph nodes: Cervical, supraclavicular, and axillary nodes normal.  Assessment and Plan:  Hypertension Improved, with orthostasis today .  Decreased indeeral to 1/2 dose,  If  persistent will stop hctz.   Lab Results  Component Value Date   CREATININE 1.0 07/06/2014   Lab Results  Component Value Date   NA 141 07/06/2014   K 3.9 07/06/2014   CL 101 07/06/2014   CO2 28 07/06/2014     Hypothyroidism Thyroid function is overactive on current dose.  I have sent a lower dose of levothyroxine to her pharmacy and would like patient to change immediatley. Will repeat th in [facility] Lab Results  Component Value Date   TSH 0.213* 07/06/2014     Pernicious anemia CBC is normal,,  b12 deficiency was managed with monthly b12 injections  Lab Results  Component Value Date   VITAMINB12 181* 08/29/2013   Lab Results  Component Value Date   WBC 6.6 08/29/2013   HGB 12.4 08/29/2013   HCT 37.3 08/29/2013   MCV 85.9 08/29/2013   PLT 308.0 08/29/2013     Nocturnal headaches Resolved with low dose inderal.    Updated Medication List Outpatient Encounter Prescriptions as of 07/06/2014  Medication Sig  . adapalene (DIFFERIN) 0.1 % cream Apply topically at bedtime.  . cephALEXin (KEFLEX) 250 MG capsule Take 250 mg by mouth daily.  . Cholecalciferol (VITAMIN D) 2000 UNITS tablet Take 2,000 Units by mouth daily.  . cyanocobalamin (,VITAMIN B-12,) 1000 MCG/ML injection Inject 1 ml into muscle weekly for 3 weeks, then monthly  . hydrochlorothiazide (HYDRODIURIL) 25 MG tablet TAKE ONE TABLET EVERY MORNING  . levothyroxine (SYNTHROID, LEVOTHROID) 25 MCG tablet Take 1 tablet (25 mcg total) by mouth daily before breakfast.  . propranolol (INDERAL) 10 MG tablet Take 0.5 tablets (5 mg total) by mouth 2 (two) times daily.  . Syringe/Needle, Disp, (SYRINGE 3CC/27GX1-1/4") 27G X 1-1/4" 3 ML MISC For B12 injections  . traMADol (ULTRAM) 50 MG tablet Take 1 tablet (50 mg total) by mouth every 8 (eight) hours as needed.  . traMADol (ULTRAM) 50 MG tablet Take 1 tablet (50 mg total) by mouth every 6 (six) hours as needed.  . [DISCONTINUED] propranolol (INDERAL) 10 MG tablet TAKE ONE TABLET  TWICE A DAY  . [DISCONTINUED] SYNTHROID 50 MCG tablet TAKE ONE TABLET DAILY FIVE DAYS PER WEEK AND ONE AND ONE-HALF TABLETSTWO DAYS PER WEEK  . predniSONE (STERAPRED UNI-PAK) 10 MG tablet 6 tablets on Day 1 , then reduce by 1 tablet daily until gone  . [DISCONTINUED] amLODipine (NORVASC) 2.5 MG tablet TAKE 1 TABLET EVERY DAY     Orders Placed This Encounter  Procedures  . [facility]  . Pneumococcal polysaccharide vaccine 23-valent greater than or equal to 2yo subcutaneous/IM  . T4 AND TSH  . Comp Met (CMET)  . Ambulatory referral to Gastroenterology    No Follow-up on file.

## 2014-07-07 LAB — T4 AND TSH
T4, Total: 12.7 ug/dL — ABNORMAL HIGH (ref 4.5–12.0)
TSH: 0.213 u[IU]/mL — ABNORMAL LOW (ref 0.450–4.500)

## 2014-07-09 ENCOUNTER — Encounter: Payer: Self-pay | Admitting: Internal Medicine

## 2014-07-09 MED ORDER — PROPRANOLOL HCL 10 MG PO TABS: 5.0000 mg | ORAL_TABLET | Freq: Two times a day (BID) | ORAL | Status: DC

## 2014-07-09 MED ORDER — LEVOTHYROXINE SODIUM 25 MCG PO TABS
25.0000 ug | ORAL_TABLET | Freq: Every day | ORAL | Status: DC
Start: 2014-07-09 — End: 2014-08-15

## 2014-07-09 NOTE — Assessment & Plan Note (Addendum)
Improved, with orthostasis today .  Decreased indeeral to 1/2 dose,  If persistent will stop hctz.   Lab Results  Component Value Date   CREATININE 1.0 07/06/2014   Lab Results  Component Value Date   NA 141 07/06/2014   K 3.9 07/06/2014   CL 101 07/06/2014   CO2 28 07/06/2014

## 2014-07-09 NOTE — Assessment & Plan Note (Signed)
CBC is normal,,  b12 deficiency was managed with monthly b12 injections  Lab Results  Component Value Date   VITAMINB12 181* 08/29/2013   Lab Results  Component Value Date   WBC 6.6 08/29/2013   HGB 12.4 08/29/2013   HCT 37.3 08/29/2013   MCV 85.9 08/29/2013   PLT 308.0 08/29/2013

## 2014-07-09 NOTE — Assessment & Plan Note (Signed)
Resolved with low dose inderal.

## 2014-07-09 NOTE — Assessment & Plan Note (Addendum)
Thyroid function is overactive on current dose.  I have sent a lower dose of levothyroxine to her pharmacy and would like patient to change immediatlely to 25 mcg daily Lab Results  Component Value Date   TSH 0.213* 07/06/2014

## 2014-07-13 DIAGNOSIS — Z23 Encounter for immunization: Secondary | ICD-10-CM | POA: Diagnosis not present

## 2014-07-14 ENCOUNTER — Other Ambulatory Visit: Payer: Self-pay

## 2014-08-14 ENCOUNTER — Other Ambulatory Visit (INDEPENDENT_AMBULATORY_CARE_PROVIDER_SITE_OTHER): Payer: Medicare Other

## 2014-08-14 ENCOUNTER — Telehealth: Payer: Self-pay | Admitting: *Deleted

## 2014-08-14 DIAGNOSIS — E785 Hyperlipidemia, unspecified: Secondary | ICD-10-CM

## 2014-08-14 DIAGNOSIS — E038 Other specified hypothyroidism: Secondary | ICD-10-CM | POA: Diagnosis not present

## 2014-08-14 DIAGNOSIS — E559 Vitamin D deficiency, unspecified: Secondary | ICD-10-CM | POA: Diagnosis not present

## 2014-08-14 DIAGNOSIS — E034 Atrophy of thyroid (acquired): Secondary | ICD-10-CM

## 2014-08-14 DIAGNOSIS — Z79899 Other long term (current) drug therapy: Secondary | ICD-10-CM | POA: Diagnosis not present

## 2014-08-14 LAB — TSH: TSH: 23.17 u[IU]/mL — ABNORMAL HIGH (ref 0.35–4.50)

## 2014-08-14 LAB — COMPREHENSIVE METABOLIC PANEL
ALT: 16 U/L (ref 0–35)
AST: 24 U/L (ref 0–37)
Albumin: 4.3 g/dL (ref 3.5–5.2)
Alkaline Phosphatase: 96 U/L (ref 39–117)
BUN: 13 mg/dL (ref 6–23)
CO2: 27 mEq/L (ref 19–32)
Calcium: 9.7 mg/dL (ref 8.4–10.5)
Chloride: 106 mEq/L (ref 96–112)
Creatinine, Ser: 0.9 mg/dL (ref 0.4–1.2)
GFR: 67.44 mL/min (ref >60.00)
Glucose, Bld: 86 mg/dL (ref 70–99)
Potassium: 5.1 mEq/L (ref 3.5–5.1)
Sodium: 141 mEq/L (ref 135–145)
Total Bilirubin: 0.7 mg/dL (ref 0.2–1.2)
Total Protein: 6.7 g/dL (ref 6.0–8.3)

## 2014-08-14 LAB — LIPID PANEL
Cholesterol: 209 mg/dL — ABNORMAL HIGH (ref 0–200)
HDL: 71.9 mg/dL (ref >39.00)
LDL Cholesterol: 126 mg/dL — ABNORMAL HIGH (ref 0–99)
NonHDL: 137.1
Total CHOL/HDL Ratio: 3
Triglycerides: 55 mg/dL (ref 0.0–149.0)
VLDL: 11 mg/dL (ref 0.0–40.0)

## 2014-08-14 LAB — VITAMIN D 25 HYDROXY (VIT D DEFICIENCY, FRACTURES): VITD: 46.46 ng/mL (ref 30.00–100.00)

## 2014-08-14 NOTE — Telephone Encounter (Signed)
What labs and dx?  

## 2014-08-15 ENCOUNTER — Other Ambulatory Visit: Payer: Self-pay | Admitting: Internal Medicine

## 2014-08-15 ENCOUNTER — Encounter: Payer: Self-pay | Admitting: Internal Medicine

## 2014-08-15 DIAGNOSIS — E034 Atrophy of thyroid (acquired): Secondary | ICD-10-CM

## 2014-08-15 MED ORDER — LEVOTHYROXINE SODIUM 50 MCG PO TABS: 50.0000 ug | ORAL_TABLET | Freq: Every day | ORAL | Status: DC

## 2014-08-16 ENCOUNTER — Encounter: Payer: Self-pay | Admitting: Internal Medicine

## 2014-08-16 ENCOUNTER — Other Ambulatory Visit: Payer: Self-pay | Admitting: Internal Medicine

## 2014-08-16 DIAGNOSIS — E890 Postprocedural hypothyroidism: Secondary | ICD-10-CM

## 2014-08-18 ENCOUNTER — Other Ambulatory Visit: Payer: Self-pay | Admitting: Internal Medicine

## 2014-08-23 ENCOUNTER — Ambulatory Visit (INDEPENDENT_AMBULATORY_CARE_PROVIDER_SITE_OTHER): Payer: Medicare Other | Admitting: Endocrinology

## 2014-08-23 ENCOUNTER — Encounter: Payer: Self-pay | Admitting: Endocrinology

## 2014-08-23 VITALS — BP 106/66 | HR 62 | Resp 14 | Ht 63.0 in | Wt 107.2 lb

## 2014-08-23 DIAGNOSIS — E890 Postprocedural hypothyroidism: Secondary | ICD-10-CM | POA: Diagnosis not present

## 2014-08-23 NOTE — Progress Notes (Signed)
Pre visit review using our clinic review tool, if applicable. No additional management support is needed unless otherwise documented below in the visit note. 

## 2014-08-23 NOTE — Patient Instructions (Signed)
Continue current dose of Synthroid at 50 mcg * 6 tablets weekly.   Recheck levels in 7 weeks at follow up appt.   Continue to follow low gluten diet.

## 2014-08-23 NOTE — Assessment & Plan Note (Signed)
  The patient by history has long standing postablative hypothyroidism, well controlled on Brand Synthroid therapy at low doses till recently.  Most recent labs in October 2015 showed slight decrease in her TSH levels, which have been ranging in the low normal range.  There is corresponding elevation in total T4 levels.  We discussed possibilities included better absorption of the Synthroid medication now that she is on low gluten diet for most of this year, low risk of remission into hyperthyroidism versus pituitary etiology for abnormal labs. Unsure whether IgA gammopathy affects the thyroid binding protein levels.   I have asked her to continue current dose of Synthroid ~43 mcg daily and will recheck levels in 6 weeks, now that most of her symptoms are improving.  If she has evidence of decreasing Synthroid requirements then will investigate other etiologies.  At next visit, will obtain Thyroid profile II, free t3, free T4 levels. Consider celiac screen.     RTC 7 weeks.

## 2014-08-23 NOTE — Progress Notes (Signed)
Reason for visit: Hypothyroidism  HPI  Hailey Hunt is a 68 y.o.-year-old female, referred by her PCP, Deborra Medina, MD,  for evaluation for hypothyroidism, that is recently uncontrolled.  Patient recalls being diagnosed with hypothyroidism in 1984 following radioactive iodine ablation for Graves' hyperthyroidism. Most recently, she was taking Synthroid around 57 mcg daily and TSH levels were ranging between 0.38-0.53. Labs in October 2015 showed a low TSH 0.213 and elevated Total T4, following which her dose was reduced to Synthroid 25 mcg daily. Follow up labs 6 weeks after this dose change, were done 08/14/14 and showed a TSH of 23.17. She was then asked to take Synthroid 50 mcg *6 tabs weekly for TDD~43 mcg, which she has been doing for the past 5 days.    Patient has been taking this medication fasting, with water, separate from breakfast > 30 minutes and from pills ( calcium, iron, PPIs, multivitamins)> 4 hours. Denies missing any doses recently.   She has always been on brand synthroid. Denies any recent use of iodine supplements or herbal medications. Denies any recent illness. Did not take the high dose steroid taper around her October labs.   I reviewed pt's thyroid tests: Lab Results  Component Value Date   TSH 23.17* 08/14/2014   TSH 0.213* 07/06/2014   TSH 0.53 08/29/2013   TSH 0.38 01/20/2012     Review of systems: [ x  ] complains of    [  ] denies [x  ] weight gain- 3-4 lbs since past few months  [  ] constipation  [ x ] fatigue  --> improved after dose change [ x ] dry skin-->improving  [ x ] cold intolerance-->improved [ x ] hair loss [  ] noticing any enlargement in size of thyroid [  ] lumps in neck [  ] dysphagia [  ] change in voice [  ]  SOB with lying down.  Denies increase in frequency of headaches ( has chronic HAs since age 37), changes to peripheral vision, breast discharge Denies recent change in shoe or ring size. Is postmenopausal.  Denies any abnormal  stretch marks or easy bruising. No chronic recurrent use of steroids.   she has family history of  thyroid disorders in: MotherBerenice Primas').  No history of XRT to head or neck. Recd RAI for hyperthyroidism.   I reviewed her chart and she also has a history of IgA monoclonal gammopathy, pernicious anemia. I have reviewed the patient's past medical history, family and social history, surgical history, medications and allergies.  Past Medical History  Diagnosis Date  . Kidney stone   . Hypertension   . Fibromuscular dysplasia of renal artery     managed by Dr. Gregary [facility]  . Neuropathy with IgA monoclonal gammopathy     Dr Mervyn Skeeters  . Dysplasia, artery, fibromuscular, renal   . Urinary incontinence, mixed     resolved with removal of ained sutures in bladder    Past Surgical History  Procedure Laterality Date  . Cataract extraction  Sep 10, 2011, Jan 13,2013  . Renal biopsy    . Vaginal delivery      x2  . Vaginal hysterectomy     Family History  Problem Relation Age of Onset  . Anuerysm Mother   . COPD Mother   . Hypertension Mother   . Cancer Father     lung CA  . Heart disease Brother     viral cardiomyopathy, cocaine use  . Drug abuse Brother   .  Cancer Maternal Aunt     esophageal and live rCA,  hisotory of  ETOH and tobacco  . Anuerysm Maternal Grandfather   . Cancer Paternal Grandmother     brain tumor   History   Social History  . Marital Status: Married    Spouse Name: N/A    Number of Children: 2  . 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 Smoking status: Never Smoker   . Smokeless tobacco: Never Used  . Alcohol Use: No  . Drug Use: No  . Sexual Activity: Not Currently   Other Topics Concern  . Not on file   Social History Narrative   Current Outpatient Prescriptions on File Prior to Visit  Medication Sig Dispense Refill  . adapalene (DIFFERIN) 0.1 % cream Apply topically at  bedtime.    . cyanocobalamin (,VITAMIN B-12,) 1000 MCG/ML injection Inject 1 ml into muscle weekly for 3 weeks, then monthly (Patient taking differently: Inject 1,000 mcg into the muscle every 30 (thirty) days. Inject 1 ml into muscle weekly for 3 weeks, then monthly) 10 mL 1  . levothyroxine (SYNTHROID, LEVOTHROID) 50 MCG tablet Take 1 tablet (50 mcg total) by mouth daily before breakfast. (Patient taking differently: Take 50 mcg by mouth daily before breakfast. 6 days a week) 901 tablet 1  . propranolol (INDERAL) 10 MG tablet Take 0.5 tablets (5 mg total) by mouth 2 (two) times daily. 60 tablet 5  . Syringe/Needle, Disp, (SYRINGE 3CC/27GX1-1/4") 27G X 1-1/4" 3 ML MISC For B12 injections 100 each 0  . traMADol (ULTRAM) 50 MG tablet Take 1 tablet (50 mg total) by mouth every 8 (eight) hours as needed. 30 tablet 0   No current facility-administered medications on file prior to visit.   Allergies  Allergen Reactions  . Voltaren [Diclofenac] Hypertension  . Adhesive [Tape]   . Demerol     HYPOTENSION  . Macrodantin   . Codeine Nausea And Vomiting and Rash  . Penicillins Rash  . Sulfa Antibiotics Rash    Fever        ROS: Review of Systems: [x]  complains of  [  ] denies General:   [ x ] Recent weight change [ x ] Fatigue  [  ] Loss of appetite Eyes: [  ]  Vision Difficulty [  ]  Eye pain ENT: [  ]  Hearing difficulty [  ]  Difficulty Swallowing CVS: [  ] Chest pain [ x ]  Palpitations/Irregular Heart beat [ x ]  Shortness of breath lying flat [  ] Swelling of legs Resp: [ x ] Frequent Cough [ x ] Shortness of Breath  [  ]  Wheezing GI: [  ] Heartburn  [  ] Nausea or Vomiting  [  ] Diarrhea [  ] Constipation  [  ] Abdominal Pain GU: [  ]  Polyuria  [ x ]  nocturia Bones/joints:  [  ]  Muscle aches  [  ] Joint Pain  [  ] Bone pain Skin/Hair/Nails: [  ]  Rash  [  ] New stretch marks [ x ]  Itching [ x ] Hair loss [  ]  Excessive hair growth Reproduction: [x  ] Low sexual desire , [  ]   Women: Menstrual cycle problems [  ]  Women: Breast Discharge [  ] Men: Difficulty with erections [  ]  Men: Enlarged Breasts  CNS: [ x ] Frequent Headaches [  ] Blurry vision [  ] Tremors [  ] Seizures [  ] Loss of consciousness [  ] Localized weakness Endocrine: [  ]  Excess thirst [  ]  Feeling excessively hot [ x ]  Feeling excessively cold Heme: [ x ]  Easy bruising [  ]  Enlarged glands or lumps in neck Allergy: [  ]  Food allergies [  ] Environmental allergies  PE: BP 106/66 mmHg  Pulse 62  Resp 14  Ht 5\' 3"  (1.6 m)  Wt 107 lb 4 oz (48.648 kg)  BMI 19.00 kg/m2  SpO2 98% Wt Readings from Last 3 Encounters:  08/23/14 107 lb 4 oz (48.648 kg)  07/06/14 104 lb 8 oz (47.401 kg)  08/29/13 113 lb 8 oz (51.483 kg)    HEENT: [place]/AT, EOMI, no icterus, no proptosis, no chemosis, no mild lid lag, no retraction, eyes close completely, gross normal VF testing on confrontation Neck: thyroid gland - smooth, non-tender, no erythema, no tracheal deviation; negative Pemberton's sign; no lymphadenopathy; no bruits Lungs: good air entry, clear bilaterally Heart: S1&S2 normal, regular rate & rhythm; systolic murmurs+, rubs or gallops Abd: soft, NT, ND, no HSM, +BS Ext:min tremor in hands bilaterally, no edema, 2+ DP/PT pulses, good muscle mass Neuro: normal gait, 2+ reflexes bilaterally, normal 5/5 strength, no proximal myopathy  Derm: no pretibial myxoedema/skin dryness   ASSESSMENT: 1. Hypothyroidism, uncontrolled    Problem List Items Addressed This Visit      Endocrine   Hypothyroidism following radioiodine therapy - Primary     The patient by history has long standing postablative hypothyroidism, well controlled on Brand Synthroid therapy at low doses till recently.  Most recent labs in October 2015 showed slight decrease in her TSH levels, which have been ranging in the low normal range.  There is corresponding elevation in total T4 levels.  We discussed possibilities included better  absorption of the Synthroid medication now that she is on low gluten diet for most of this year, low risk of remission into hyperthyroidism versus pituitary etiology for abnormal labs. Unsure whether IgA gammopathy affects the thyroid binding protein levels.   I have asked her to continue current dose of Synthroid ~43 mcg daily and will recheck levels in 6 weeks, now that most of her symptoms are improving.  If she has evidence of decreasing Synthroid requirements then will investigate other etiologies.  At next visit, will obtain Thyroid profile II, free t3, free T4 levels. Consider celiac screen.     RTC 7 weeks.        -RTC 7 weeks 45 minutes spent with the patient, >50% time spent of counseling of topics mentioned above- thyroid lab abnormalities, subclinical autoimmune processes like gluten sensitivity.  Jigar Zielke [facility]  08/23/2014   3:01 PM

## 2014-08-29 ENCOUNTER — Telehealth: Payer: Self-pay | Admitting: Internal Medicine

## 2014-08-29 NOTE — Telephone Encounter (Signed)
Looks like she just had labs done 08/14/14 by Dr. Howell Rucks, need anything additional?

## 2014-08-29 NOTE — Telephone Encounter (Signed)
Patient would like to know if the doctor would like for her to have labs before her 10/18/2014 appointment.

## 2014-08-29 NOTE — Telephone Encounter (Signed)
Ne needed before her appt with me.

## 2014-08-30 NOTE — Telephone Encounter (Signed)
Do you want her labs drawn prior to her appt?

## 2014-08-30 NOTE — Telephone Encounter (Signed)
Sent mychart message

## 2014-08-30 NOTE — Telephone Encounter (Signed)
No- I will get the thyroid labs when I see her in clinic. Thanks.

## 2014-09-06 ENCOUNTER — Encounter: Payer: Medicare Other | Admitting: Internal Medicine

## 2014-09-07 ENCOUNTER — Other Ambulatory Visit: Payer: Self-pay | Admitting: Internal Medicine

## 2014-09-07 NOTE — Telephone Encounter (Signed)
Ok to refill,  Refill sent  

## 2014-09-07 NOTE — Telephone Encounter (Signed)
Last OV 12.1.14, last B12 lab draw 12.1.14. Please advise refill

## 2014-09-27 DIAGNOSIS — Z01419 Encounter for gynecological examination (general) (routine) without abnormal findings: Secondary | ICD-10-CM | POA: Diagnosis not present

## 2014-09-27 DIAGNOSIS — Z681 Body mass index (BMI) 19 or less, adult: Secondary | ICD-10-CM | POA: Diagnosis not present

## 2014-09-27 DIAGNOSIS — N390 Urinary tract infection, site not specified: Secondary | ICD-10-CM | POA: Diagnosis not present

## 2014-09-27 DIAGNOSIS — Z0000 Encounter for general adult medical examination without abnormal findings: Secondary | ICD-10-CM | POA: Diagnosis not present

## 2014-09-27 DIAGNOSIS — R319 Hematuria, unspecified: Secondary | ICD-10-CM | POA: Diagnosis not present

## 2014-09-28 ENCOUNTER — Encounter: Payer: Self-pay | Admitting: *Deleted

## 2014-10-09 ENCOUNTER — Other Ambulatory Visit: Payer: Self-pay | Admitting: Internal Medicine

## 2014-10-18 ENCOUNTER — Encounter: Payer: Self-pay | Admitting: Endocrinology

## 2014-10-18 ENCOUNTER — Ambulatory Visit (INDEPENDENT_AMBULATORY_CARE_PROVIDER_SITE_OTHER): Payer: Medicare Other | Admitting: Endocrinology

## 2014-10-18 VITALS — BP 110/62 | HR 55 | Resp 14 | Ht 63.0 in | Wt 109.0 lb

## 2014-10-18 DIAGNOSIS — E890 Postprocedural hypothyroidism: Secondary | ICD-10-CM

## 2014-10-18 LAB — T4, FREE: Free T4: 0.77 ng/dL (ref 0.60–1.60)

## 2014-10-18 LAB — T3, FREE: T3, Free: 2.5 pg/mL (ref 2.3–4.2)

## 2014-10-18 NOTE — Assessment & Plan Note (Signed)
  The patient by history has long standing postablative hypothyroidism, well controlled on Brand Synthroid therapy at low doses till recently.  Last dose adjustment in Synthroid done about 8 weeks ago.   We discussed possibilities included better absorption of the Synthroid medication now that she is on low gluten diet for most of this year, low risk of remission into hyperthyroidism versus pituitary etiology for abnormal labs at prior visits.   I have asked her to continue current dose of Synthroid ~43 mcg daily and will recheck levels today.  If she has evidence of decreasing Synthroid requirements then will investigate other etiologies.      RTC 4 months

## 2014-10-18 NOTE — Progress Notes (Signed)
Reason for visit: Hypothyroidism  HPI  Hailey Hunt is a 69 y.o.-year-old female, for follow up for hypothyroidism, that is recently uncontrolled.  Patient recalls being diagnosed with hypothyroidism in 1984 following radioactive iodine ablation for Graves' hyperthyroidism. Most recently, her doses of Synthroid have been changing frequently due to abnormal thyroid labs.  Since past 8 weeks, she has been taking Synthroid ~43 mcg daily ( 50 mcg *6 tablets weekly)   Patient has been taking this medication fasting, with water, separate from breakfast > 30 minutes and from pills ( calcium, iron, PPIs, multivitamins)> 4 hours. Denies missing any doses recently.   She has always been on brand synthroid. Denies any recent use of iodine supplements or herbal medications. Denies any recent illness. Did not take the high dose steroid taper around her October labs.   I reviewed pt's thyroid tests: Lab Results  Component Value Date   TSH 23.17* 08/14/2014   TSH 0.213* 07/06/2014   TSH 0.53 08/29/2013   TSH 0.38 01/20/2012     Review of systems: [ x  ] complains of    [  ] denies [x  ] weight gain- 2 lbs since past few months  [  ] constipation  [ x ] fatigue  -->stable, unclear whether it is due to shorter days and recent holidays [  ] dry skin  [ x ] temp intolerance-->improved but persistent [ x ] hair loss- no changes [  ] noticing any enlargement in size of thyroid [  ] lumps in neck [  ] dysphagia [  ] change in voice [  ]  SOB with lying down. Has tremors at baseline and occasional palpitations  Denies increase in frequency of headaches ( has chronic HAs since age 89), changes to peripheral vision, breast discharge Denies recent change in shoe or ring size. Is postmenopausal.  Denies any abnormal stretch marks or easy bruising. No chronic recurrent use of steroids.   she has family history of  thyroid disorders in: MotherBerenice Primas').  No history of XRT to head or neck. Recd RAI for  hyperthyroidism.   I reviewed her chart and she also has a history of IgA monoclonal gammopathy, pernicious anemia. I have reviewed the patient's past medical history,  medications and allergies.    Current Outpatient Prescriptions on File Prior to Visit  Medication Sig Dispense Refill  . adapalene (DIFFERIN) 0.1 % cream Apply topically at bedtime.    . cholecalciferol (VITAMIN D) 1000 UNITS tablet Take 1,000 Units by mouth daily.    . cyanocobalamin (,VITAMIN B-12,) 1000 MCG/ML injection Inject 1 mL (1,000 mcg total) into the muscle every 30 (thirty) days. 10 mL 1  . levothyroxine (SYNTHROID, LEVOTHROID) 50 MCG tablet Take 1 tablet (50 mcg total) by mouth daily before breakfast. (Patient taking differently: Take 50 mcg by mouth daily before breakfast. 6 days a week) 901 tablet 1  . Probiotic Product (TRUBIOTICS PO) Take 1 capsule by mouth daily.    . propranolol (INDERAL) 10 MG tablet Take 0.5 tablets (5 mg total) by mouth 2 (two) times daily. 30 tablet 5  . Syringe/Needle, Disp, (SYRINGE 3CC/27GX1-1/4") 27G X 1-1/4" 3 ML MISC For B12 injections 100 each 0  . traMADol (ULTRAM) 50 MG tablet Take 1 tablet (50 mg total) by mouth every 8 (eight) hours as needed. 30 tablet 0   No current facility-administered medications on file prior to visit.   Allergies  Allergen Reactions  . Voltaren [Diclofenac] Hypertension  . Adhesive [  Tape]   . Demerol     HYPOTENSION  . Macrodantin   . Nsaids Hypertension  . Codeine Nausea And Vomiting and Rash  . Nickel Rash  . Penicillins Rash  . Sulfa Antibiotics Rash    Fever    PE: BP 110/62 mmHg  Pulse 55  Resp 14  Ht 5\' 3"  (1.6 m)  Wt 109 lb (49.442 kg)  BMI 19.31 kg/m2  SpO2 99% Wt Readings from Last 3 Encounters:  10/18/14 109 lb (49.442 kg)  08/23/14 107 lb 4 oz (48.648 kg)  07/06/14 104 lb 8 oz (47.401 kg)    HEENT: [place]/AT, EOMI, no icterus, no proptosis, no chemosis, no mild lid lag, no retraction, eyes close completely, gross normal VF  testing on confrontation ( prior visit) Neck: thyroid gland - smooth, non-tender, no erythema, no tracheal deviation; negative Pemberton's sign; no lymphadenopathy; no bruits Lungs: good air entry, clear bilaterally Heart: S1&S2 normal, regular rate & rhythm; systolic murmurs+, rubs or gallops Ext:min tremor in hands bilaterally, no edema, 2+ DP/PT pulses, good muscle mass Neuro: normal gait, 2+ reflexes bilaterally, normal 5/5 strength, no proximal myopathy  Derm: no pretibial myxoedema/skin dryness   ASSESSMENT: 1. Hypothyroidism, uncontrolled    Problem List Items Addressed This Visit      Endocrine   Hypothyroidism following radioiodine therapy - Primary     The patient by history has long standing postablative hypothyroidism, well controlled on Brand Synthroid therapy at low doses till recently.  Last dose adjustment in Synthroid done about 8 weeks ago.   We discussed possibilities included better absorption of the Synthroid medication now that she is on low gluten diet for most of this year, low risk of remission into hyperthyroidism versus pituitary etiology for abnormal labs at prior visits.   I have asked her to continue current dose of Synthroid ~43 mcg daily and will recheck levels today.  If she has evidence of decreasing Synthroid requirements then will investigate other etiologies.      RTC 4 months        Relevant Orders   Thyroid Profile II   T4, free   T3, free      -RTC 4 months  Hailey Hunt [facility]  10/18/2014   9:14 AM

## 2014-10-18 NOTE — Patient Instructions (Signed)
Continue current Synthroid.  Check levels today.  Adjust dose based on these labs.  Please come back for a follow-up appointment in 4 months

## 2014-10-19 ENCOUNTER — Other Ambulatory Visit: Payer: Self-pay | Admitting: Endocrinology

## 2014-10-19 DIAGNOSIS — E890 Postprocedural hypothyroidism: Secondary | ICD-10-CM

## 2014-10-19 LAB — THYROID PROFILE II
Free Thyroxine Index: 2.3 (ref 1.2–4.9)
T3 Uptake Ratio: 26 % (ref 24–39)
T3, Total: 84 ng/dL (ref 71–180)
T4, Total: 8.8 ug/dL (ref 4.5–12.0)
TSH: 7.66 u[IU]/mL — ABNORMAL HIGH (ref 0.450–4.500)

## 2014-10-19 MED ORDER — LEVOTHYROXINE SODIUM 50 MCG PO TABS: 50.0000 ug | ORAL_TABLET | Freq: Every day | ORAL | Status: DC

## 2014-10-23 ENCOUNTER — Telehealth: Payer: Self-pay | Admitting: *Deleted

## 2014-10-23 NOTE — Telephone Encounter (Signed)
Fax from pharmacy, needing PA for Synthroid. Started online, pending response.

## 2014-10-24 NOTE — Telephone Encounter (Signed)
Received a call from [employer] company stating that PA has been approved for 1 yr. Notified pharmacy.

## 2014-10-30 DIAGNOSIS — N182 Chronic kidney disease, stage 2 (mild): Secondary | ICD-10-CM | POA: Diagnosis not present

## 2014-10-30 DIAGNOSIS — I10 Essential (primary) hypertension: Secondary | ICD-10-CM | POA: Diagnosis not present

## 2014-11-02 DIAGNOSIS — I10 Essential (primary) hypertension: Secondary | ICD-10-CM | POA: Diagnosis not present

## 2014-11-17 ENCOUNTER — Telehealth: Payer: Self-pay | Admitting: *Deleted

## 2014-11-17 NOTE — Telephone Encounter (Signed)
Patients amlodipine was stopped by Dr. Lucky Cowboy a while back and the HCTZ was stopped by MD at last OV, Patient would lie to know because of the FMD and the fact she was advised not to let her diastolic go over [phone] -[phone] which medication should she restart. Patient stated you can reach her by MyChart if after hours. Wanted MD opinion.

## 2014-11-17 NOTE — Telephone Encounter (Signed)
Is she referring to the propranolol  Or to  the hctz?  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

## 2014-11-17 NOTE — Telephone Encounter (Signed)
Pt called states her Diastolic BP has been running 130-140 over the last 2 weeks.  Pt is requesting whether she is to resume taking her BP medication.

## 2014-11-20 NOTE — Telephone Encounter (Signed)
She can resume the amlodipine.  My mychart message that i sent on Feb 20th did not go through.

## 2014-11-21 NOTE — Telephone Encounter (Signed)
Patient received my chart and has resumed amlodipine as directed.

## 2014-12-13 ENCOUNTER — Ambulatory Visit (INDEPENDENT_AMBULATORY_CARE_PROVIDER_SITE_OTHER): Payer: Medicare Other | Admitting: Internal Medicine

## 2014-12-13 ENCOUNTER — Encounter: Payer: Self-pay | Admitting: Nurse Practitioner

## 2014-12-13 VITALS — BP 118/78 | HR 70 | Temp 98.4°F | Resp 14 | Ht 63.0 in | Wt 108.1 lb

## 2014-12-13 DIAGNOSIS — R5081 Fever presenting with conditions classified elsewhere: Secondary | ICD-10-CM | POA: Diagnosis not present

## 2014-12-13 DIAGNOSIS — J111 Influenza due to unidentified influenza virus with other respiratory manifestations: Secondary | ICD-10-CM | POA: Insufficient documentation

## 2014-12-13 DIAGNOSIS — J1189 Influenza due to unidentified influenza virus with other manifestations: Secondary | ICD-10-CM | POA: Diagnosis not present

## 2014-12-13 LAB — POCT RAPID STREP A (OFFICE): Rapid Strep A Screen: POSITIVE — AB

## 2014-12-13 MED ORDER — LEVOFLOXACIN 500 MG PO TABS: 500.0000 mg | ORAL_TABLET | Freq: Every day | ORAL | Status: DC

## 2014-12-13 NOTE — Patient Instructions (Addendum)
Yo have the flu , complicated by sinusitis  I am treating you with levaquin for the infection   Try the Tesslon perles and amaretto for the cough   prednisone taper for the inflammation  Continue your probiotic

## 2014-12-13 NOTE — Progress Notes (Deleted)
   Subjective:    Patient ID: Hailey Hunt, female    DOB: November 25, 1945, 69 y.o.   MRN: [id number]  HPI  Mucous from nose- green and bloody   Review of Systems     Objective:   Physical Exam   BP 118/78 mmHg  Pulse 70  Temp(Src) 98.4 F (36.9 C) (Oral)  Resp 14  Ht 5\' 3"  (1.6 m)  Wt 108 lb 1.9 oz (49.043 kg)  BMI 19.16 kg/m2  SpO2 98%      Assessment & Plan:

## 2014-12-13 NOTE — Progress Notes (Signed)
Patient ID: Hailey Hunt, female   DOB: May 25, 1946, 69 y.o.   MRN: [id number]  Patient Active Problem List   Diagnosis Date Noted  . Influenza with respiratory manifestations 12/13/2014  . History of pneumonia 08/29/2013  . Family history of tobacco abuse 08/29/2013  . Exertional dyspnea 08/29/2013  . History of nephrolithiasis 01/10/2013  . Allergic rhinitis 01/10/2013  . Edema 01/10/2013  . Urinary incontinence, mixed   . Hypothyroidism following radioiodine therapy 07/14/2012  . Nocturnal headaches 07/14/2012  . 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 Vitamin D deficiency 01/21/2012  . Neuropathy with IgA monoclonal gammopathy   . Pernicious anemia 01/14/2012  . History of transfusion of packed red blood cells 01/14/2012  . Hypertension 01/14/2012  . Fibromuscular dysplasia of renal artery     Subjective:  CC:   Chief Complaint  Patient presents with  . Cough    Non productive cough with fever at home x6 days. Nasal mucus with blood.      HPI:   Hailey Hunt is a 69 y.o. female who presents for  Fever and cough x 6 days, with blood streaked nasal discharge. Body aches, started with sore throat headache.   Past Medical History  Diagnosis Date  . Kidney stone   . Hypertension   . Fibromuscular dysplasia of renal artery     managed by Dr. Gregary [facility]s [facility]  . Neuropathy with IgA monoclonal gammopathy     Dr Mervyn Skeeters  . Dysplasia, artery, fibromuscular, renal   . Urinary incontinence, mixed     resolved with removal of ained sutures in bladder     Past Surgical History  Procedure Laterality Date  . Cataract extraction  Sep 10, 2011, Jan 13,2013  . Renal biopsy    . Vaginal delivery      x2  . Vaginal hysterectomy         The following portions of the patient's history were reviewed and updated as appropriate: Allergies, current medications, and problem list.    Review of Systems:   Patient denies headache, fevers, malaise, unintentional weight loss,  skin rash, eye pain, sinus congestion and sinus pain, sore throat, dysphagia,  hemoptysis , cough, dyspnea, wheezing, chest pain, palpitations, orthopnea, edema, abdominal pain, nausea, melena, diarrhea, constipation, flank pain, dysuria, hematuria, urinary  Frequency, nocturia, numbness, tingling, seizures,  Focal weakness, Loss of consciousness,  Tremor, insomnia, depression, anxiety, and suicidal ideation.     History   Social History  . Marital Status: Married    Spouse Name: N/A  . Number of Children: 2  . 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 Smoking status: Never Smoker   . Smokeless tobacco: Never Used  . Alcohol Use: No  . Drug Use: No  . Sexual Activity: Not Currently   Other Topics Concern  . Not on file   Social History Narrative    Objective:  Filed Vitals:   12/13/14 1626  BP: 118/78  Pulse: 70  Temp: 98.4 F (36.9 C)  Resp: 14     General appearance: alert, cooperative and appears stated age Ears: normal TM's and external ear canals both ears Face: bilateral maxillary tenderness. Throat: lips, mucosa, and tongue normal; teeth and gums normal. Some tonsillar erythema.  Neck: cervical lymphadenopathy, no carotid bruit, supple, symmetrical, trachea midline and thyroid not enlarged, symmetric Back: symmetric, no curvature. ROM normal. No CVA tenderness. Lungs: clear to auscultation bilaterally Heart:  regular rate and rhythm, S1, S2 normal, no murmur, click, rub or gallop Abdomen: soft, non-tender; bowel sounds normal; no masses,  no organomegaly Pulses: 2+ and symmetric Skin: Skin color, texture, turgor normal. No rashes or lesions Lymph nodes: Cervical, supraclavicular, and axillary nodes normal.  Assessment and Plan:  Influenza with respiratory manifestations Symptoms present 6 days.complicated now by sinusitis.  She is allergic to hydrocodone and pcn;   Will use levaquin, tessalon  and prednisone     Updated Medication List Outpatient Encounter Prescriptions as of 12/13/2014  Medication Sig  . adapalene (DIFFERIN) 0.1 % cream Apply topically at bedtime.  . cholecalciferol (VITAMIN D) 1000 UNITS tablet Take 1,000 Units by mouth daily.  . cyanocobalamin (,VITAMIN B-12,) 1000 MCG/ML injection Inject 1 mL (1,000 mcg total) into the muscle every 30 (thirty) days.  Marland Kitchen levothyroxine (SYNTHROID, LEVOTHROID) 50 MCG tablet Take 1 tablet (50 mcg total) by mouth daily before breakfast.  . Probiotic Product (TRUBIOTICS PO) Take 1 capsule by mouth daily.  . propranolol (INDERAL) 10 MG tablet Take 0.5 tablets (5 mg total) by mouth 2 (two) times daily.  . Syringe/Needle, Disp, (SYRINGE 3CC/27GX1-1/4") 27G X 1-1/4" 3 ML MISC For B12 injections  . traMADol (ULTRAM) 50 MG tablet Take 1 tablet (50 mg total) by mouth every 8 (eight) hours as needed.  Marland Kitchen levofloxacin (LEVAQUIN) 500 MG tablet Take 1 tablet (500 mg total) by mouth daily.     Orders Placed This Encounter  Procedures  . POCT rapid strep A    No Follow-up on file.

## 2014-12-13 NOTE — Progress Notes (Signed)
Pre visit review using our clinic review tool, if applicable. No additional management support is needed unless otherwise documented below in the visit note. 

## 2014-12-14 ENCOUNTER — Telehealth: Payer: Self-pay

## 2014-12-14 ENCOUNTER — Other Ambulatory Visit: Payer: Medicare Other

## 2014-12-14 NOTE — Telephone Encounter (Signed)
I think the steroid course is for 7 days. If so, please ask patient to return in 2 weeks for labs. If it is longer, then okay to do labs in 3 weeks. thanks

## 2014-12-14 NOTE — Telephone Encounter (Signed)
Left patient detailed message concerning appointment for lab.

## 2014-12-14 NOTE — Telephone Encounter (Signed)
The patient called and is hoping to discuss her lab work situation with Juliann Pulse.  She stated it was too complicated to go through triage, and Juliann Pulse would understand the best.   Callback - [phone]

## 2014-12-14 NOTE — Telephone Encounter (Signed)
Patient was to come for labs for Dr. Howell Rucks to check thyroid after starting 50 Mcg of levothyroxine, patient is currently taking prednisone for infection after the Flu, patient is concerned that the prednisone will throw off labs and cancelled. Patient would like to know how long should she wait after the course of prednisone to have labs?

## 2014-12-16 ENCOUNTER — Encounter: Payer: Self-pay | Admitting: Internal Medicine

## 2014-12-16 NOTE — Assessment & Plan Note (Signed)
Symptoms present 6 days.complicated now by sinusitis.  She is allergic to hydrocodone and pcn;   Will use levaquin, tessalon and prednisone

## 2015-01-03 ENCOUNTER — Other Ambulatory Visit (INDEPENDENT_AMBULATORY_CARE_PROVIDER_SITE_OTHER): Payer: Medicare Other

## 2015-01-03 DIAGNOSIS — E890 Postprocedural hypothyroidism: Secondary | ICD-10-CM

## 2015-01-03 LAB — TSH: TSH: 1.62 u[IU]/mL (ref 0.35–4.50)

## 2015-01-03 LAB — T4, FREE: Free T4: 0.96 ng/dL (ref 0.60–1.60)

## 2015-02-20 ENCOUNTER — Encounter: Payer: Self-pay | Admitting: Endocrinology

## 2015-02-20 ENCOUNTER — Ambulatory Visit (INDEPENDENT_AMBULATORY_CARE_PROVIDER_SITE_OTHER): Payer: Medicare Other | Admitting: Endocrinology

## 2015-02-20 VITALS — BP 112/64 | HR 55 | Resp 14 | Ht 63.0 in | Wt 110.2 lb

## 2015-02-20 DIAGNOSIS — E890 Postprocedural hypothyroidism: Secondary | ICD-10-CM | POA: Diagnosis not present

## 2015-02-20 NOTE — Patient Instructions (Signed)
Continue current Synthroid. Follow up with Dr Derrel Nip in the next few months

## 2015-02-20 NOTE — Assessment & Plan Note (Signed)
  The patient by history has long standing postablative hypothyroidism, well controlled on Brand Synthroid therapy at low doses till recently.  Last dose adjustment in Synthroid done about Jan 2016. Follow up levels April were in normal range.  Continue current dose and recheck levels at next follow up visit with Dr Derrel Nip this early Fall.

## 2015-02-20 NOTE — Progress Notes (Signed)
Reason for visit: Hypothyroidism  HPI  Hailey Hunt is a 69 y.o.-year-old female, for follow up for hypothyroidism, that is recently uncontrolled. Last visit 4 months ago.  Patient recalls being diagnosed with hypothyroidism in 1984 following radioactive iodine ablation for Graves' hyperthyroidism. Most recently, her doses of Synthroid have been changing frequently due to abnormal thyroid labs.  Since Jan 2016, she has been taking Synthroid 50 mcg daily. Thyroid labs after this dose change were in normal range in April 2016.   Patient has been taking this medication fasting, with water, separate from breakfast > 30 minutes and from pills ( calcium, iron, PPIs, multivitamins)> 4 hours. Denies missing any doses recently.   She has always been on brand synthroid. Denies any recent use of iodine supplements or herbal medications. Denies any recent illness.    I reviewed pt's thyroid tests: Lab Results  Component Value Date   TSH 1.62 01/03/2015   TSH 7.660* 10/18/2014   TSH 23.17* 08/14/2014   TSH 0.213* 07/06/2014   TSH 0.53 08/29/2013   TSH 0.38 01/20/2012   FREET4 0.96 01/03/2015   FREET4 0.77 10/18/2014   T3FREE 2.5 10/18/2014     Review of systems: [ x  ] complains of    [  ] denies [x  ] weight gain- 2 lbs since past few months  [  ] constipation  [  ] fatigue  [  ] dry skin  [ x ] temp intolerance-->improved but persistent [ x ] hair loss- no changes [  ] noticing any enlargement in size of thyroid [  ] lumps in neck [  ] dysphagia [  ] change in voice [  ]  SOB with lying down. Has tremors at baseline and occasional palpitations. Needs FMD surgery.  Denies persistent headaches ( has chronic HAs since age 57), changes to peripheral vision, breast discharge   she has family history of  thyroid disorders in: MotherBerenice Primas').  No history of XRT to head or neck. Recd RAI for hyperthyroidism.   I reviewed her chart and she also has a history of IgA monoclonal gammopathy,  pernicious anemia. I have reviewed the patient's past medical history,  medications and allergies.    Current Outpatient Prescriptions on File Prior to Visit  Medication Sig Dispense Refill  . adapalene (DIFFERIN) 0.1 % cream Apply topically at bedtime.    . cholecalciferol (VITAMIN D) 1000 UNITS tablet Take 1,000 Units by mouth daily.    . cyanocobalamin (,VITAMIN B-12,) 1000 MCG/ML injection Inject 1 mL (1,000 mcg total) into the muscle every 30 (thirty) days. 10 mL 1  . levothyroxine (SYNTHROID, LEVOTHROID) 50 MCG tablet Take 1 tablet (50 mcg total) by mouth daily before breakfast. 90 tablet 1  . Probiotic Product (TRUBIOTICS PO) Take 1 capsule by mouth daily.    . propranolol (INDERAL) 10 MG tablet Take 0.5 tablets (5 mg total) by mouth 2 (two) times daily. 30 tablet 5  . Syringe/Needle, Disp, (SYRINGE 3CC/27GX1-1/4") 27G X 1-1/4" 3 ML MISC For B12 injections 100 each 0  . traMADol (ULTRAM) 50 MG tablet Take 1 tablet (50 mg total) by mouth every 8 (eight) hours as needed. 30 tablet 0   No current facility-administered medications on file prior to visit.   Allergies  Allergen Reactions  . Voltaren [Diclofenac] Hypertension  . Adhesive [Tape]   . Demerol     HYPOTENSION  . Macrodantin   . Nsaids Hypertension  . Codeine Nausea And Vomiting and Rash  .  Nickel Rash  . Penicillins Rash  . Sulfa Antibiotics Rash    Fever    PE: BP 112/64 mmHg  Pulse 55  Resp 14  Ht 5\' 3"  (1.6 m)  Wt 110 lb 4 oz (50.009 kg)  BMI 19.53 kg/m2  SpO2 98% Wt Readings from Last 3 Encounters:  02/20/15 110 lb 4 oz (50.009 kg)  12/13/14 108 lb 1.9 oz (49.043 kg)  10/18/14 109 lb (49.442 kg)    HEENT: [place]/AT, EOMI, no icterus, no proptosis, no chemosis, no mild lid lag, no retraction, eyes close completely,  Neck: thyroid gland - smooth, non-tender, no erythema, no tracheal deviation; negative Pemberton's sign; no lymphadenopathy; no bruits Lungs: good air entry, clear bilaterally Heart: S1&S2  normal, regular rate & rhythm; systolic murmurs+, rubs or gallops Ext:min tremor in hands bilaterally, no edema, 2+ DP/PT pulses, good muscle mass Neuro: normal gait, 2+ reflexes bilaterally, normal 5/5 strength, no proximal myopathy  Derm: no pretibial myxoedema/skin dryness   ASSESSMENT: 1. Hypothyroidism, uncontrolled    Problem List Items Addressed This Visit      Endocrine   Hypothyroidism following radioiodine therapy - Primary     The patient by history has long standing postablative hypothyroidism, well controlled on Brand Synthroid therapy at low doses till recently.  Last dose adjustment in Synthroid done about Jan 2016. Follow up levels April were in normal range.  Continue current dose and recheck levels at next follow up visit with Dr Derrel Nip this early Fall.                  -RTC as needed. Can follow up with Dr Derrel Nip for hypothyroidism  [facility]  02/20/2015   8:34 AM

## 2015-02-20 NOTE — Progress Notes (Signed)
Pre visit review using our clinic review tool, if applicable. No additional management support is needed unless otherwise documented below in the visit note. 

## 2015-03-06 DIAGNOSIS — Z1231 Encounter for screening mammogram for malignant neoplasm of breast: Secondary | ICD-10-CM | POA: Diagnosis not present

## 2015-03-06 LAB — HM MAMMOGRAPHY: HM Mammogram: NEGATIVE

## 2015-03-07 ENCOUNTER — Encounter: Payer: Self-pay | Admitting: *Deleted

## 2015-03-19 ENCOUNTER — Other Ambulatory Visit: Payer: Self-pay | Admitting: Internal Medicine

## 2015-03-20 ENCOUNTER — Encounter: Payer: Self-pay | Admitting: Internal Medicine

## 2015-03-28 ENCOUNTER — Other Ambulatory Visit: Payer: Medicare Other

## 2015-03-28 ENCOUNTER — Telehealth: Payer: Self-pay | Admitting: *Deleted

## 2015-03-28 DIAGNOSIS — E876 Hypokalemia: Secondary | ICD-10-CM

## 2015-03-28 DIAGNOSIS — E034 Atrophy of thyroid (acquired): Secondary | ICD-10-CM | POA: Diagnosis not present

## 2015-03-28 DIAGNOSIS — E038 Other specified hypothyroidism: Secondary | ICD-10-CM | POA: Diagnosis not present

## 2015-03-28 NOTE — Telephone Encounter (Signed)
Pt says a BMET is suppose to be done? And only a tsh and t4 was ordered?

## 2015-03-29 LAB — T4 AND TSH
T4, Total: 11.3 ug/dL (ref 4.5–12.0)
TSH: 0.708 u[IU]/mL (ref 0.450–4.500)

## 2015-04-06 LAB — BASIC METABOLIC PANEL
BUN/Creatinine Ratio: 17 (ref 11–26)
BUN: 17 mg/dL (ref 8–27)
CO2: 25 mmol/L (ref 18–29)
Calcium: 10 mg/dL (ref 8.7–10.3)
Chloride: 100 mmol/L (ref 97–108)
Creatinine, Ser: 1 mg/dL (ref 0.57–1.00)
GFR calc Af Amer: 68 mL/min/{1.73_m2} (ref >59)
GFR calc non Af Amer: 59 mL/min/{1.73_m2} — ABNORMAL LOW (ref >59)
Glucose: 89 mg/dL (ref 65–99)
Potassium: 4.4 mmol/L (ref 3.5–5.2)
Sodium: 146 mmol/L — ABNORMAL HIGH (ref 134–144)

## 2015-04-06 LAB — SPECIMEN STATUS REPORT

## 2015-04-07 ENCOUNTER — Encounter: Payer: Self-pay | Admitting: Internal Medicine

## 2015-05-30 ENCOUNTER — Other Ambulatory Visit: Payer: Self-pay | Admitting: Internal Medicine

## 2015-06-27 ENCOUNTER — Telehealth: Payer: Self-pay | Admitting: *Deleted

## 2015-06-27 NOTE — Telephone Encounter (Signed)
Yes thanks 

## 2015-06-27 NOTE — Telephone Encounter (Signed)
Received a fax from [facility] from Darcus Austin, MD a order for a bmp dx code: fibromuscular dysplasia... Ok to put in?

## 2015-07-02 ENCOUNTER — Other Ambulatory Visit: Payer: Medicare Other

## 2015-07-05 DIAGNOSIS — Z23 Encounter for immunization: Secondary | ICD-10-CM | POA: Diagnosis not present

## 2015-07-10 DIAGNOSIS — I773 Arterial fibromuscular dysplasia: Secondary | ICD-10-CM | POA: Diagnosis not present

## 2015-07-10 DIAGNOSIS — I10 Essential (primary) hypertension: Secondary | ICD-10-CM | POA: Diagnosis not present

## 2015-07-10 DIAGNOSIS — I159 Secondary hypertension, unspecified: Secondary | ICD-10-CM | POA: Diagnosis not present

## 2015-07-11 DIAGNOSIS — R002 Palpitations: Secondary | ICD-10-CM | POA: Diagnosis not present

## 2015-07-11 DIAGNOSIS — I773 Arterial fibromuscular dysplasia: Secondary | ICD-10-CM | POA: Diagnosis not present

## 2015-07-11 DIAGNOSIS — G43809 Other migraine, not intractable, without status migrainosus: Secondary | ICD-10-CM | POA: Diagnosis not present

## 2015-07-17 DIAGNOSIS — R002 Palpitations: Secondary | ICD-10-CM | POA: Diagnosis not present

## 2015-08-17 DIAGNOSIS — R58 Hemorrhage, not elsewhere classified: Secondary | ICD-10-CM | POA: Diagnosis not present

## 2015-08-17 DIAGNOSIS — D699 Hemorrhagic condition, unspecified: Secondary | ICD-10-CM | POA: Diagnosis not present

## 2015-08-17 DIAGNOSIS — D682 Hereditary deficiency of other clotting factors: Secondary | ICD-10-CM | POA: Diagnosis not present

## 2015-08-17 DIAGNOSIS — R3129 Other microscopic hematuria: Secondary | ICD-10-CM | POA: Diagnosis not present

## 2015-08-17 DIAGNOSIS — Z681 Body mass index (BMI) 19 or less, adult: Secondary | ICD-10-CM | POA: Diagnosis not present

## 2015-08-28 DIAGNOSIS — I773 Arterial fibromuscular dysplasia: Secondary | ICD-10-CM | POA: Diagnosis not present

## 2015-09-17 ENCOUNTER — Other Ambulatory Visit: Payer: Self-pay | Admitting: Internal Medicine

## 2015-10-02 ENCOUNTER — Other Ambulatory Visit: Payer: Self-pay | Admitting: Internal Medicine

## 2015-10-02 NOTE — Telephone Encounter (Signed)
Last OV was 12/13/2014? Please advise?

## 2015-10-03 NOTE — Telephone Encounter (Signed)
Ok to refill,  Refill sent  

## 2015-10-05 DIAGNOSIS — R319 Hematuria, unspecified: Secondary | ICD-10-CM | POA: Diagnosis not present

## 2015-10-05 DIAGNOSIS — N390 Urinary tract infection, site not specified: Secondary | ICD-10-CM | POA: Diagnosis not present

## 2015-10-05 DIAGNOSIS — Z0000 Encounter for general adult medical examination without abnormal findings: Secondary | ICD-10-CM | POA: Diagnosis not present

## 2015-10-05 DIAGNOSIS — Z681 Body mass index (BMI) 19 or less, adult: Secondary | ICD-10-CM | POA: Diagnosis not present

## 2015-10-16 ENCOUNTER — Other Ambulatory Visit: Payer: Self-pay | Admitting: Internal Medicine

## 2015-11-01 DIAGNOSIS — I773 Arterial fibromuscular dysplasia: Secondary | ICD-10-CM | POA: Diagnosis not present

## 2015-11-01 DIAGNOSIS — G459 Transient cerebral ischemic attack, unspecified: Secondary | ICD-10-CM | POA: Diagnosis not present

## 2015-11-01 DIAGNOSIS — Z961 Presence of intraocular lens: Secondary | ICD-10-CM | POA: Diagnosis not present

## 2015-11-01 DIAGNOSIS — I7789 Other specified disorders of arteries and arterioles: Secondary | ICD-10-CM | POA: Diagnosis not present

## 2015-11-14 ENCOUNTER — Other Ambulatory Visit: Payer: Self-pay | Admitting: Internal Medicine

## 2015-11-15 NOTE — Telephone Encounter (Signed)
90 day supply authorized and sent   

## 2015-11-19 ENCOUNTER — Ambulatory Visit (INDEPENDENT_AMBULATORY_CARE_PROVIDER_SITE_OTHER): Payer: Medicare Other | Admitting: Internal Medicine

## 2015-11-19 ENCOUNTER — Encounter: Payer: Self-pay | Admitting: Internal Medicine

## 2015-11-19 VITALS — BP 104/64 | HR 62 | Temp 98.1°F | Resp 12 | Ht 63.0 in | Wt 104.5 lb

## 2015-11-19 DIAGNOSIS — I10 Essential (primary) hypertension: Secondary | ICD-10-CM

## 2015-11-19 DIAGNOSIS — E559 Vitamin D deficiency, unspecified: Secondary | ICD-10-CM

## 2015-11-19 DIAGNOSIS — M88852 Osteitis deformans of left thigh: Secondary | ICD-10-CM

## 2015-11-19 DIAGNOSIS — E038 Other specified hypothyroidism: Secondary | ICD-10-CM | POA: Diagnosis not present

## 2015-11-19 DIAGNOSIS — Z111 Encounter for screening for respiratory tuberculosis: Secondary | ICD-10-CM | POA: Diagnosis not present

## 2015-11-19 DIAGNOSIS — E034 Atrophy of thyroid (acquired): Secondary | ICD-10-CM | POA: Diagnosis not present

## 2015-11-19 DIAGNOSIS — M8889 Osteitis deformans of multiple sites: Secondary | ICD-10-CM | POA: Insufficient documentation

## 2015-11-19 DIAGNOSIS — R911 Solitary pulmonary nodule: Secondary | ICD-10-CM | POA: Diagnosis not present

## 2015-11-19 DIAGNOSIS — E785 Hyperlipidemia, unspecified: Secondary | ICD-10-CM

## 2015-11-19 MED ORDER — TRAMADOL HCL 50 MG PO TABS: 50.0000 mg | ORAL_TABLET | Freq: Three times a day (TID) | ORAL | Status: DC | PRN

## 2015-11-19 MED ORDER — NEBIVOLOL HCL 2.5 MG PO TABS: 2.5000 mg | ORAL_TABLET | Freq: Every day | ORAL | Status: DC

## 2015-11-19 NOTE — Progress Notes (Signed)
Pre-visit discussion using our clinic review tool. No additional management support is needed unless otherwise documented below in the visit note.  

## 2015-11-19 NOTE — Patient Instructions (Signed)
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

## 2015-11-19 NOTE — Progress Notes (Signed)
Subjective:  Patient ID: Hailey Hunt, female    DOB: 12/12/1945  Age: 70 y.o. MRN: [id number]  CC: The primary encounter diagnosis was Solitary pulmonary nodule. 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  HPI [name] presents for follwow up on chronic issues including FMD.  During recent imaging studies done at [facility], and  Incidental finding of  Paget's disease of the left hip was noted. She has had pain in the left hip for years which would flare  .     prior treatment of osteoporosis with risedronate years ago stopped after 2 years  DUE TO RECURRENT KIDNEY STONES  No loss of height,  No history of fractures. .   Discuss referral to dr.  Cruzita Lederer.   2) 5 mm lung nodule.  Mother  Had latent TB , contracted during work in a [employer].   Became active when she turned 64 and was treated.  Patient has had extensive TB testing  has always been negative.  Requesitng additinla testing    3)  New onset headaches.   Had vision checked,  20/20 .  imporved with changing her reading glasses.  Had CTA  Done .to rule out aneurysm   Thinks the headaches  may be due to  amlodipine.  Stopped hctz due to dehydration.  Did not tolerate atenolol either. Using inderal.   4) Recovering from a URi that lasted two weeks , included fever  to 101 ,  Sinus drainage yellow,  Missed 2.5 days of work .    Outpatient Prescriptions Prior to Visit  Medication Sig Dispense Refill  . cholecalciferol (VITAMIN D) 1000 UNITS tablet Take 1,000 Units by mouth daily.    . cyanocobalamin (,VITAMIN B-12,) 1000 MCG/ML injection INJECT ONE ML (1000MCG) IM EVERY MONTH 10 mL 3  . levothyroxine (SYNTHROID, LEVOTHROID) 50 MCG tablet Take 1 tablet (50 mcg total) by mouth daily before breakfast. 90 tablet 1  . SYNTHROID 50  MCG tablet TAKE 1 TABLET EVERY DAY BEFORE BREAKFAST 90 tablet 1  . Syringe/Needle, Disp, (SYRINGE 3CC/27GX1-1/4") 27G X 1-1/4" 3 ML MISC For B12 injections 100 each 0  . amLODipine (NORVASC) 2.5 MG tablet TAKE 1 TABLET EVERY DAY 90 tablet 1  . propranolol (INDERAL) 10 MG tablet TAKE ONE TABLET TWICE A DAY 60 tablet 3  . traMADol (ULTRAM) 50 MG tablet Take 1 tablet (50 mg total) by mouth every 8 (eight) hours as needed. 30 tablet 0  . Probiotic Product (TRUBIOTICS PO) Take 1 capsule by mouth daily. Reported on 11/19/2015    . adapalene (DIFFERIN) 0.1 % cream Apply topically at bedtime.     No facility-administered medications prior to visit.    Review of Systems;  Patient denies headache, fevers, malaise, unintentional weight loss, skin rash, eye pain, sinus congestion and sinus pain, sore throat, dysphagia,  hemoptysis , cough, dyspnea, wheezing, chest pain, palpitations, orthopnea, edema, abdominal pain, nausea, melena, diarrhea, constipation, flank pain, dysuria, hematuria, urinary  Frequency, nocturia, numbness, tingling, seizures,  Focal weakness, Loss of consciousness,  Tremor, insomnia, depression, anxiety, and suicidal ideation.      Objective:  BP 104/64 mmHg  Pulse 62  Temp(Src) 98.1 F (36.7 C) (Oral)  Resp 12  Ht 5\' 3"  (1.6 m)  Wt 104 lb 8 oz (47.401 kg)  BMI 18.52 kg/m2  SpO2 98%  BP Readings from Last 3 Encounters:  11/19/15 104/64  02/20/15 112/64  12/13/14 118/78    Wt Readings from Last 3 Encounters:  11/19/15 104 lb 8 oz (47.401 kg)  02/20/15 110 lb 4 oz (50.009 kg)  12/13/14 108 lb 1.9 oz (49.043 kg)    General appearance: alert, cooperative and appears stated age Ears: normal TM's and external ear canals both ears Throat: lips, mucosa, and tongue normal; teeth and gums normal Neck: no adenopathy, no carotid bruit, supple, symmetrical, trachea midline and thyroid not enlarged, symmetric, no tenderness/mass/nodules Back: symmetric, no curvature. ROM normal.  No CVA tenderness. Lungs: clear to auscultation bilaterally Heart: regular rate and rhythm, S1, S2 normal, no murmur, click, rub or gallop Abdomen: soft, non-tender; bowel sounds normal; no masses,  no organomegaly Pulses: 2+ and symmetric Skin: Skin color, texture, turgor normal. No rashes or lesions Lymph nodes: Cervical, supraclavicular, and axillary nodes normal.  No results found for: HGBA1C  Lab Results  Component Value Date   CREATININE 0.95 11/19/2015   CREATININE 1.00 03/28/2015   CREATININE 0.9 08/14/2014    Lab Results  Component Value Date   WBC 6.6 08/29/2013   HGB 12.4 08/29/2013   HCT 37.3 08/29/2013   PLT 308.0 08/29/2013   GLUCOSE 82 11/19/2015   CHOL 209* 08/14/2014   TRIG 55.0 08/14/2014   HDL 71.90 08/14/2014   LDLDIRECT 92.0 11/19/2015   LDLCALC 126* 08/14/2014   ALT 12 11/19/2015   AST 19 11/19/2015   NA 142 11/19/2015   K 4.1 11/19/2015   CL 106 11/19/2015   CREATININE 0.95 11/19/2015   BUN 13 11/19/2015   CO2 27 11/19/2015   TSH 0.85 11/19/2015   MICROALBUR 0.2 09/07/2013    No results found.  Assessment & Plan:   Problem List Items Addressed This Visit    Hypertension    Changing regimen to Bystolic per patient request for alternative to amlodipine due to new onset daily headaches.       Relevant Medications   nebivolol (BYSTOLIC) 2.5 MG tablet   Solitary pulmonary nodule - Primary    5 mm by Oct scan,  Annual surveillance advised, .  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 The decision to treat would be problematic given prior history of renal calculi during therapy for osteopororis with alendronate.  Referral to Dr Renne Crigler for opinion .        Relevant Orders   Comprehensive metabolic panel (Completed)   Ambulatory referral to Endocrinology   Vitamin D deficiency   Relevant Orders   VITAMIN D 25 Hydroxy (Vit-D Deficiency,  Fractures) (Completed)    Other Visit Diagnoses    Hyperlipidemia        Relevant Medications    nebivolol (BYSTOLIC) 2.5 MG tablet    Other Relevant Orders    LDL cholesterol, direct (Completed)    Hypothyroidism due to acquired atrophy of thyroid        Relevant Medications    nebivolol (BYSTOLIC) 2.5 MG tablet    Other Relevant Orders    TSH (Completed)    Screening for tuberculosis        Relevant Orders    PPD (Completed)      A total of 40 minutes was spent with patient more than half of which was spent in counseling patient on  the above mentioned issues , reviewing and explaining recent labs and imaging studies done, and coordination of care.   I have discontinued Ms. Aker's adapalene, propranolol, and amLODipine. I am also having her start on nebivolol. Additionally, I am having her maintain her SYRINGE 3CC/27GX1-1/4", Probiotic Product (TRUBIOTICS PO), cholecalciferol, levothyroxine, SYNTHROID, cyanocobalamin, and traMADol.  Meds ordered this encounter  Medications  . DISCONTD: tretinoin (RETIN-A) 0.025 % cream    Sig: Apply 1 application topically at bedtime.  . nebivolol (BYSTOLIC) 2.5 MG tablet    Sig: Take 1 tablet (2.5 mg total) by mouth daily.    Dispense:  30 tablet    Refill:  4  . traMADol (ULTRAM) 50 MG tablet    Sig: Take 1 tablet (50 mg total) by mouth every 8 (eight) hours as needed.    Dispense:  30 tablet    Refill:  3    Medications Discontinued During This Encounter  Medication Reason  . adapalene (DIFFERIN) 0.1 % cream Change in therapy  . amLODipine (NORVASC) 2.5 MG tablet   . propranolol (INDERAL) 10 MG tablet   . traMADol (ULTRAM) 50 MG tablet Reorder    Follow-up: Return in about 4 weeks (around 12/17/2015) for annual .   Crecencio Mc, MD

## 2015-11-20 ENCOUNTER — Other Ambulatory Visit: Payer: Self-pay

## 2015-11-20 LAB — COMPREHENSIVE METABOLIC PANEL
ALT: 12 U/L (ref 0–35)
AST: 19 U/L (ref 0–37)
Albumin: 4.4 g/dL (ref 3.5–5.2)
Alkaline Phosphatase: 73 U/L (ref 39–117)
BUN: 13 mg/dL (ref 6–23)
CO2: 27 mEq/L (ref 19–32)
Calcium: 9.8 mg/dL (ref 8.4–10.5)
Chloride: 106 mEq/L (ref 96–112)
Creatinine, Ser: 0.95 mg/dL (ref 0.40–1.20)
GFR: 62.31 mL/min (ref >60.00)
Glucose, Bld: 82 mg/dL (ref 70–99)
Potassium: 4.1 mEq/L (ref 3.5–5.1)
Sodium: 142 mEq/L (ref 135–145)
Total Bilirubin: 0.6 mg/dL (ref 0.2–1.2)
Total Protein: 6.7 g/dL (ref 6.0–8.3)

## 2015-11-20 LAB — VITAMIN D 25 HYDROXY (VIT D DEFICIENCY, FRACTURES): VITD: 49.43 ng/mL (ref 30.00–100.00)

## 2015-11-20 LAB — LDL CHOLESTEROL, DIRECT: Direct LDL: 92 mg/dL

## 2015-11-20 LAB — TSH: TSH: 0.85 u[IU]/mL (ref 0.35–4.50)

## 2015-11-20 MED ORDER — TRETINOIN 0.025 % EX CREA: 1.0000 "application " | TOPICAL_CREAM | Freq: Every day | CUTANEOUS | Status: DC

## 2015-11-20 NOTE — Telephone Encounter (Signed)
Pt needed a refill on Retin -A

## 2015-11-20 NOTE — Assessment & Plan Note (Signed)
5 mm by Oct scan,  Annual surveillance advised, .  Given mother's history of latent TB,  Will repeat PPD

## 2015-11-20 NOTE — Assessment & Plan Note (Signed)
Involving the left iliac crest . The decision to treat would be problematic given prior history of renal calculi during therapy for osteopororis with alendronate.  Referral to Dr Renne Crigler for opinion .

## 2015-11-20 NOTE — Assessment & Plan Note (Signed)
Changing regimen to Bystolic per patient request for alternative to amlodipine due to new onset daily headaches.

## 2015-11-21 ENCOUNTER — Encounter: Payer: Self-pay | Admitting: Internal Medicine

## 2015-11-21 DIAGNOSIS — Z111 Encounter for screening for respiratory tuberculosis: Secondary | ICD-10-CM

## 2015-11-21 LAB — TB SKIN TEST
Induration: 0 mm
TB Skin Test: NEGATIVE

## 2015-11-21 NOTE — Telephone Encounter (Signed)
Patient requires PA for Bystolic, proceed with PA or generic? PPD has been updated.

## 2015-11-23 ENCOUNTER — Encounter: Payer: Self-pay | Admitting: Internal Medicine

## 2015-11-28 ENCOUNTER — Telehealth: Payer: Self-pay | Admitting: Internal Medicine

## 2015-11-28 NOTE — Telephone Encounter (Signed)
Please advise, thanks.

## 2015-11-28 NOTE — Telephone Encounter (Signed)
PA medical questions completed on the phone.

## 2015-11-28 NOTE — Telephone Encounter (Signed)
[employer] called regarding Prior auth for prescription it was denied. Call back @ [phone] and check fax for today. Thank you!

## 2015-11-28 NOTE — Telephone Encounter (Signed)
I spoke with [facility] to answer clinical questions around 130 today.  I am going to hold this till I see a fax with denial.  thanks

## 2015-11-29 NOTE — Telephone Encounter (Signed)
The others cannot be used because of her slow heart rate .  Please try again

## 2015-11-29 NOTE — Telephone Encounter (Signed)
PA was denied for bystolic as two formulary alternatives have not been tried.  She had documentation for trying Propranolol and amlodipine only.  Please advise other options were: atenolol, carvedilol, metoprolol, or pindolol.  Thanks

## 2015-11-30 ENCOUNTER — Telehealth: Payer: Self-pay

## 2015-11-30 NOTE — Telephone Encounter (Signed)
Hailey Hunt at [place] back to clarify.  Thanks He will follow up on Monday with me

## 2015-11-30 NOTE — Telephone Encounter (Signed)
Appeal submitted to [facility]. Thanks

## 2015-11-30 NOTE — Telephone Encounter (Signed)
-----   Message from Ivonne Andrew, NT sent at 11/30/2015  3:26 PM EST ----- Regarding: Medication appeal Contact: [phone] [name] from [place] has requested a call , with a couple of questions pertaining to the medication appeal for the pt.

## 2015-12-03 NOTE — Telephone Encounter (Signed)
Spoke with [name] at [place], [place] approved from today to 11/26/2016.   Called patient to make her aware, she was thrilled!!

## 2015-12-13 ENCOUNTER — Encounter: Payer: Self-pay | Admitting: Internal Medicine

## 2015-12-13 ENCOUNTER — Ambulatory Visit (INDEPENDENT_AMBULATORY_CARE_PROVIDER_SITE_OTHER): Payer: Medicare Other | Admitting: Internal Medicine

## 2015-12-13 VITALS — BP 102/60 | HR 62 | Temp 97.8°F | Resp 12 | Wt 106.0 lb

## 2015-12-13 DIAGNOSIS — M8580 Other specified disorders of bone density and structure, unspecified site: Secondary | ICD-10-CM | POA: Diagnosis not present

## 2015-12-13 DIAGNOSIS — E890 Postprocedural hypothyroidism: Secondary | ICD-10-CM

## 2015-12-13 DIAGNOSIS — M810 Age-related osteoporosis without current pathological fracture: Secondary | ICD-10-CM | POA: Insufficient documentation

## 2015-12-13 DIAGNOSIS — M88852 Osteitis deformans of left thigh: Secondary | ICD-10-CM

## 2015-12-13 NOTE — Patient Instructions (Signed)
Will order a Bone scan for you. Please let me know if you are not called to schedule this in the next week.  Please continue Synthroid 50 mcg daily.  Take the thyroid hormone every day, with water, at least 30 minutes before breakfast, separated by at least 4 hours from: - acid reflux medications - calcium - iron - multivitamins  Please return in 1 year.

## 2015-12-13 NOTE — Progress Notes (Addendum)
Patient ID: Hailey Hunt, female   DOB: December 03, 1945, 70 y.o.   MRN: [id number]   HPI  Hailey Hunt is a 70 y.o.-year-old female, referred by her PCP, Dr. Derrel Nip, for management of Paget ds and pt would also want me to manage her postablative hypothyroidism. She was previously seen by Dr Howell Rucks for hypothyroidism.   She had a CTA abd/pelvis on 06/2015:incidental lesion:  Diffuse patchy sclerosis and cortical thickening of the left iliac bone, favored to represent Paget's disease.  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 She has had pain in that his ever since. She is not bothered by the pain, but she may have flares years apart, sometimes with exertion.   I reviewed pt's DEXA scans - osteopenia at the level of the left femoral: Date L1-L4 T score FN T score  01/22/2012 ([facility])  -0.60 LFN: -0.66    She denies fractures (traumatic vb fracture as a child) or falls.  She had dizziness.  She has been on the following OP treatments:  - Actonel for 2 years. This was apparently stopped due to recurrent kidney stones - last episode 2010.  Last vit D level was: 49.43 on 11/18/2005. Previous vitamin D 46.46 a year ago. She was on the high dose vitamin D dose, now on 1000 units a day.  She eats dairy and green leafy vegetables.  No weight bearing exercises. She walks.   She does not take high vitamin A doses.  Surgical menopause was at 70 y/o.   Pt does not have a FH of osteoporosis.  No h/o hyper/hypocalcemia or hyperparathyroidism.  Lab Results  Component Value Date   CALCIUM 9.8 11/19/2015   CALCIUM 10.0 03/28/2015   CALCIUM 9.7 08/14/2014   CALCIUM 9.9 07/06/2014   CALCIUM 9.9 08/29/2013   CALCIUM 8.9 03/03/2012   CALCIUM 10.0 01/20/2012   She also has a history of Graves' disease, status post RAI treatment in the 3s. 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  She takes the Synthroid: -  Fasting - In a.m. - Separated by at least one hour from food - No MVI, PPI, calcium, iron  Reviewed TSH recent levels:  Lab Results  Component Value Date   TSH 0.85 11/19/2015   TSH 0.708 03/28/2015   TSH 1.62 01/03/2015   TSH 7.660* 10/18/2014   TSH 23.17* 08/14/2014   + h/o CKD from [facility]. Last BUN/Cr: Lab Results  Component Value Date   BUN 13 11/19/2015   CREATININE 0.95 11/19/2015    ROS: Constitutional: no weight gain/loss, no fatigue, no subjective hyperthermia/hypothermia, + nocturia Eyes: no blurry vision, no xerophthalmia ENT: no sore throat, no nodules palpated in throat, no dysphagia/odynophagia, no hoarseness Cardiovascular: no CP/SOB/palpitations/leg swelling Respiratory: no cough/SOB Gastrointestinal: no N/V/D/C Musculoskeletal: + muscle/+ joint aches Skin: no rashes Neurological: no tremors/numbness/tingling/dizziness Psychiatric: no depression/anxiety  Past Medical History  Diagnosis Date  . Kidney stone   . Hypertension   . Fibromuscular dysplasia of renal artery (HCC)     managed by Dr. Gregary Signs [facility]phrology  . Neuropathy with IgA monoclonal gammopathy (HCC)     Dr Mervyn Skeeters  . Dysplasia, artery, fibromuscular, renal ([place])   . Urinary incontinence, mixed     resolved with removal of ained sutures in bladder    Past Surgical History  Procedure Laterality Date  . Cataract extraction  Sep 10, 2011, Jan 13,2013  . Renal biopsy    .  Vaginal delivery      x2  . Vaginal hysterectomy     Social History   Social History  . Marital Status: Married    Spouse Name: N/A  . Number of Children: 2  . 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 Smoking status: Never Smoker   . Smokeless tobacco: Never Used  . Alcohol Use: No  . Drug Use: No  . Sexual Activity: Not Currently   Other Topics Concern  . Not on file   Social History Narrative   Current Outpatient Prescriptions on File Prior  to Visit  Medication Sig Dispense Refill  . cholecalciferol (VITAMIN D) 1000 UNITS tablet Take 1,000 Units by mouth daily.    . cyanocobalamin (,VITAMIN B-12,) 1000 MCG/ML injection INJECT ONE ML (1000MCG) IM EVERY MONTH 10 mL 3  . nebivolol (BYSTOLIC) 2.5 MG tablet Take 1 tablet (2.5 mg total) by mouth daily. 30 tablet 4  . SYNTHROID 50 MCG tablet TAKE 1 TABLET EVERY DAY BEFORE BREAKFAST 90 tablet 1  . Syringe/Needle, Disp, (SYRINGE 3CC/27GX1-1/4") 27G X 1-1/4" 3 ML MISC For B12 injections 100 each 0  . traMADol (ULTRAM) 50 MG tablet Take 1 tablet (50 mg total) by mouth every 8 (eight) hours as needed. 30 tablet 3  . tretinoin (RETIN-A) 0.025 % cream Apply 1 application topically at bedtime. 45 g 6   No current facility-administered medications on file prior to visit.   Allergies  Allergen Reactions  . Voltaren [Diclofenac] Hypertension  . Adhesive [Tape]   . Demerol     HYPOTENSION  . Macrodantin   . Nsaids Hypertension  . Codeine Nausea And Vomiting and Rash  . Nickel Rash  . Penicillins Rash  . Sulfa Antibiotics Rash    Fever   Family History  Problem Relation Age of Onset  . Anuerysm Mother   . COPD Mother   . Hypertension Mother   . Heart disease Mother   . Cancer Father     lung CA  . Heart disease Father   . Heart disease Brother     viral cardiomyopathy, cocaine use  . Drug abuse Brother   . Cancer Maternal Aunt     esophageal and live rCA,  hisotory of  ETOH and tobacco  . Anuerysm Maternal Grandfather   . Cancer Paternal Grandmother     brain tumor  . Diabetes Sister    PE: BP 102/60 mmHg  Pulse 62  Temp(Src) 97.8 F (36.6 C) (Oral)  Resp 12  Wt 106 lb (48.081 kg)  SpO2 97% Body mass index is 18.78 kg/(m^2). Wt Readings from Last 3 Encounters:  12/13/15 106 lb (48.081 kg)  11/19/15 104 lb 8 oz (47.401 kg)  02/20/15 110 lb 4 oz (50.009 kg)   Constitutional:Normal weight, in NAD. No kyphosis. Eyes: PERRLA, EOMI, no exophthalmos ENT: moist mucous  membranes, no thyromegaly, no cervical lymphadenopathy Cardiovascular: RRR, No MRG Respiratory: CTA B Gastrointestinal: abdomen soft, NT, ND, BS+ Musculoskeletal: no deformities, strength intact in all 4 Skin: moist, warm, no rashes Neurological: no tremor with outstretched hands, DTR normal in all 4  Assessment: 1. Paget disease  2. Osteopenia  3. Postablative hypothyroidism - RAI tx for Graves ds. In 1980s  Plan: 1. Paget ds. - This is a new diagnosis for the patient: Lesion in L hip observed incidentally on pelvic CTA at [facility] >> will obtain images >> patient signed a release  of information form. - We discussed about the fact that Paget disease is usually localized disorder, and only advanced cases will evolve towards enlargement of the bones, fractures, heart failure. I reviewed her chart and she does not have any elevation in her alkaline phosphatase enzyme (and no other signs of cholestasis to prompt further evaluation with bone specific alkaline phosphatase or other bone formation markers), she also does not have significant, new, pain in her hip, or warmth at the site. She would like to avoid bisphosphonates 2/2 previous kidney stones >> agree that these are not needed for now with normal Aphos, no change in character of her hip pain (she has a rotated hip >> pain since her 60s - flares- no recent one) - but I would like to check the images of the hip on her 06/2015 CT and also to check a bone scan. - will see her back in 1 year  2. Osteopenia - At the level of left hip - We discussed about increasing the level of exercise - She has a normal vitamin D, on supplementation and she also gets a good amount of calcium in her diet per review of her daily diet. - No further intervention needed for now  3. Postablative hypothyroidism - on brand name Synthroid - latest TFTs normal as reviewed with the pt - advised her to take the thyroid hormone every day, with water, at least 30 minutes  before breakfast, separated by at least 4 hours from: - acid reflux medications - calcium - iron - multivitamins She is taking it correctly - will recheck in 1 year  - time spent with the patient: 40 min, of which >50% was spent in obtaining information about her symptoms, reviewing her previous labs, evaluations, and treatments, counseling her about her condition (please see the discussed topics above), and developing a plan to further investigate it; she had a number of questions which I addressed.  Addendum (01/02/2016): Bone scan: FINDINGS: There is asymmetric increased uptake throughout the left iliac bone. The appearance and distribution is compatible with Paget's disease. Increased uptake in the distribution of the nasal bone is also noted, nonspecific. This could be seen with Paget's disease, fibrous dysplasia or chronic sinusitis. No findings identified to suggest bone metastases. Physiologic uptake is noted within the kidneys and urinary bladder.  IMPRESSION: 1. Increased uptake within the left iliac bone compatible with Paget's disease. 2. Central area of increased uptake in the region of the nasal bone which may also reflect Paget's disease of the skull.   Electronically Signed  By: Kerby Moors M.D.  On: 12/31/2015 14:43  2 foci of Paget disease, one in the left iliac bone, the other a nasal bone. No intervention needed for now, as none of the 2 foci are contiguous with a joint or appear symptomatic, but I will continue to check her alkaline phosphatase for disease activity.

## 2015-12-18 ENCOUNTER — Telehealth: Payer: Self-pay | Admitting: Internal Medicine

## 2015-12-18 ENCOUNTER — Encounter: Payer: Self-pay | Admitting: Internal Medicine

## 2015-12-18 ENCOUNTER — Ambulatory Visit (INDEPENDENT_AMBULATORY_CARE_PROVIDER_SITE_OTHER): Payer: Medicare Other | Admitting: Internal Medicine

## 2015-12-18 VITALS — BP 104/62 | HR 62 | Temp 98.0°F | Resp 12 | Ht 63.5 in | Wt 105.2 lb

## 2015-12-18 DIAGNOSIS — I10 Essential (primary) hypertension: Secondary | ICD-10-CM

## 2015-12-18 DIAGNOSIS — I471 Supraventricular tachycardia: Secondary | ICD-10-CM

## 2015-12-18 DIAGNOSIS — Z681 Body mass index (BMI) 19 or less, adult: Secondary | ICD-10-CM | POA: Diagnosis not present

## 2015-12-18 DIAGNOSIS — Z0000 Encounter for general adult medical examination without abnormal findings: Secondary | ICD-10-CM

## 2015-12-18 MED ORDER — LISINOPRIL 2.5 MG PO TABS: 2.5000 mg | ORAL_TABLET | Freq: Every day | ORAL | Status: DC

## 2015-12-18 MED ORDER — LOSARTAN POTASSIUM 25 MG PO TABS: 25.0000 mg | ORAL_TABLET | Freq: Every day | ORAL | Status: DC

## 2015-12-18 NOTE — Patient Instructions (Addendum)
Let me know when you choose between losartan   and cheap lisinopril for blood pressure  Referral to Dr Rockey Situ for cardiac issues . Keep track of how often the runs are recurring

## 2015-12-18 NOTE — Progress Notes (Signed)
Pre-visit discussion using our clinic review tool. No additional management support is needed unless otherwise documented below in the visit note.  

## 2015-12-18 NOTE — Telephone Encounter (Signed)
Pt called to let Dr. Derrel Nip know she is taking losartan (COZAAR) 25 MG tablet and it was $4.00.

## 2015-12-18 NOTE — Progress Notes (Addendum)
Patient ID: Hailey Hunt, female    DOB: 09/18/1946  Age: 70 y.o. MRN: [id number]  The patient is here for annual Medicare wellness examination and management of other chronic and acute problems.   The risk factors are reflected in the social history.  The roster of all physicians providing medical care to patient - is listed in the Snapshot section of the chart.  Activities of daily living:  The patient is 100% independent in all ADLs: dressing, toileting, feeding as well as independent mobility  Home safety : The patient has smoke detectors in the home. They wear seatbelts.  There are no firearms at home. There is no violence in the home.   There is no risks for hepatitis, STDs or HIV. There is no   history of blood transfusion. They have no travel history to infectious disease endemic areas of the world.  The patient has seen their dentist in the last six month. They have seen their eye doctor in the last year. They admit to slight hearing difficulty with regard to whispered voices and some television programs.  They have deferred audiologic testing in the last year.  They do not  have excessive sun exposure. Discussed the need for sun protection: hats, long sleeves and use of sunscreen if there is significant sun exposure.   Diet: the importance of a healthy diet is discussed. They do have a healthy diet.  The benefits of regular aerobic exercise were discussed. She walks 4 times per week ,  20 minutes.   Depression screen: there are no signs or vegative symptoms of depression- irritability, change in appetite, anhedonia, sadness/tearfullness.  Cognitive assessment: the patient manages all their financial and personal affairs and is actively engaged. They could relate day,date,year and events; recalled 2/3 objects at 3 minutes; performed clock-face test normally.  The following portions of the patient's history were reviewed and updated as appropriate: allergies, current medications,  past family history, past medical history,  past surgical history, past social history  and problem list.  Visual acuity was not assessed per patient preference since she has regular follow up with her ophthalmologist. Hearing and body mass index were assessed and reviewed.   During the course of the visit the patient was educated and counseled about appropriate screening and preventive services including : fall prevention , diabetes screening, nutrition counseling, colorectal cancer screening, and recommended immunizations.    CC: The primary encounter diagnosis was Medicare annual wellness visit, subsequent. Diagnoses of Essential hypertension and PSVT (paroxysmal supraventricular tachycardia) (HCC) were also pertinent to this visit.   Bradycardia on bystolic,  Ok to use losartan per nephrology .  Needs cardiac workup.  Having runs of tachycardia lasting < 2 minutes  3 times weekly,  Puts her into a panic attack Doesn't happen climbing stairs,  Frequent wakenings at 1:30 every night not sure if happening then.   25 hr holter montir done at [facility] was not diagnositc  Wants to see Ida Rogue.    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   She has past surgical history that includes Cataract extraction (Sep 10, 2011, Jan 13,2013); Renal biopsy; Vaginal delivery; and Vaginal hysterectomy.   Her family history includes Anuerysm in her maternal grandfather and mother; COPD in her mother; Cancer in her father, maternal aunt, and paternal grandmother; Diabetes in  her sister; Drug abuse in her brother; Heart disease in her brother, father, and mother; Hypertension in her mother.She reports that she has never smoked. She has never used smokeless tobacco. She reports that she does not drink alcohol  or use illicit drugs.  Outpatient Prescriptions Prior to Visit  Medication Sig Dispense Refill  . cholecalciferol (VITAMIN D) 1000 UNITS tablet Take 1,000 Units by mouth daily.    . cyanocobalamin (,VITAMIN B-12,) 1000 MCG/ML injection INJECT ONE ML (1000MCG) IM EVERY MONTH 10 mL 3  . SYNTHROID 50 MCG tablet TAKE 1 TABLET EVERY DAY BEFORE BREAKFAST 90 tablet 1  . Syringe/Needle, Disp, (SYRINGE 3CC/27GX1-1/4") 27G X 1-1/4" 3 ML MISC For B12 injections 100 each 0  . traMADol (ULTRAM) 50 MG tablet Take 1 tablet (50 mg total) by mouth every 8 (eight) hours as needed. 30 tablet 3  . tretinoin (RETIN-A) 0.025 % cream Apply 1 application topically at bedtime. 45 g 6  . nebivolol (BYSTOLIC) 2.5 MG tablet Take 1 tablet (2.5 mg total) by mouth daily. 30 tablet 4   No facility-administered medications prior to visit.    Review of Systems   Patient denies headache, fevers, malaise, unintentional weight loss, skin rash, eye pain, sinus congestion and sinus pain, sore throat, dysphagia,  hemoptysis , cough, dyspnea, wheezing, chest pain, palpitations, orthopnea, edema, abdominal pain, nausea, melena, diarrhea, constipation, flank pain, dysuria, hematuria, urinary  Frequency, nocturia, numbness, tingling, seizures,  Focal weakness, Loss of consciousness,  Tremor, insomnia, depression, anxiety, and suicidal ideation.      Objective:  BP 104/62 mmHg  Pulse 62  Temp(Src) 98 F (36.7 C) (Oral)  Resp 12  Ht 5' 3.5" (1.613 m)  Wt 105 lb 4 oz (47.741 kg)  BMI 18.35 kg/m2  SpO2 98%  Physical Exam   General appearance: alert, cooperative and appears stated age Ears: normal TM's and external ear canals both ears Throat: lips, mucosa, and tongue normal; teeth and gums normal Neck: no adenopathy, no carotid bruit, supple, symmetrical, trachea midline and thyroid not enlarged, symmetric, no tenderness/mass/nodules Back: symmetric, no curvature. ROM normal. No CVA tenderness. Lungs: clear to auscultation  bilaterally Heart: regular rate and rhythm, S1, S2 normal, no murmur, click, rub or gallop Abdomen: soft, non-tender; bowel sounds normal; no masses,  no organomegaly Pulses: 2+ and symmetric Skin: Skin color, texture, turgor normal. No rashes or lesions Lymph nodes: Cervical, supraclavicular, and axillary nodes normal.    Assessment & Plan:   Problem List Items Addressed This Visit    Hypertension    She has developed symptomatic bradycardia on Bystolic.  Changing to losartan 25 mg daily.       Relevant Medications   losartan (COZAAR) 25 MG tablet   Medicare annual wellness visit, subsequent - Primary    Annual Medicare wellness  exam was done as well as a comprehensive physical exam and management of acute and chronic conditions .  During the course of the visit the patient was educated and counseled about appropriate screening and preventive services including : fall prevention , diabetes screening, nutrition counseling, colorectal cancer screening, and recommended immunizations.  Printed recommendations for health maintenance screenings was given.       PSVT (paroxysmal supraventricular tachycardia) (HCC)    Occurring up to 3 times weekly , with prior non diagnostic 24 Hr Holter monitor done at [facility]. Referring ot Esmond Plants        Relevant Medications   losartan (COZAAR) 25 MG tablet   Other Relevant Orders  Ambulatory referral to Cardiology      I have discontinued Ms. Sanroman's nebivolol. I am also having her start on losartan. Additionally, I am having her maintain her SYRINGE 3CC/27GX1-1/4", cholecalciferol, SYNTHROID, cyanocobalamin, traMADol, and tretinoin.  Meds ordered this encounter  Medications  . losartan (COZAAR) 25 MG tablet    Sig: Take 1 tablet (25 mg total) by mouth daily.    Dispense:  30 tablet    Refill:  5  . DISCONTD: lisinopril (ZESTRIL) 2.5 MG tablet    Sig: Take 1 tablet (2.5 mg total) by mouth daily.    Dispense:  30 tablet    Refill:  5     Medications Discontinued During This Encounter  Medication Reason  . nebivolol (BYSTOLIC) 2.5 MG tablet     Follow-up: No Follow-up on file.   Crecencio Mc, MD

## 2015-12-19 DIAGNOSIS — I471 Supraventricular tachycardia: Secondary | ICD-10-CM | POA: Insufficient documentation

## 2015-12-19 DIAGNOSIS — Z0000 Encounter for general adult medical examination without abnormal findings: Secondary | ICD-10-CM | POA: Insufficient documentation

## 2015-12-19 NOTE — Assessment & Plan Note (Addendum)
Occurring up to 3 times weekly , with prior non diagnostic 24 Hr Holter monitor done at [facility]. Referring ot [employer]

## 2015-12-19 NOTE — Assessment & Plan Note (Signed)
She has developed symptomatic bradycardia on Bystolic.  Changing to losartan 25 mg daily.

## 2015-12-19 NOTE — Assessment & Plan Note (Signed)

## 2015-12-19 NOTE — Addendum Note (Signed)
Addended by: Crecencio Mc on: 12/19/2015 10:47 PM   Modules accepted: Miquel Dunn

## 2015-12-31 ENCOUNTER — Encounter (HOSPITAL_COMMUNITY)
Admission: RE | Admit: 2015-12-31 | Discharge: 2015-12-31 | Disposition: A | Discharge status: Home / Self Care | Payer: Medicare Other | Source: Ambulatory Visit | Attending: Internal Medicine | Admitting: Internal Medicine

## 2015-12-31 DIAGNOSIS — M88852 Osteitis deformans of left thigh: Secondary | ICD-10-CM | POA: Diagnosis not present

## 2015-12-31 DIAGNOSIS — R102 Pelvic and perineal pain: Secondary | ICD-10-CM | POA: Diagnosis not present

## 2015-12-31 DIAGNOSIS — M25552 Pain in left hip: Secondary | ICD-10-CM | POA: Diagnosis not present

## 2015-12-31 MED ORDER — TECHNETIUM TC 99M MEDRONATE IV KIT
25.8000 | PACK | Freq: Once | INTRAVENOUS | Status: AC | PRN
  Administered 2015-12-31: 25.8 via INTRAVENOUS

## 2016-01-21 ENCOUNTER — Other Ambulatory Visit: Payer: Self-pay | Admitting: Internal Medicine

## 2016-02-13 ENCOUNTER — Ambulatory Visit (INDEPENDENT_AMBULATORY_CARE_PROVIDER_SITE_OTHER): Payer: Medicare Other | Admitting: Cardiovascular Disease

## 2016-02-13 ENCOUNTER — Encounter: Payer: Self-pay | Admitting: Cardiovascular Disease

## 2016-02-13 VITALS — BP 118/70 | HR 76 | Ht 63.0 in | Wt 105.2 lb

## 2016-02-13 DIAGNOSIS — R0609 Other forms of dyspnea: Secondary | ICD-10-CM | POA: Diagnosis not present

## 2016-02-13 DIAGNOSIS — I471 Supraventricular tachycardia: Secondary | ICD-10-CM | POA: Diagnosis not present

## 2016-02-13 DIAGNOSIS — R42 Dizziness and giddiness: Secondary | ICD-10-CM

## 2016-02-13 DIAGNOSIS — I773 Arterial fibromuscular dysplasia: Secondary | ICD-10-CM

## 2016-02-13 DIAGNOSIS — I7789 Other specified disorders of arteries and arterioles: Secondary | ICD-10-CM | POA: Diagnosis not present

## 2016-02-13 DIAGNOSIS — R0602 Shortness of breath: Secondary | ICD-10-CM

## 2016-02-13 DIAGNOSIS — R0600 Dyspnea, unspecified: Secondary | ICD-10-CM

## 2016-02-13 NOTE — Progress Notes (Signed)
Patient ID: Hailey Hunt, female    DOB: 1946-08-20, 70 y.o.   MRN: [id number]  HPI Comments: Hailey Hunt is a pleasant 70 year old woman with history of migraines, fibromuscular dysplasia noted in her carotids, renal arteries, Paget's disease, hypertension, long history of palpitations and tachycardia with medication intolerance of beta blockers, Graves' disease back in 1984, who presents by referral from Dr. Derrel Nip for symptoms of tachycardia, new symptoms of shortness of breath on exertion, dizziness.  She's tried several beta blockers for episodes of tachycardia These episodes typically last 15 seconds, 3 times per week, sometimes more, sometimes lasts She reports having tried atenolol in the past but had bradycardia with rates down into the 30s She did try a propranolol 5 mill grams twice a day for migraines and tachycardia. Heart rate was in the 40s and she stopped the medication Recently tried bystolic 2.5 mg daily, was relatively asymptomatic but noted heart rate 48 bpm and stopped the medication.  Typically very active at baseline but has noticed increasing shortness of breath when she climbs upstairs. Okay on a flat surface. She is concerned about episodes of dizziness that seem to happen all of the time, sometimes while sitting, sometimes with standing. Drinks plenty of fluids, denies regular orthostasis symptoms, seems to come and go.  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 in the  renal arteries  Review of lab work shows steady decline in her LDL from 130s down to 90 the past several years   Allergies  Allergen Reactions  . Voltaren [Diclofenac] Hypertension  . Adhesive [Tape]   . Demerol     HYPOTENSION  . Macrodantin   . Nsaids Hypertension  . Codeine Nausea And Vomiting and Rash  . Nickel Rash  . Penicillins Rash  . Sulfa Antibiotics Rash    Fever    Current Outpatient Prescriptions on File Prior to Visit  Medication Sig Dispense Refill  . cholecalciferol (VITAMIN D) 1000 UNITS tablet Take 1,000 Units by mouth daily.    . cyanocobalamin (,VITAMIN B-12,) 1000 MCG/ML injection INJECT ONE ML (1000MCG) IM EVERY MONTH 10 mL 3  . losartan (COZAAR) 25 MG tablet Take 1 tablet (25 mg total) by mouth daily. 30 tablet 5  . SYNTHROID 50 MCG tablet TAKE 1 TABLET EVERY DAY BEFORE BREAKFAST 90 tablet 0  . Syringe/Needle, Disp, (SYRINGE 3CC/27GX1-1/4") 27G X 1-1/4" 3 ML MISC For B12 injections 100 each 0  . traMADol (ULTRAM) 50 MG tablet Take 1 tablet (50 mg total) by mouth every 8 (eight) hours as needed. 30 tablet 3  . tretinoin (RETIN-A) 0.025 % cream Apply 1 application topically at bedtime. 45 g 6   No current facility-administered medications on file prior to visit.    Past Medical History  Diagnosis Date  . Kidney stone   . Hypertension   . Fibromuscular dysplasia of renal artery (HCC)     managed by Dr. Gregary Signs Nephrology  . Neuropathy with IgA monoclonal gammopathy (HCC)     Dr Mervyn Skeeters  . Dysplasia, artery, fibromuscular, renal ([place])   .  Urinary incontinence, mixed     resolved with removal of ained sutures in bladder   . Heart murmur   . Thyroid disease     Past Surgical History  Procedure Laterality Date  . Cataract extraction  Sep 10, 2011, Jan 13,2013  . Renal biopsy    . Vaginal delivery      x2  . Vaginal hysterectomy      Social History  reports that she has never smoked. She has never used smokeless tobacco. She reports that she  does not drink alcohol or use illicit drugs.  Family History family history includes Anuerysm in her maternal grandfather and mother; COPD in her mother; Cancer in her father, maternal aunt, and paternal grandmother; Diabetes in her sister; Drug abuse in her brother; Heart attack in her father; Heart disease in her brother, father, and mother; Heart failure in her mother; Hypertension in her mother.   Review of Systems  Constitutional: Negative.   HENT: Negative.   Respiratory: Positive for shortness of breath.   Cardiovascular: Negative.        Tachycardia  Gastrointestinal: Negative.   Musculoskeletal: Negative.   Neurological: Positive for light-headedness.  Hematological: Negative.   Psychiatric/Behavioral: Negative.   All other systems reviewed and are negative.   BP 118/70 mmHg  Pulse 76  Ht 5\' 3"  (1.6 m)  Wt 105 lb 4 oz (47.741 kg)  BMI 18.65 kg/m2  Physical Exam  Constitutional: She is oriented to person, place, and time. She appears well-developed and well-nourished.  HENT:  Head: Normocephalic.  Nose: Nose normal.  Mouth/Throat: Oropharynx is clear and moist.  Eyes: Conjunctivae are normal. Pupils are equal, round, and reactive to light.  Neck: Normal range of motion. Neck supple. No JVD present.  Cardiovascular: Normal rate, regular rhythm, normal heart sounds and intact distal pulses.  Exam reveals no gallop and no friction rub.   No murmur heard. Pulmonary/Chest: Effort normal and breath sounds normal. No respiratory distress. She has no wheezes. She has no rales. She exhibits no tenderness.  Abdominal: Soft. Bowel sounds are normal. She exhibits no distension. There is no tenderness.  Musculoskeletal: Normal range of motion. She exhibits no edema or tenderness.  Lymphadenopathy:    She has no cervical adenopathy.  Neurological: She is alert and oriented to person, place, and time. Coordination normal.  Skin: Skin is warm and dry. No rash noted. No erythema.   Psychiatric: She has a normal mood and affect. Her behavior is normal. Judgment and thought content normal.

## 2016-02-13 NOTE — Assessment & Plan Note (Signed)
Long history of exertional shortness of breath dating back several years, CT scans performed several years ago showing no significant disease. Long discussion concerning various treatment options. We have recommended echocardiogram to rule out structural heart disease, estimated right heart pressures She is also schedule for routine treadmill to evaluate exercise tolerance. She is low risk of ischemia, nonsmoker, no diabetes. She did have elevated cholesterol which has improved recently. Minimal carotid and aortic plaquing on recent CT  scanning

## 2016-02-13 NOTE — Assessment & Plan Note (Signed)
Etiology of her lightheadedness is unclear. Blood pressure stable today. Symptoms not consistent with orthostasis, sometimes occurring from a sitting position. Recommended she stay hydrated, if symptoms get worse, may need to stop the losartan.   Total encounter time more than 45 minutes  Greater than 50% was spent in counseling and coordination of care with the patient

## 2016-02-13 NOTE — Assessment & Plan Note (Signed)
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 She is concerned about FMD of the carotid arteries. This is not a very common finding. We have discussed various imaging modalities that could be used to evaluate her heart

## 2016-02-13 NOTE — Assessment & Plan Note (Signed)
Etiology of her tachycardia episodes is unclear Most likely atrial tachycardia though unable to exclude other arrhythmia We did offer 30 day monitor if symptoms get worse She has intolerance of beta blockers Potentially could use antiarrhythmic medications if symptoms do get worse and she is more symptomatic For now she is happy to monitor her symptoms without medication Long discussion concerning types of rhythms and potential options for treatment

## 2016-02-13 NOTE — Patient Instructions (Addendum)
No medication changes were made.  If tachycardia gets worse, Call the office We could order a 30 day monitor   We will order an echocardiogram for shortness of breath Echocardiography is a painless test that uses sound waves to create images of your heart. It provides your doctor with information about the size and shape of your heart and how well your heart's chambers and valves are working. This procedure takes approximately one hour. There are no restrictions for this procedure.  Date & time: _________________________  We will order a routine treadmill for shortness of breath, FMD  Date & time: ___________________________  Please call us if you have new issues that need to be addressed before your next appt.    Echocardiogram An echocardiogram, or echocardiography, uses sound waves (ultrasound) to produce an image of your heart. The echocardiogram is simple, painless, obtained within a short period of time, and offers valuable information to your health care provider. The images from an echocardiogram can provide information such as:  Evidence of coronary artery disease (CAD).  Heart size.  Heart muscle function.  Heart valve function.  Aneurysm detection.  Evidence of a past heart attack.  Fluid buildup around the heart.  Heart muscle thickening.  Assess heart valve function. LET [facility] PROVIDER KNOW ABOUT:  Any allergies you have.  All medicines you are taking, including vitamins, herbs, eye drops, creams, and over-the-counter medicines.  Previous problems you or members of your family have had with the use of anesthetics.  Any blood disorders you have.  Previous surgeries you have had.  Medical conditions you have.  Possibility of pregnancy, if this applies. BEFORE THE PROCEDURE  No special preparation is needed. Eat and drink normally.  PROCEDURE   In order to produce an image of your heart, gel will be applied to your chest and a wand-like  tool (transducer) will be moved over your chest. The gel will help transmit the sound waves from the transducer. The sound waves will harmlessly bounce off your heart to allow the heart images to be captured in real-time motion. These images will then be recorded.  You may need an IV to receive a medicine that improves the quality of the pictures. AFTER THE PROCEDURE You may return to your normal schedule including diet, activities, and medicines, unless your health care provider tells you otherwise.   This information is not intended to replace advice given to you by your health care provider. Make sure you discuss any questions you have with your health care provider.   Document Released: 09/12/2000 Document Revised: 10/06/2014 Document Reviewed: 05/23/2013 Elsevier Interactive Patient Education 2016 [employer] Exercise Stress Electrocardiogram An exercise stress electrocardiogram is a test that is done to evaluate the blood supply to your heart. This test may also be called exercise stress electrocardiography. The test is done while you are walking on a treadmill. The goal of this test is to raise your heart rate. This test is done to find areas of poor blood flow to the heart by determining the extent of coronary artery disease (CAD).   CAD is defined as narrowing in one or more heart (coronary) arteries of more than 70%. If you have an abnormal test result, this may mean that you are not getting adequate blood flow to your heart during exercise. Additional testing may be needed to understand why your test was abnormal. LET [facility] KNOW ABOUT:   Any allergies you have.  All medicines you are  taking, including vitamins, herbs, eye drops, creams, and over-the-counter medicines.  Previous problems you or members of your family have had with the use of anesthetics.  Any blood disorders you have.  Previous surgeries you have had.  Medical conditions you  have.  Possibility of pregnancy, if this applies. RISKS AND COMPLICATIONS Generally, this is a safe procedure. However, as with any procedure, complications can occur. Possible complications can include:  Pain or pressure in the following areas:  Chest.  Jaw or neck.  Between your shoulder blades.  Radiating down your left arm.  Dizziness or light-headedness.  Shortness of breath.  Increased or irregular heartbeats.  Nausea or vomiting.  Heart attack (rare). BEFORE THE PROCEDURE  Avoid all forms of caffeine 24 hours before your test or as directed by your health care provider. This includes coffee, tea (even decaffeinated tea), caffeinated sodas, chocolate, cocoa, and certain pain medicines.  Follow your health care provider's instructions regarding eating and drinking before the test.  Take your medicines as directed at regular times with water unless instructed otherwise. Exceptions may include:  If you have diabetes, ask how you are to take your insulin or pills. It is common to adjust insulin dosing the morning of the test.  If you are taking beta-blocker medicines, it is important to talk to your health care provider about these medicines well before the date of your test. Taking beta-blocker medicines may interfere with the test. In some cases, these medicines need to be changed or stopped 24 hours or more before the test.  If you wear a nitroglycerin patch, it may need to be removed prior to the test. Ask your health care provider if the patch should be removed before the test.  If you use an inhaler for any breathing condition, bring it with you to the test.  If you are an outpatient, bring a snack so you can eat right after the stress phase of the test.  Do not smoke for 4 hours prior to the test or as directed by your health care provider.  Do not apply lotions, powders, creams, or oils on your chest prior to the test.  Wear loose-fitting clothes and  comfortable shoes for the test. This test involves walking on a treadmill. PROCEDURE  Multiple patches (electrodes) will be put on your chest. If needed, small areas of your chest may have to be shaved to get better contact with the electrodes. Once the electrodes are attached to your body, multiple wires will be attached to the electrodes and your heart rate will be monitored.  Your heart will be monitored both at rest and while exercising.  You will walk on a treadmill. The treadmill will be started at a slow pace. The treadmill speed and incline will gradually be increased to raise your heart rate. AFTER THE PROCEDURE  Your heart rate and blood pressure will be monitored after the test.  You may return to your normal schedule including diet, activities, and medicines, unless your health care provider tells you otherwise.   This information is not intended to replace advice given to you by your health care provider. Make sure you discuss any questions you have with your health care provider.   Document Released: 09/12/2000 Document Revised: 09/20/2013 Document Reviewed: 05/23/2013 Elsevier Interactive Patient Education [employer].

## 2016-02-18 ENCOUNTER — Ambulatory Visit (INDEPENDENT_AMBULATORY_CARE_PROVIDER_SITE_OTHER): Payer: Medicare Other

## 2016-02-18 DIAGNOSIS — R0602 Shortness of breath: Secondary | ICD-10-CM | POA: Diagnosis not present

## 2016-02-18 DIAGNOSIS — I471 Supraventricular tachycardia: Secondary | ICD-10-CM | POA: Diagnosis not present

## 2016-02-20 LAB — EXERCISE TOLERANCE TEST
Estimated workload: 10.1 METS
Exercise duration (min): 9 min
Exercise duration (sec): 0 s
MPHR: 153 {beats}/min
Peak BP: 202 mmHg
Peak HR: 151 {beats}/min
Percent HR: 100 %
Percent of predicted max HR: 98 %
RPE: 16
Rest HR: 71 {beats}/min
Stage 1 DBP: 74 mmHg
Stage 1 Grade: 0 %
Stage 1 HR: 81 {beats}/min
Stage 1 SBP: 125 mmHg
Stage 1 Speed: 0 mph
Stage 10 Grade: 0 %
Stage 10 HR: 88 {beats}/min
Stage 10 Speed: 0 mph
Stage 2 Grade: 0 %
Stage 2 HR: 82 {beats}/min
Stage 2 Speed: 0 mph
Stage 3 Grade: 0 %
Stage 3 HR: 82 {beats}/min
Stage 3 Speed: 0 mph
Stage 4 Grade: 0 %
Stage 4 HR: 86 {beats}/min
Stage 4 Speed: 1 mph
Stage 5 Grade: 0.1 %
Stage 5 HR: 86 {beats}/min
Stage 5 Speed: 1 mph
Stage 6 DBP: 63 mmHg
Stage 6 Grade: 10 %
Stage 6 HR: 133 {beats}/min
Stage 6 SBP: 173 mmHg
Stage 6 Speed: 1.7 mph
Stage 7 DBP: 62 mmHg
Stage 7 Grade: 12 %
Stage 7 HR: 150 {beats}/min
Stage 7 SBP: 194 mmHg
Stage 7 Speed: 2.5 mph
Stage 8 DBP: 56 mmHg
Stage 8 Grade: 14 %
Stage 8 HR: 151 {beats}/min
Stage 8 SBP: 202 mmHg
Stage 8 Speed: 3.4 mph
Stage 9 DBP: 52 mmHg
Stage 9 Grade: 0 %
Stage 9 HR: 136 {beats}/min
Stage 9 SBP: 194 mmHg
Stage 9 Speed: 0 mph

## 2016-02-29 ENCOUNTER — Ambulatory Visit (INDEPENDENT_AMBULATORY_CARE_PROVIDER_SITE_OTHER): Payer: Medicare Other

## 2016-02-29 ENCOUNTER — Other Ambulatory Visit: Payer: Self-pay

## 2016-02-29 DIAGNOSIS — R0602 Shortness of breath: Secondary | ICD-10-CM | POA: Diagnosis not present

## 2016-02-29 DIAGNOSIS — I471 Supraventricular tachycardia: Secondary | ICD-10-CM

## 2016-03-05 ENCOUNTER — Encounter: Payer: Self-pay | Admitting: Cardiovascular Disease

## 2016-03-06 DIAGNOSIS — Z1231 Encounter for screening mammogram for malignant neoplasm of breast: Secondary | ICD-10-CM | POA: Diagnosis not present

## 2016-03-06 LAB — HM MAMMOGRAPHY

## 2016-03-19 ENCOUNTER — Encounter: Payer: Self-pay | Admitting: Internal Medicine

## 2016-03-19 ENCOUNTER — Ambulatory Visit (INDEPENDENT_AMBULATORY_CARE_PROVIDER_SITE_OTHER): Payer: Medicare Other | Admitting: Internal Medicine

## 2016-03-19 ENCOUNTER — Telehealth: Payer: Self-pay | Admitting: Internal Medicine

## 2016-03-19 VITALS — BP 102/60 | HR 75 | Temp 98.1°F | Resp 12 | Ht 63.0 in | Wt 105.2 lb

## 2016-03-19 DIAGNOSIS — M88852 Osteitis deformans of left thigh: Secondary | ICD-10-CM

## 2016-03-19 DIAGNOSIS — Z1211 Encounter for screening for malignant neoplasm of colon: Secondary | ICD-10-CM

## 2016-03-19 DIAGNOSIS — E890 Postprocedural hypothyroidism: Secondary | ICD-10-CM

## 2016-03-19 DIAGNOSIS — R0609 Other forms of dyspnea: Secondary | ICD-10-CM

## 2016-03-19 DIAGNOSIS — R0600 Dyspnea, unspecified: Secondary | ICD-10-CM

## 2016-03-19 DIAGNOSIS — I471 Supraventricular tachycardia: Secondary | ICD-10-CM

## 2016-03-19 NOTE — Patient Instructions (Signed)
Referral to Dr Zenovia Jarred for your colonoscopy is under way  See you in 6 months

## 2016-03-19 NOTE — Progress Notes (Signed)
Prior authorization for synthroid started on cover my meds.

## 2016-03-19 NOTE — Progress Notes (Signed)
Pre-visit discussion using our clinic review tool. No additional management support is needed unless otherwise documented below in the visit note.  

## 2016-03-19 NOTE — Progress Notes (Signed)
Subjective:  Patient ID: BERLIN JERUE, female    DOB: [birth year]  Age: 70 y.o. MRN: [id number]  CC: The primary encounter diagnosis was Colon cancer screening. 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  HPI Hailey Hunt presents for follow up on initiation of losartan  For hypertension , and cardiology eval for SVT   BP has been normal on losartan 25 mg .  Has had one low reading of 99991111 systolic and she felt dizzy.  Normal has been 90/60 .  chronic insomnia,  has been  no worse than usual .   Normal ECHO Normal stress test DEXA normal 2013 . Paget's disease left iliac.   mammogram normal [facility] June 8  Annual Bone Scan due to Paget's Disease was done in April   Needs colonoscopy referral , last one was in 2007 by  Zenovia Jarred .  No issues      Outpatient Prescriptions Prior to Visit  Medication Sig Dispense Refill  . cholecalciferol (VITAMIN D) 1000 UNITS tablet Take 1,000 Units by mouth daily.    . cyanocobalamin (,VITAMIN B-12,) 1000 MCG/ML injection INJECT ONE ML (1000MCG) IM EVERY MONTH 10 mL 3  . losartan (COZAAR) 25 MG tablet Take 1 tablet (25 mg total) by mouth daily. 30 tablet 5  . SYNTHROID 50 MCG tablet TAKE 1 TABLET EVERY DAY BEFORE BREAKFAST 90 tablet 0  . Syringe/Needle, Disp, (SYRINGE 3CC/27GX1-1/4") 27G X 1-1/4" 3 ML MISC For B12 injections 100 each 0  . traMADol (ULTRAM) 50 MG tablet Take 1 tablet (50 mg total) by mouth every 8 (eight) hours as needed. 30 tablet 3  . tretinoin (RETIN-A) 0.025 % cream Apply 1 application topically at bedtime. 45 g 6   No facility-administered medications prior to visit.    Review of Systems;  Patient denies headache, fevers, malaise, unintentional weight loss, skin rash, eye pain, sinus congestion and sinus pain, sore throat, dysphagia,   hemoptysis , cough, dyspnea, wheezing, chest pain, palpitations, orthopnea, edema, abdominal pain, nausea, melena, diarrhea, constipation, flank pain, dysuria, hematuria, urinary  Frequency, nocturia, numbness, tingling, seizures,  Focal weakness, Loss of consciousness,  Tremor,  depression, anxiety, and suicidal ideation.      Objective:  BP 102/60 mmHg  Pulse 75  Temp(Src) 98.1 F (36.7 C) (Oral)  Resp 12  Ht 5\' 3"  (1.6 m)  Wt 105 lb 4 oz (47.741 kg)  BMI 18.65 kg/m2  SpO2 99%  BP Readings from Last 3 Encounters:  03/19/16 102/60  02/13/16 118/70  12/18/15 104/62    Wt Readings from Last 3 Encounters:  03/19/16 105 lb 4 oz (47.741 kg)  02/13/16 105 lb 4 oz (47.741 kg)  12/18/15 105 lb 4 oz (47.741 kg)    General appearance: alert, cooperative and appears stated age Ears: normal TM's and external ear canals both ears Throat: lips, mucosa, and tongue normal; teeth and gums normal Neck: no adenopathy, no carotid bruit, supple, symmetrical, trachea midline and thyroid not enlarged, symmetric, no tenderness/mass/nodules Back: symmetric, no curvature. ROM normal. No CVA tenderness. Lungs: clear to auscultation bilaterally Heart: regular rate and rhythm, S1, S2 normal, no murmur, click, rub or gallop Abdomen: soft, non-tender; bowel sounds normal; no masses,  no organomegaly Pulses: 2+ and symmetric Skin: Skin color, texture, turgor normal. No rashes or lesions Lymph nodes: Cervical, supraclavicular, and axillary nodes normal.  No  results found for: HGBA1C  Lab Results  Component Value Date   CREATININE 0.95 11/19/2015   CREATININE 1.00 03/28/2015   CREATININE 0.9 08/14/2014    Lab Results  Component Value Date   WBC 6.6 08/29/2013   HGB 12.4 08/29/2013   HCT 37.3 08/29/2013   PLT 308.0 08/29/2013   GLUCOSE 82 11/19/2015   CHOL 209* 08/14/2014   TRIG 55.0 08/14/2014   HDL 71.90 08/14/2014   LDLDIRECT 92.0 11/19/2015   LDLCALC 126* 08/14/2014   ALT 12 11/19/2015     AST 19 11/19/2015   NA 142 11/19/2015   K 4.1 11/19/2015   CL 106 11/19/2015   CREATININE 0.95 11/19/2015   BUN 13 11/19/2015   CO2 27 11/19/2015   TSH 0.85 11/19/2015   MICROALBUR 0.2 09/07/2013    Nm Bone Scan Whole Body  12/31/2015  CLINICAL DATA:  Left hip and pelvic pain EXAM: NUCLEAR MEDICINE WHOLE BODY BONE SCAN TECHNIQUE: Whole body anterior and posterior images were obtained approximately 3 hours after intravenous injection of radiopharmaceutical. RADIOPHARMACEUTICALS:  25.8 mCi Technetium-73m MDP IV COMPARISON:  None FINDINGS: There is asymmetric increased uptake throughout the left iliac bone. The appearance and distribution is compatible with Paget's disease. Increased uptake in the distribution of the nasal bone is also noted, nonspecific. This could be seen with Paget's disease, fibrous dysplasia or chronic sinusitis. No findings identified to suggest bone metastases. Physiologic uptake is noted within the kidneys and urinary bladder. IMPRESSION: 1. Increased uptake within the left iliac bone compatible with Paget's disease. 2. Central area of increased uptake in the region of the nasal bone which may also reflect Paget's disease of the skull. Electronically Signed   By: Kerby Moors M.D.   On: 12/31/2015 14:43    Assessment & Plan:   Problem List Items Addressed This Visit    Hypothyroidism following radioiodine therapy    Postablative, Managed with name brand only synthroid.  Thyroid function is WNL on current dose.  No current changes needed.   Lab Results  Component Value Date   TSH 0.85 11/19/2015         Exertional dyspnea    Recent Noninvasive Cardiac workup has been unrevealing of cardiomyopathy or significant  CAD.       Paget's disease of bone in pelvic region or thigh    No progression by recent nuc med bone scan       PSVT (paroxysmal supraventricular tachycardia) [facility])    Cardiology evaluation with ECHO and stress test were unremarkable.  She has  been intolerant of hosrt acting beta blockers  and the episodes last < 1 minute,  No further workup.        Other Visit Diagnoses    Colon cancer screening    -  Primary    Relevant Orders    Ambulatory referral to Gastroenterology      A total of 25 minutes of face to face time was spent with patient more than half of which was spent in counselling about the above mentioned conditions  and coordination of care  I am having Ms. Vonruden maintain her SYRINGE 3CC/27GX1-1/4", cholecalciferol, cyanocobalamin, traMADol, tretinoin, losartan, and SYNTHROID.  No orders of the defined types were placed in this encounter.    There are no discontinued medications.  Follow-up: Return in about 6 months (around 09/18/2016).   Crecencio Mc, MD

## 2016-03-19 NOTE — Telephone Encounter (Signed)
ok 

## 2016-03-22 NOTE — Assessment & Plan Note (Addendum)
Postablative, Managed with name brand only synthroid.  Thyroid function is WNL on current dose.  No current changes needed.   Lab Results  Component Value Date   TSH 0.85 11/19/2015

## 2016-03-22 NOTE — Assessment & Plan Note (Signed)
Cardiology evaluation with ECHO and stress test were unremarkable.  She has been intolerant of hosrt acting beta blockers  and the episodes last < 1 minute,  No further workup.

## 2016-03-22 NOTE — Assessment & Plan Note (Signed)
No progression by recent nuc med bone scan

## 2016-03-22 NOTE — Assessment & Plan Note (Signed)
Recent Noninvasive Cardiac workup has been unrevealing of cardiomyopathy or significant  CAD.

## 2016-03-24 ENCOUNTER — Encounter: Payer: Self-pay | Admitting: Internal Medicine

## 2016-03-24 NOTE — Telephone Encounter (Signed)
Pt is scheduled with Dr. Hilarie Fredrickson for 05-26-16 for a colonoscopy

## 2016-04-16 ENCOUNTER — Other Ambulatory Visit: Payer: Self-pay | Admitting: Internal Medicine

## 2016-04-28 ENCOUNTER — Other Ambulatory Visit: Payer: Self-pay | Admitting: Internal Medicine

## 2016-05-13 ENCOUNTER — Ambulatory Visit (AMBULATORY_SURGERY_CENTER): Payer: Self-pay | Admitting: *Deleted

## 2016-05-13 VITALS — Ht 63.0 in | Wt 104.0 lb

## 2016-05-13 DIAGNOSIS — Z1211 Encounter for screening for malignant neoplasm of colon: Secondary | ICD-10-CM

## 2016-05-13 MED ORDER — NA SULFATE-K SULFATE-MG SULF 17.5-3.13-1.6 GM/177ML PO SOLN: 1.0000 | Freq: Once | ORAL | 0 refills | Status: AC

## 2016-05-13 NOTE — Progress Notes (Signed)
No egg or soy allergy. No anesthesia problems.  No home O2.  No diet meds.  

## 2016-05-14 ENCOUNTER — Telehealth: Payer: Self-pay | Admitting: Internal Medicine

## 2016-05-14 NOTE — Telephone Encounter (Signed)
Called pt. The pay no more than 50$ works with her insurance- she states she was not givent his yesterday- called [employer]- gave the information the the pharmacist over the phone from the coupon  [place] PCN [id number] GRP  [id number] ID [id number]  Pt aware will prob be a 50$ co pay   Lelan Pons PV

## 2016-05-26 ENCOUNTER — Encounter: Payer: Self-pay | Admitting: Internal Medicine

## 2016-05-26 ENCOUNTER — Ambulatory Visit (AMBULATORY_SURGERY_CENTER): Payer: Medicare Other | Admitting: Internal Medicine

## 2016-05-26 VITALS — BP 137/59 | HR 68 | Temp 98.7°F | Resp 11 | Ht 63.0 in | Wt 104.0 lb

## 2016-05-26 DIAGNOSIS — Z1211 Encounter for screening for malignant neoplasm of colon: Secondary | ICD-10-CM

## 2016-05-26 DIAGNOSIS — K635 Polyp of colon: Secondary | ICD-10-CM

## 2016-05-26 DIAGNOSIS — I10 Essential (primary) hypertension: Secondary | ICD-10-CM | POA: Diagnosis not present

## 2016-05-26 DIAGNOSIS — D124 Benign neoplasm of descending colon: Secondary | ICD-10-CM

## 2016-05-26 MED ORDER — SODIUM CHLORIDE 0.9 % IV SOLN: 500.0000 mL | INTRAVENOUS | Status: DC

## 2016-05-26 NOTE — Op Note (Signed)
[place] Patient Name: Hailey Hunt Procedure Date: 05/26/2016 10:40 AM MRN: [id number] Endoscopist: Jerene Bears , MD Age: 70 Referring MD:  Date of Birth: 1946/04/19 Gender: Female Account #: [id number] Procedure:                Colonoscopy Indications:              Screening for colorectal malignant neoplasm, Last                            colonoscopy 10 years ago Medicines:                Monitored Anesthesia Care Procedure:                Pre-Anesthesia Assessment:                           - Prior to the procedure, a History and Physical                            was performed, and patient medications and                            allergies were reviewed. The patient's tolerance of                            previous anesthesia was also reviewed. The risks                            and benefits of the procedure and the sedation                            options and risks were discussed with the patient.                            All questions were answered, and informed consent                            was obtained. Prior Anticoagulants: The patient has                            taken no previous anticoagulant or antiplatelet                            agents. ASA Grade Assessment: II - A patient with                            mild systemic disease. After reviewing the risks                            and benefits, the patient was deemed in                            satisfactory condition to undergo the procedure.  After obtaining informed consent, the colonoscope                            was passed under direct vision. Throughout the                            procedure, the patient's blood pressure, pulse, and                            oxygen saturations were monitored continuously. The                            Model PCF-H190L ([id number]) scope was introduced                            through the anus and advanced to  the the terminal                            ileum. The colonoscopy was somewhat difficult due                            to multiple diverticula in the colon. Successful                            completion of the procedure was aided by changing                            the patient to a supine position. The patient                            tolerated the procedure well. The quality of the                            bowel preparation was excellent. The terminal                            ileum, ileocecal valve, appendiceal orifice, and                            rectum were photographed. Scope In: 10:46:53 AM Scope Out: 11:04:26 AM Scope Withdrawal Time: 0 hours 10 minutes 12 seconds  Total Procedure Duration: 0 hours 17 minutes 33 seconds  Findings:                 The perianal and digital rectal examinations were                            normal.                           The terminal ileum appeared normal.                           A 5 mm polyp was found in the proximal descending  colon. The polyp was sessile. The polyp was removed                            with a cold snare. Resection and retrieval were                            complete.                           Multiple medium-mouthed diverticula were found in                            the recto-sigmoid colon and sigmoid colon.                           Internal hemorrhoids were found during                            retroflexion. The hemorrhoids were small. Complications:            No immediate complications. Estimated Blood Loss:     Estimated blood loss was minimal. Impression:               - The examined portion of the ileum was normal.                           - One 5 mm polyp in the proximal descending colon,                            removed with a cold snare. Resected and retrieved.                           - Moderate diverticulosis in the recto-sigmoid                             colon and in the sigmoid colon.                           - Small internal hemorrhoids. Recommendation:           - Patient has a contact number available for                            emergencies. The signs and symptoms of potential                            delayed complications were discussed with the                            patient. Return to normal activities tomorrow.                            Written discharge instructions were provided to the                            patient.                           -  Resume previous diet.                           - Continue present medications.                           - Await pathology results.                           - Repeat colonoscopy is recommended. The                            colonoscopy date will be determined after pathology                            results from today's exam become available for                            review. Jerene Bears, MD 05/26/2016 11:08:53 AM This report has been signed electronically.

## 2016-05-26 NOTE — Patient Instructions (Signed)
YOU HAD AN ENDOSCOPIC PROCEDURE TODAY AT [facility]:   Refer to the procedure report that was given to you for any specific questions about what was found during the examination.  If the procedure report does not answer your questions, please call your gastroenterologist to clarify.  If you requested that your care partner not be given the details of your procedure findings, then the procedure report has been included in a sealed envelope for you to review at your convenience later.  YOU SHOULD EXPECT: Some feelings of bloating in the abdomen. Passage of more gas than usual.  Walking can help get rid of the air that was put into your GI tract during the procedure and reduce the bloating. If you had a lower endoscopy (such as a colonoscopy or flexible sigmoidoscopy) you may notice spotting of blood in your stool or on the toilet paper. If you underwent a bowel prep for your procedure, you may not have a normal bowel movement for a few days.  Please Note:  You might notice some irritation and congestion in your nose or some drainage.  This is from the oxygen used during your procedure.  There is no need for concern and it should clear up in a day or so.  SYMPTOMS TO REPORT IMMEDIATELY:   Following lower endoscopy (colonoscopy or flexible sigmoidoscopy):  Excessive amounts of blood in the stool  Significant tenderness or worsening of abdominal pains  Swelling of the abdomen that is new, acute  Fever of 100F or higher   For urgent or emergent issues, a gastroenterologist can be reached at any hour by calling [phone].   DIET:  We do recommend a small meal at first, but then you may proceed to your regular diet.  Drink plenty of fluids but you should avoid alcoholic beverages for 24 hours.  ACTIVITY:  You should plan to take it easy for the rest of today and you should NOT DRIVE or use heavy machinery until tomorrow (because of the sedation medicines used during the test).     FOLLOW UP: Our staff will call the number listed on your records the next business day following your procedure to check on you and address any questions or concerns that you may have regarding the information given to you following your procedure. If we do not reach you, we will leave a message.  However, if you are feeling well and you are not experiencing any problems, there is no need to return our call.  We will assume that you have returned to your regular daily activities without incident.  If any biopsies were taken you will be contacted by phone or by letter within the next 1-3 weeks.  Please call us at [phone] if you have not heard about the biopsies in 3 weeks.    SIGNATURES/CONFIDENTIALITY: You and/or your care partner have signed paperwork which will be entered into your electronic medical record.  These signatures attest to the fact that that the information above on your After Visit Summary has been reviewed and is understood.  Full responsibility of the confidentiality of this discharge information lies with you and/or your care-partner.  Polyps, Diverticulosis, hemorrhoids, high fiber diet handouts provided. Continue current medications.

## 2016-05-26 NOTE — Progress Notes (Signed)
Report to PACU, RN, vss, BBS= Clear.  

## 2016-05-26 NOTE — Progress Notes (Signed)
Called to room to assist during endoscopic procedure.  Patient ID and intended procedure confirmed with present staff. Received instructions for my participation in the procedure from the performing physician.  

## 2016-05-27 ENCOUNTER — Telehealth: Payer: Self-pay | Admitting: *Deleted

## 2016-05-27 NOTE — Telephone Encounter (Signed)
  Follow up Call-  Call back number 05/26/2016  Post procedure Call Back phone  # [phone]  Permission to leave phone message Yes  Some recent data might be hidden     Patient questions:  Do you have a fever, pain , or abdominal swelling? No. Pain Score  0 *  Have you tolerated food without any problems? Yes.    Have you been able to return to your normal activities? Yes.    Do you have any questions about your discharge instructions: Diet   No. Medications  No. Follow up visit  No.  Do you have questions or concerns about your Care? No.  Actions: * If pain score is 4 or above: No action needed, pain <4.

## 2016-05-30 ENCOUNTER — Encounter: Payer: Self-pay | Admitting: Internal Medicine

## 2016-06-04 ENCOUNTER — Other Ambulatory Visit: Payer: Self-pay | Admitting: Internal Medicine

## 2016-07-11 DIAGNOSIS — I701 Atherosclerosis of renal artery: Secondary | ICD-10-CM | POA: Diagnosis not present

## 2016-07-11 DIAGNOSIS — I773 Arterial fibromuscular dysplasia: Secondary | ICD-10-CM | POA: Diagnosis not present

## 2016-07-11 DIAGNOSIS — I6529 Occlusion and stenosis of unspecified carotid artery: Secondary | ICD-10-CM | POA: Diagnosis not present

## 2016-07-14 DIAGNOSIS — Z23 Encounter for immunization: Secondary | ICD-10-CM | POA: Diagnosis not present

## 2016-09-15 ENCOUNTER — Other Ambulatory Visit: Payer: Self-pay | Admitting: Internal Medicine

## 2016-09-18 ENCOUNTER — Encounter: Payer: Self-pay | Admitting: Internal Medicine

## 2016-09-18 ENCOUNTER — Ambulatory Visit (INDEPENDENT_AMBULATORY_CARE_PROVIDER_SITE_OTHER): Payer: Medicare Other | Admitting: Internal Medicine

## 2016-09-18 VITALS — BP 112/68 | HR 77 | Temp 97.5°F | Resp 16 | Ht 63.0 in | Wt 99.8 lb

## 2016-09-18 DIAGNOSIS — R634 Abnormal weight loss: Secondary | ICD-10-CM

## 2016-09-18 DIAGNOSIS — E162 Hypoglycemia, unspecified: Secondary | ICD-10-CM

## 2016-09-18 DIAGNOSIS — R7303 Prediabetes: Secondary | ICD-10-CM

## 2016-09-18 DIAGNOSIS — R911 Solitary pulmonary nodule: Secondary | ICD-10-CM | POA: Diagnosis not present

## 2016-09-18 DIAGNOSIS — R252 Cramp and spasm: Secondary | ICD-10-CM | POA: Diagnosis not present

## 2016-09-18 DIAGNOSIS — E890 Postprocedural hypothyroidism: Secondary | ICD-10-CM

## 2016-09-18 LAB — HEMOGLOBIN A1C: Hgb A1c MFr Bld: 5.8 % (ref 4.6–6.5)

## 2016-09-18 LAB — COMPREHENSIVE METABOLIC PANEL
ALT: 16 U/L (ref 0–35)
AST: 21 U/L (ref 0–37)
Albumin: 4.6 g/dL (ref 3.5–5.2)
Alkaline Phosphatase: 87 U/L (ref 39–117)
BUN: 19 mg/dL (ref 6–23)
CO2: 28 mEq/L (ref 19–32)
Calcium: 9.8 mg/dL (ref 8.4–10.5)
Chloride: 105 mEq/L (ref 96–112)
Creatinine, Ser: 0.96 mg/dL (ref 0.40–1.20)
GFR: 61.41 mL/min (ref >60.00)
Glucose, Bld: 93 mg/dL (ref 70–99)
Potassium: 4.1 mEq/L (ref 3.5–5.1)
Sodium: 141 mEq/L (ref 135–145)
Total Bilirubin: 0.7 mg/dL (ref 0.2–1.2)
Total Protein: 6.7 g/dL (ref 6.0–8.3)

## 2016-09-18 LAB — CK: Total CK: 87 U/L (ref 7–177)

## 2016-09-18 NOTE — Patient Instructions (Signed)
  You might want to try a premixed protein drink called Premier Protein shake for breakfast or late night snack . It is great tasting,   very low sugar and available of < $2 serving at [employer] and  In bulk for $1.50/serving at [employer] and [employer]  .    Nutritional analysis :  160 cal  30 g protein  1 g sugar 50% calcium needs   [employer] and [employer]   

## 2016-09-18 NOTE — Progress Notes (Signed)
Subjective:  Patient ID: Hailey Hunt, female    DOB: 1946/07/07  Age: 70 y.o. MRN: [id number]  CC: The primary encounter diagnosis was Loss of weight. Diagnoses of Lung nodule seen on imaging study, Hypoglycemia, Muscle cramps, Solitary pulmonary nodule, Cramps, muscle, general, Hypothyroidism following radioiodine therapy, and Prediabetes were also pertinent to this visit.  HPI DIETRA DIFFEY presents for follow up on multiple issues:   1) Losing weight  5 lbs since august.  Top weight 115 in 2013.  Appetite ok   2) Lung lesion  Seen at [facility] first seen on  CT in 2016  3) FMD sees [facility]  bp has been fine     4) Recurrent hypoglycemia,  Managed with frequent snacks that include carbs.  wants an a1c checked . 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  5)  Recurrent muscle cramps In her thighs and hands , Occurring day and night    Taking 1000 Ius D3 daily   Lab Results  Component Value Date   VITAMINB12 181 (L) 08/29/2013   6) b12 deficiency:  She is taking b12 every other month and using sublingual daily   7) halth maintenance Had colonoscopy in august   8) foot pain:Was painting her husband's  office and fell while jumping off a scaffold.  Foot got caught on a ledge and she was thrown into a corner. Thinks that  Bot middle  Toes were fracture because entire forefoot became ecchymotic,   Now resolved.     Outpatient Medications Prior to Visit  Medication Sig Dispense Refill  . cholecalciferol (VITAMIN D) 1000 UNITS tablet Take 1,000 Units by mouth daily.    . cyanocobalamin (,VITAMIN B-12,) 1000 MCG/ML injection INJECT ONE ML (1000MCG) IM EVERY MONTH 10 mL 3  . Cyanocobalamin (VITAMIN B 12 PO) Take by mouth.    . losartan (COZAAR) 25 MG tablet TAKE 1 TABLET EVERY DAY 30 tablet 2  . SYNTHROID 50 MCG tablet TAKE 1 TABLET EVERY DAY BEFORE BREAKFAST 90 tablet 3  . Syringe/Needle, Disp, (SYRINGE 3CC/27GX1-1/4") 27G X 1-1/4" 3 ML MISC For B12 injections  100 each 0  . traMADol (ULTRAM) 50 MG tablet Take 1 tablet (50 mg total) by mouth every 8 (eight) hours as needed. 30 tablet 3  . tretinoin (RETIN-A) 0.025 % cream Apply 1 application topically at bedtime. 45 g 6   Facility-Administered Medications Prior to Visit  Medication Dose Route Frequency Provider Last Rate Last Dose  . 0.9 %  sodium chloride infusion  500 mL Intravenous Continuous Jerene Bears, MD        Review of Systems;  Patient denies headache, fevers, malaise, unintentional weight loss, skin rash, eye pain, sinus congestion and sinus pain, sore throat, dysphagia,  hemoptysis , cough, dyspnea, wheezing, chest pain, palpitations, orthopnea, edema, abdominal pain, nausea, melena, diarrhea, constipation, flank pain, dysuria, hematuria, urinary  Frequency, nocturia, numbness, tingling, seizures,  Focal weakness, Loss of consciousness,  Tremor, insomnia, depression, anxiety, and suicidal ideation.      Objective:  BP 112/68   Pulse 77   Temp 97.5 F (36.4 C) (Oral)   Resp 16   Ht 5\' 3"  (1.6 m)   Wt 99 lb 12 oz (45.2 kg)   SpO2 97%   BMI 17.67 kg/m   BP Readings from Last 3 Encounters:  09/18/16 112/68  05/26/16 (!) 137/59  03/19/16 102/60    Wt Readings from Last 3 Encounters:  09/18/16 99 lb 12  oz (45.2 kg)  05/26/16 104 lb (47.2 kg)  05/13/16 104 lb (47.2 kg)    General appearance: alert, cooperative and appears stated age Ears: normal TM's and external ear canals both ears Throat: lips, mucosa, and tongue normal; teeth and gums normal Neck: no adenopathy, no carotid bruit, supple, symmetrical, trachea midline and thyroid not enlarged, symmetric, no tenderness/mass/nodules Back: symmetric, no curvature. ROM normal. No CVA tenderness. Lungs: clear to auscultation bilaterally Heart: regular rate and rhythm, S1, S2 normal, no murmur, click, rub or gallop Abdomen: soft, non-tender; bowel sounds normal; no masses,  no organomegaly Pulses: 2+ and symmetric Skin:  Skin color, texture, turgor normal. No rashes or lesions Lymph nodes: Cervical, supraclavicular, and axillary nodes normal.  Lab Results  Component Value Date   HGBA1C 5.8 09/18/2016    Lab Results  Component Value Date   CREATININE 0.96 09/18/2016   CREATININE 0.95 11/19/2015   CREATININE 1.00 03/28/2015    Lab Results  Component Value Date   WBC 6.6 08/29/2013   HGB 12.4 08/29/2013   HCT 37.3 08/29/2013   PLT 308.0 08/29/2013   GLUCOSE 93 09/18/2016   CHOL 209 (H) 08/14/2014   TRIG 55.0 08/14/2014   HDL 71.90 08/14/2014   LDLDIRECT 92.0 11/19/2015   LDLCALC 126 (H) 08/14/2014   ALT 16 09/18/2016   AST 21 09/18/2016   NA 141 09/18/2016   K 4.1 09/18/2016   CL 105 09/18/2016   CREATININE 0.96 09/18/2016   BUN 19 09/18/2016   CO2 28 09/18/2016   TSH 0.851 09/18/2016   HGBA1C 5.8 09/18/2016   MICROALBUR 0.2 09/07/2013    Nm Bone Scan Whole Body  Result Date: 12/31/2015 CLINICAL DATA:  Left hip and pelvic pain EXAM: NUCLEAR MEDICINE WHOLE BODY BONE SCAN TECHNIQUE: Whole body anterior and posterior images were obtained approximately 3 hours after intravenous injection of radiopharmaceutical. RADIOPHARMACEUTICALS:  25.8 mCi Technetium-10m MDP IV COMPARISON:  None FINDINGS: There is asymmetric increased uptake throughout the left iliac bone. The appearance and distribution is compatible with Paget's disease. Increased uptake in the distribution of the nasal bone is also noted, nonspecific. This could be seen with Paget's disease, fibrous dysplasia or chronic sinusitis. No findings identified to suggest bone metastases. Physiologic uptake is noted within the kidneys and urinary bladder. IMPRESSION: 1. Increased uptake within the left iliac bone compatible with Paget's disease. 2. Central area of increased uptake in the region of the nasal bone which may also reflect Paget's disease of the skull. Electronically Signed   By: Kerby Moors M.D.   On: 12/31/2015 14:43    Assessment  & Plan:   Problem List Items Addressed This Visit    Cramps, muscle, general    Etiology unclear.  Thyroid, calcium (ionized) electrolytes and ck are all normal       Hypothyroidism following radioiodine therapy    Thyroid function is WNL on current dose.  No current changes needed.   Lab Results  Component Value Date   TSH 0.851 09/18/2016         Loss of weight - Primary    Etiology unclear,  Thyroid function is normal,  Protein stores normal,  Screening up to date including colonoscopy this summer and mammogram . Will recommend increasing the protein and fat in her diet.  Diabetes ruled out,  But a1c is "prediabetic" range at 5.8      Relevant Orders   Comprehensive metabolic panel (Completed)   T4 AND TSH (Completed)   Hemoglobin A1c (Completed)  Prediabetes    Suggested by a1c,  With recurrent hypoglycemia during fasting states. Recommend high protein low carb diet.       Solitary pulmonary nodule    She is overdue for annual follow up by CT        Other Visit Diagnoses    Lung nodule seen on imaging study       Relevant Orders   CT Chest Wo Contrast   Hypoglycemia       Relevant Orders   Hemoglobin A1c (Completed)   Muscle cramps       Relevant Orders   Calcium, ionized (Completed)   CK (Creatine Kinase) (Completed)      I am having Ms. Chubbuck maintain her SYRINGE 3CC/27GX1-1/4", cholecalciferol, cyanocobalamin, traMADol, tretinoin, SYNTHROID, Cyanocobalamin (VITAMIN B 12 PO), and losartan. We will continue to administer sodium chloride.  No orders of the defined types were placed in this encounter.   There are no discontinued medications.  Follow-up: No Follow-up on file.   Crecencio Mc, MD

## 2016-09-19 LAB — CALCIUM, IONIZED: Calcium, Ion: 5.2 mg/dL (ref 4.8–5.6)

## 2016-09-19 LAB — T4 AND TSH
T4, Total: 9.7 ug/dL (ref 4.5–12.0)
TSH: 0.851 u[IU]/mL (ref 0.450–4.500)

## 2016-09-21 ENCOUNTER — Encounter: Payer: Self-pay | Admitting: Internal Medicine

## 2016-09-21 DIAGNOSIS — R7303 Prediabetes: Secondary | ICD-10-CM | POA: Insufficient documentation

## 2016-09-21 DIAGNOSIS — R634 Abnormal weight loss: Secondary | ICD-10-CM | POA: Insufficient documentation

## 2016-09-21 DIAGNOSIS — R252 Cramp and spasm: Secondary | ICD-10-CM | POA: Insufficient documentation

## 2016-09-21 NOTE — Assessment & Plan Note (Signed)
Etiology unclear,  Thyroid function is normal,  Protein stores normal,  Screening up to date including colonoscopy this summer and mammogram . Will recommend increasing the protein and fat in her diet.  Diabetes ruled out,  But a1c is "prediabetic" range at 5.8

## 2016-09-21 NOTE — Assessment & Plan Note (Signed)
Thyroid function is WNL on current dose.  No current changes needed.   Lab Results  Component Value Date   TSH 0.851 09/18/2016

## 2016-09-21 NOTE — Assessment & Plan Note (Signed)
Suggested by a1c,  With recurrent hypoglycemia during fasting states. Recommend high protein low carb diet.

## 2016-09-21 NOTE — Assessment & Plan Note (Signed)
She is overdue for annual follow up by CT

## 2016-09-21 NOTE — Assessment & Plan Note (Signed)
Etiology unclear.  Thyroid, calcium (ionized) electrolytes and ck are all normal

## 2016-10-01 ENCOUNTER — Ambulatory Visit
Admission: RE | Admit: 2016-10-01 | Discharge: 2016-10-01 | Disposition: A | Discharge status: Home / Self Care | Payer: Medicare Other | Source: Ambulatory Visit | Attending: Internal Medicine | Admitting: Internal Medicine

## 2016-10-01 ENCOUNTER — Encounter: Payer: Self-pay | Admitting: Internal Medicine

## 2016-10-01 DIAGNOSIS — R911 Solitary pulmonary nodule: Secondary | ICD-10-CM | POA: Diagnosis not present

## 2016-10-03 DIAGNOSIS — N393 Stress incontinence (female) (male): Secondary | ICD-10-CM | POA: Diagnosis not present

## 2016-10-03 DIAGNOSIS — N3946 Mixed incontinence: Secondary | ICD-10-CM | POA: Diagnosis not present

## 2016-10-03 DIAGNOSIS — Z681 Body mass index (BMI) 19 or less, adult: Secondary | ICD-10-CM | POA: Diagnosis not present

## 2016-10-03 DIAGNOSIS — N3941 Urge incontinence: Secondary | ICD-10-CM | POA: Diagnosis not present

## 2016-10-24 ENCOUNTER — Other Ambulatory Visit: Payer: Self-pay | Admitting: Internal Medicine

## 2016-10-24 NOTE — Telephone Encounter (Signed)
Pt last refill on Vitamin B injection was on 07/14/16. Pt last OV and labs were on 09/18/16. No future appts at this time.

## 2016-12-08 ENCOUNTER — Other Ambulatory Visit: Payer: Self-pay | Admitting: Internal Medicine

## 2016-12-10 ENCOUNTER — Other Ambulatory Visit: Payer: Self-pay | Admitting: Internal Medicine

## 2016-12-10 ENCOUNTER — Other Ambulatory Visit (INDEPENDENT_AMBULATORY_CARE_PROVIDER_SITE_OTHER): Payer: Medicare Other

## 2016-12-10 ENCOUNTER — Telehealth: Payer: Self-pay | Admitting: Internal Medicine

## 2016-12-10 DIAGNOSIS — R300 Dysuria: Secondary | ICD-10-CM

## 2016-12-10 LAB — URINALYSIS, MICROSCOPIC ONLY

## 2016-12-10 LAB — POCT URINALYSIS DIP (MANUAL ENTRY)
Bilirubin, UA: NEGATIVE
Glucose, UA: NEGATIVE
Ketones, POC UA: NEGATIVE
Nitrite, UA: POSITIVE — AB
Protein Ur, POC: 100 — AB
Spec Grav, UA: 1.02
Urobilinogen, UA: 1
pH, UA: 5.5

## 2016-12-10 MED ORDER — CEPHALEXIN 500 MG PO CAPS: 500.0000 mg | ORAL_CAPSULE | Freq: Four times a day (QID) | ORAL | 0 refills | Status: DC

## 2016-12-10 NOTE — Telephone Encounter (Signed)
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  I assume for rule out UTI?  Not for hypertension/proteinuria?

## 2016-12-10 NOTE — Telephone Encounter (Signed)
Lab appointment scheduled 

## 2016-12-10 NOTE — Telephone Encounter (Signed)
Is it ok to for pt to drop in and leave a urine sample? Or does she need to schedule an appt?

## 2016-12-10 NOTE — Progress Notes (Signed)
Hailey Hunt 

## 2016-12-10 NOTE — Telephone Encounter (Signed)
Pt states that she thinks she has a UTI and would like to come in a give a sample..  Please advise

## 2016-12-12 ENCOUNTER — Ambulatory Visit (INDEPENDENT_AMBULATORY_CARE_PROVIDER_SITE_OTHER): Payer: Medicare Other | Admitting: Internal Medicine

## 2016-12-12 ENCOUNTER — Encounter: Payer: Self-pay | Admitting: Internal Medicine

## 2016-12-12 VITALS — BP 118/74 | HR 79 | Wt 103.0 lb

## 2016-12-12 DIAGNOSIS — M899 Disorder of bone, unspecified: Secondary | ICD-10-CM | POA: Diagnosis not present

## 2016-12-12 DIAGNOSIS — M889 Osteitis deformans of unspecified bone: Secondary | ICD-10-CM | POA: Diagnosis not present

## 2016-12-12 DIAGNOSIS — E890 Postprocedural hypothyroidism: Secondary | ICD-10-CM

## 2016-12-12 DIAGNOSIS — M949 Disorder of cartilage, unspecified: Secondary | ICD-10-CM | POA: Diagnosis not present

## 2016-12-12 LAB — VITAMIN D 25 HYDROXY (VIT D DEFICIENCY, FRACTURES): VITD: 49.37 ng/mL (ref 30.00–100.00)

## 2016-12-12 NOTE — Progress Notes (Signed)
Patient ID: Hailey Hunt, female   DOB: 01/14/1946, 71 y.o.   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 Last visit 1 year ago.  She has mm cramps.  She also has L eye blurry vision.  She has a slightly high HbA1c recently: 5.8%. She will start Keflex for UTI.  Paget ds: She had a CTA abd/pelvis on 06/2015:incidental lesion:  Diffuse patchy sclerosis and cortical thickening of the left iliac bone, favored to represent Paget's disease.  She has had an anteverted L femur since her 36s. She has had pain in that his ever since.   Bone scan (01/02/2016):: FINDINGS: There is asymmetric increased uptake throughout the left iliac bone. The appearance and distribution is compatible with Paget's disease. Increased uptake in the distribution of the nasal bone is also noted, nonspecific. This could be seen with Paget's disease, fibrous dysplasia or chronic sinusitis. No findings identified to suggest bone metastases. Physiologic uptake is noted within the kidneys and urinary bladder.  IMPRESSION: 1. Increased uptake within the left iliac bone compatible with Paget's disease. 2. Central area of increased uptake in the region of the nasal bone which may also reflect Paget's disease of the skull.  I reviewed pt's DEXA scans: Date L1-L4 T score FN T score  01/22/2012 ([facility])  -0.60 LFN: -0.66    She denies fractures (traumatic vb fracture as a child) or falls.  She had dizziness.  She has been on the following OP treatments:  - Actonel for 2 years. This was apparently stopped due to recurrent kidney stones - last episode 2010.  Last vit D level was:  Lab Results  Component Value Date   VD25OH 49.43 11/19/2015   VD25OH 46.46 08/14/2014   VD25OH 62 08/29/2013   VD25OH 21 (L) 01/20/2012   She was on the high dose vitamin D dose, now on 1000 units a day.  She eats dairy and green leafy  vegetables.  No weight bearing exercises. She walks.   Surgical menopause was at 71 y/o.   Pt does not have a FH of osteoporosis.  No h/o hyper/hypocalcemia or hyperparathyroidism.  Lab Results  Component Value Date   CALCIUM 9.8 09/18/2016   CALCIUM 9.8 11/19/2015   CALCIUM 10.0 03/28/2015   CALCIUM 9.7 08/14/2014   CALCIUM 9.9 07/06/2014   CALCIUM 9.9 08/29/2013   CALCIUM 8.9 03/03/2012   CALCIUM 10.0 01/20/2012   She also has a history of Graves' disease, status post RAI treatment in the 8s. 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  She takes the Synthroid: - Fasting - In a.m. 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 Last BUN/Cr: Lab Results  Component Value Date   BUN 19 09/18/2016   CREATININE 0.96 09/18/2016    ROS: Constitutional: + weight loss, + fatigue, no subjective hyperthermia/hypothermia, + nocturia Eyes: + blurry vision, no xerophthalmia ENT: no sore throat, no nodules palpated in throat, no dysphagia/odynophagia, no hoarseness Cardiovascular: no CP/SOB/palpitations/leg swelling Respiratory: no cough/SOB Gastrointestinal: no N/V/D/C Musculoskeletal: + muscle/+ joint aches Skin: no rashes Neurological: no tremors/numbness/tingling/dizziness, + HA  I reviewed pt's medications, allergies, PMH, social hx, family hx, and changes were documented in the history of present illness. Otherwise, unchanged from my initial visit note.  Past Medical History:  Diagnosis Date  . Aberrant right subclavian artery   . Cataract    surgery  . Clotting disorder (HCC)    hx of bleeding after vaginal delivery, dr told her not to have any surgeries that are necessary  . Dysplasia, artery,  fibromuscular, renal ([place])   . Fibromuscular dysplasia ([place])   . Fibromuscular dysplasia of renal artery (HCC)    managed by Dr. [facility]  . Graves disease   . Heart murmur   . Heart murmur   . Hypertension   . Kidney stone   . Migraines   . Neuropathy with IgA monoclonal gammopathy (HCC)    Dr Mervyn Skeeters  . Paget's disease of the bone   . Pernicious anemia   . Thyroid disease   . Urinary incontinence, mixed    resolved with removal of ained sutures in bladder    Past Surgical History:  Procedure Laterality Date  . CATARACT EXTRACTION  Sep 10, 2011, Jan 13,2013  . COLONOSCOPY    . RENAL BIOPSY    . TONSILLECTOMY    . VAGINAL DELIVERY     x2  . VAGINAL HYSTERECTOMY    . WISDOM TOOTH EXTRACTION     Social History   Social History  . Marital status: Married    Spouse name: N/A  . Number of children: 2  . Years of education: N/A   Occupational History  . 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 Smoking status: Never Smoker  . Smokeless tobacco: Never Used  . Alcohol use Yes     Comment: once a year  . Drug use: No  . Sexual activity: Not Currently   Other Topics Concern  . Not on file   Social History Narrative  . No narrative on file   Current Outpatient Prescriptions on File Prior to Visit  Medication Sig Dispense Refill  . cephALEXin (KEFLEX) 500 MG capsule Take 1 capsule (500 mg total) by mouth 4 (four) times daily. 28 capsule 0  . cholecalciferol (VITAMIN D) 1000 UNITS tablet Take 1,000 Units by mouth daily.    . cyanocobalamin (,VITAMIN B-12,) 1000 MCG/ML injection INJECT 1 ML IM EVERY MONTH 10 mL 3  . Cyanocobalamin (VITAMIN B 12 PO) Take by mouth.    . losartan (COZAAR) 25 MG tablet TAKE 1 TABLET EVERY DAY 30 tablet 5  . SYNTHROID 50 MCG tablet TAKE 1 TABLET EVERY DAY BEFORE BREAKFAST 90 tablet 3  . Syringe/Needle, Disp, (SYRINGE 3CC/27GX1-1/4") 27G X 1-1/4" 3 ML MISC For B12 injections 100 each 0  . traMADol (ULTRAM) 50 MG  tablet Take 1 tablet (50 mg total) by mouth every 8 (eight) hours as needed. 30 tablet 3  . tretinoin (RETIN-A) 0.025 % cream Apply 1 application topically at bedtime. 45 g 6   Current Facility-Administered Medications on File Prior to Visit  Medication Dose Route Frequency Provider Last Rate Last Dose  . 0.9 %  sodium chloride infusion  500 mL Intravenous Continuous Jerene Bears, MD       Allergies  Allergen Reactions  . Voltaren [Diclofenac] Hypertension  . Adhesive [Tape]   . Demerol     HYPOTENSION  . Macrodantin   . Nsaids Hypertension  . Voltaren [Diclofenac Sodium] Other (See Comments)    FMD, increased  bp  . Codeine Nausea And Vomiting and Rash  . Nickel Rash  . Penicillins Rash  . Sulfa Antibiotics Rash    Fever   Family History  Problem Relation Age of Onset  . Anuerysm Mother   . COPD Mother   . Hypertension Mother   . Heart disease Mother   . Heart failure Mother   . Cancer Father     lung CA  . Heart disease Father   . Heart attack Father   . Heart disease Brother     viral cardiomyopathy, cocaine use  . Drug abuse Brother   . Cancer Maternal Aunt     esophageal and live rCA,  hisotory of  ETOH and tobacco  . Anuerysm Maternal Grandfather   . Cancer Paternal Grandmother     brain tumor  . Diabetes Sister   . Colon cancer Neg Hx    PE: BP 118/74 (BP Location: Left Arm, Patient Position: Sitting)   Pulse 79   Wt 103 lb (46.7 kg)   SpO2 97%   BMI 18.25 kg/m  Body mass index is 18.25 kg/m. Wt Readings from Last 3 Encounters:  12/12/16 103 lb (46.7 kg)  09/18/16 99 lb 12 oz (45.2 kg)  05/26/16 104 lb (47.2 kg)   Constitutional:Normal weight, in NAD. No kyphosis. Eyes: PERRLA, EOMI, no exophthalmos ENT: moist mucous membranes, no thyromegaly, no cervical lymphadenopathy Cardiovascular: RRR, No MRG Respiratory: CTA B Gastrointestinal: abdomen soft, NT, ND, BS+ Musculoskeletal: no deformities, strength intact in all 4 Skin: moist, warm, no  rashes Neurological: no tremor with outstretched hands, DTR normal in all 4  Assessment: 1. Paget disease  2. Osteopenia  3. Postablative hypothyroidism - RAI tx for Graves ds. In 1980s  Plan: 1. Paget ds. - Lesion in L hip observed incidentally on pelvic CTA at [facility] >> then again noticed on the Bone scan from 2017, along with a nasal focus - We again discussed about the fact that Paget disease is usually localized disorder, and only advanced cases will evolve towards enlargement of the bones, fractures, heart failure. - reviewed pt's alkaline phosphatase levels >> normal, including in 08/2016, she also does not have significant, new, pain in her hip, or warmth at the site.  - She would like to avoid bisphosphonates 2/2 previous kidney stones  - will see her back in 1 year  2. Osteopenia - She has a normal vitamin D, on supplementation and she also gets a good amount of calcium in her diet. We will checka  Vit D today. - No further intervention needed for now, but will repeat a DEXA scan   3. Postablative hypothyroidism - on brand name Synthroid - latest TFTs normal 08/2016 as reviewed with the pt - advised her to take the thyroid hormone every day, with water, at least 30 minutes before breakfast, separated by at least 4 hours from: - acid reflux medications - calcium - iron - multivitamins She is taking it correctly - will recheck in 1 year  Office Visit on 12/12/2016  Component Date Value Ref Range Status  . VITD 12/12/2016 49.37  30.00 - 100.00 ng/mL Final   Vitamin D level is great!  DEXA scan pending.  Philemon Kingdom, MD PhD [facility]

## 2016-12-12 NOTE — Patient Instructions (Signed)
Please stop at the lab.  We will schedule a new DEXA scan.  Please return in 1 year.

## 2016-12-13 ENCOUNTER — Encounter: Payer: Self-pay | Admitting: Internal Medicine

## 2016-12-13 LAB — URINE CULTURE

## 2016-12-16 ENCOUNTER — Ambulatory Visit (INDEPENDENT_AMBULATORY_CARE_PROVIDER_SITE_OTHER)
Admission: RE | Admit: 2016-12-16 | Discharge: 2016-12-16 | Disposition: A | Discharge status: Home / Self Care | Payer: Medicare Other | Source: Ambulatory Visit | Attending: Internal Medicine | Admitting: Internal Medicine

## 2016-12-16 DIAGNOSIS — M949 Disorder of cartilage, unspecified: Secondary | ICD-10-CM

## 2016-12-16 DIAGNOSIS — M899 Disorder of bone, unspecified: Secondary | ICD-10-CM | POA: Diagnosis not present

## 2016-12-17 ENCOUNTER — Ambulatory Visit (INDEPENDENT_AMBULATORY_CARE_PROVIDER_SITE_OTHER): Payer: Medicare Other

## 2016-12-17 VITALS — BP 102/62 | HR 59 | Temp 97.8°F | Resp 12 | Ht 63.0 in | Wt 102.8 lb

## 2016-12-17 DIAGNOSIS — Z0000 Encounter for general adult medical examination without abnormal findings: Secondary | ICD-10-CM | POA: Diagnosis not present

## 2016-12-17 NOTE — Patient Instructions (Addendum)
  Hailey Hunt , Thank you for taking time to come for your Medicare Wellness Visit. I appreciate your ongoing commitment to your health goals. Please review the following plan we discussed and let me know if I can assist you in the future.   Follow up with Dr. Derrel Nip as needed.    Bring a copy of your [place] and/or Living Will to be scanned into chart.  Have a great day! These are the goals we discussed: Goals    . Healthy Lifestyle          Stay active Stay hydrated Healthy diet       This is a list of the screening recommended for you and due dates:  Health Maintenance  Topic Date Due  . Mammogram  03/06/2017  . Tetanus Vaccine  07/15/2023  . Colon Cancer Screening  05/26/2026  . Flu Shot  Completed  . DEXA scan (bone density measurement)  Completed  .  Hepatitis C: One time screening is recommended by Center for Disease Control  (CDC) for  adults born from [birth year] through 1963.   Completed  . Pneumonia vaccines  Completed

## 2016-12-17 NOTE — Progress Notes (Signed)
Subjective:   Hailey Hunt is a 71 y.o. female who presents for Medicare Annual (Subsequent) preventive examination.  Review of Systems:  No ROS.  Medicare Wellness Visit.  Cardiac Risk Factors include: advanced age (>62men, >16 women)     Objective:     Vitals: BP 102/62 (BP Location: Left Arm, Patient Position: Sitting, Cuff Size: Normal)   Pulse (!) 59   Temp 97.8 F (36.6 C) (Oral)   Resp 12   Ht 5\' 3"  (1.6 m)   Wt 102 lb 12.8 oz (46.6 kg)   SpO2 98%   BMI 18.21 kg/m   Body mass index is 18.21 kg/m.   Tobacco History  Smoking Status  . Never Smoker  Smokeless Tobacco  . Never Used     Counseling given: Not Answered   Past Medical History:  Diagnosis Date  . Aberrant right subclavian artery   . Cataract    surgery  . Clotting disorder (HCC)    hx of bleeding after vaginal delivery, dr told her not to have any surgeries that are necessary  . Dysplasia, artery, fibromuscular, renal ([place])   . Fibromuscular dysplasia ([place])   . Fibromuscular dysplasia of renal artery (HCC)    managed by Dr. Gregary Signs [facility]  . Graves disease   . Heart murmur   . Heart murmur   . Hypertension   . Kidney stone   . Migraines   . Neuropathy with IgA monoclonal gammopathy (HCC)    Dr Mervyn Skeeters  . Paget's disease of the bone   . Pernicious anemia   . Thyroid disease   . Urinary incontinence, mixed    resolved with removal of ained sutures in bladder    Past Surgical History:  Procedure Laterality Date  . CATARACT EXTRACTION  Sep 10, 2011, Jan 13,2013  . COLONOSCOPY    . RENAL BIOPSY    . TONSILLECTOMY    . VAGINAL DELIVERY     x2  . VAGINAL HYSTERECTOMY    . WISDOM TOOTH EXTRACTION     Family History  Problem Relation Age of Onset  . Anuerysm Mother   . COPD Mother   . Hypertension Mother   . Heart disease Mother   . Heart failure Mother   . Cancer Father     lung CA  . Heart disease Father   . Heart attack Father   . Heart disease Brother     viral  cardiomyopathy, cocaine use  . Drug abuse Brother   . Cancer Maternal Aunt     esophageal and live rCA,  hisotory of  ETOH and tobacco  . Anuerysm Maternal Grandfather   . Cancer Paternal Grandmother     brain tumor  . Diabetes Sister   . Colon cancer Neg Hx    History  Sexual Activity  . Sexual activity: Not Currently    Outpatient Encounter Prescriptions as of 12/17/2016  Medication Sig  . cholecalciferol (VITAMIN D) 1000 UNITS tablet Take 1,000 Units by mouth daily.  . cyanocobalamin (,VITAMIN B-12,) 1000 MCG/ML injection INJECT 1 ML IM EVERY MONTH  . Cyanocobalamin (VITAMIN B 12 PO) Take by mouth.  . losartan (COZAAR) 25 MG tablet TAKE 1 TABLET EVERY DAY  . SYNTHROID 50 MCG tablet TAKE 1 TABLET EVERY DAY BEFORE BREAKFAST  . Syringe/Needle, Disp, (SYRINGE 3CC/27GX1-1/4") 27G X 1-1/4" 3 ML MISC For B12 injections  . traMADol (ULTRAM) 50 MG tablet Take 1 tablet (50 mg total) by mouth every 8 (eight) hours as  needed.  . tretinoin (RETIN-A) 0.025 % cream Apply 1 application topically at bedtime.  . [DISCONTINUED] cephALEXin (KEFLEX) 500 MG capsule Take 1 capsule (500 mg total) by mouth 4 (four) times daily.   Facility-Administered Encounter Medications as of 12/17/2016  Medication  . 0.9 %  sodium chloride infusion    Activities of Daily Living In your present state of health, do you have any difficulty performing the following activities: 12/17/2016  Hearing? N  Vision? N  Difficulty concentrating or making decisions? N  Walking or climbing stairs? N  Dressing or bathing? N  Doing errands, shopping? N  Preparing Food and eating ? N  Using the Toilet? N  In the past six months, have you accidently leaked urine? Y  Do you have problems with loss of bowel control? N  Managing your Medications? N  Managing your Finances? N  Housekeeping or managing your Housekeeping? N  Some recent data might be hidden    Patient Care Team: Crecencio Mc, MD as PCP - General (Internal  Medicine) Loyce Dys, MD (Nephrology) Minna Merritts, MD as Consulting Physician (Cardiology)    Assessment:    This is a routine wellness examination for Hailey Hunt. The goal of the wellness visit is to assist the patient how to close the gaps in care and create a preventative care plan for the patient.   Taking calcium VIT D as appropriate/Osteoporosis risk reviewed.  Medications reviewed; taking without issues or barriers.  Safety issues reviewed; smoke detectors in the home. No firearms in the home.  Wears seatbelts when driving or riding with others. Patient does wear sunscreen or protective clothing when in direct sunlight. No violence in the home.  Patient is alert, normal appearance, oriented to person/place/and time. Correctly identified the president of the [place], recall of 3/3 words, and performing simple calculations.  Patient displays appropriate judgement and can read correct time from watch face.  No new identified risk were noted.  No failures at ADL's or IADL's.   BMI- discussed the importance of a healthy diet, water intake and exercise. Educational material provided.   HTN- followed by PCP.  Dental- every six months.  Eye- Visual acuity not assessed per patient preference since they have regular follow up with the ophthalmologist.  Wears corrective lenses.  Sleep patterns- Sleeps 6-7 hours at night.  Wakes feeling rested.  Health maintenance gaps- closed.  Patient Concerns: None at this time. Follow up with PCP as needed.  Exercise Activities and Dietary recommendations Current Exercise Habits: Home exercise routine (Active around the home), Type of exercise: walking, Time (Minutes): 20, Frequency (Times/Week): 4, Weekly Exercise (Minutes/Week): 80, Intensity: Mild  Goals    . Healthy Lifestyle          Stay active Stay hydrated Healthy diet      Fall Risk Fall Risk  12/17/2016 03/19/2016 12/13/2014 08/29/2013  Falls in the past year? Yes Yes  No No  Number falls in past yr: 1 1 - -  Injury with Fall? No Yes - -  Follow up Falls prevention discussed - - -   Depression Screen PHQ 2/9 Scores 12/17/2016 03/19/2016 12/13/2014 08/29/2013  PHQ - 2 Score 0 0 0 0     Cognitive Function MMSE - Mini Mental State Exam 12/17/2016  Orientation to time 5  Orientation to Place 5  Registration 3  Attention/ Calculation 5  Recall 3  Language- name 2 objects 2  Language- repeat 1  Language- follow 3 step command  3  Language- read & follow direction 1  Write a sentence 1  Copy design 1  Total score 30        Immunization History  Administered Date(s) Administered  . Influenza Split 07/13/2012, 07/13/2014  . Influenza-Unspecified 07/04/2013, 06/30/2015, 07/14/2016  . PPD Test 11/19/2015  . Pneumococcal Conjugate-13 07/28/2013  . Pneumococcal Polysaccharide-23 07/07/2003, 07/06/2014  . Tdap 07/14/2013  . Zoster 09/14/2008   Screening Tests Health Maintenance  Topic Date Due  . MAMMOGRAM  03/06/2017  . TETANUS/TDAP  07/15/2023  . COLONOSCOPY  05/26/2026  . INFLUENZA VACCINE  Completed  . DEXA SCAN  Completed  . Hepatitis C Screening  Completed  . PNA vac Low Risk Adult  Completed      Plan:    End of life planning; Advance aging; Advanced directives discussed. Copy of current HCPOA/Living Will requested.    Medicare Attestation I have personally reviewed: The patient's medical and social history Their use of alcohol, tobacco or illicit drugs Their current medications and supplements The patient's functional ability including ADLs,fall risks, home safety risks, cognitive, and hearing and visual impairment Diet and physical activities Evidence for depression   The patient's weight, height, BMI, and visual acuity have been recorded in the chart.  I have made referrals and provided education to the patient based on review of the above and I have provided the patient with a written personalized care plan for preventive  services.    During the course of the visit the patient was educated and counseled about the following appropriate screening and preventive services:   Vaccines to include Pneumoccal, Influenza, Hepatitis B, Td, Zostavax, HCV  Colorectal cancer screening-UTD  Bone density screening-UTD  Glaucoma screening-annual eye exam  Mammography-UTD  Nutrition counseling   Patient Instructions (the written plan) was given to the patient.   Varney Biles, LPN  [id number]

## 2016-12-23 NOTE — Progress Notes (Signed)
  I have reviewed the above information and agree with above.   Teresa Tullo, MD 

## 2016-12-24 DIAGNOSIS — Z961 Presence of intraocular lens: Secondary | ICD-10-CM | POA: Diagnosis not present

## 2017-01-06 DIAGNOSIS — Z681 Body mass index (BMI) 19 or less, adult: Secondary | ICD-10-CM | POA: Diagnosis not present

## 2017-01-06 DIAGNOSIS — N342 Other urethritis: Secondary | ICD-10-CM | POA: Diagnosis not present

## 2017-01-26 DIAGNOSIS — Z720 Tobacco use: Secondary | ICD-10-CM | POA: Diagnosis not present

## 2017-01-26 DIAGNOSIS — Z885 Allergy status to narcotic agent status: Secondary | ICD-10-CM | POA: Diagnosis not present

## 2017-01-26 DIAGNOSIS — Z882 Allergy status to sulfonamides status: Secondary | ICD-10-CM | POA: Diagnosis not present

## 2017-01-26 DIAGNOSIS — I10 Essential (primary) hypertension: Secondary | ICD-10-CM | POA: Diagnosis not present

## 2017-01-26 DIAGNOSIS — N201 Calculus of ureter: Secondary | ICD-10-CM | POA: Diagnosis not present

## 2017-01-26 DIAGNOSIS — Z8249 Family history of ischemic heart disease and other diseases of the circulatory system: Secondary | ICD-10-CM | POA: Diagnosis not present

## 2017-01-26 DIAGNOSIS — Z681 Body mass index (BMI) 19 or less, adult: Secondary | ICD-10-CM | POA: Diagnosis not present

## 2017-01-26 DIAGNOSIS — N390 Urinary tract infection, site not specified: Secondary | ICD-10-CM | POA: Diagnosis not present

## 2017-01-26 DIAGNOSIS — E039 Hypothyroidism, unspecified: Secondary | ICD-10-CM | POA: Diagnosis not present

## 2017-01-26 DIAGNOSIS — Z87442 Personal history of urinary calculi: Secondary | ICD-10-CM | POA: Diagnosis not present

## 2017-01-26 DIAGNOSIS — R1032 Left lower quadrant pain: Secondary | ICD-10-CM | POA: Diagnosis not present

## 2017-01-26 DIAGNOSIS — Z880 Allergy status to penicillin: Secondary | ICD-10-CM | POA: Diagnosis not present

## 2017-01-27 DIAGNOSIS — K5730 Diverticulosis of large intestine without perforation or abscess without bleeding: Secondary | ICD-10-CM | POA: Diagnosis not present

## 2017-01-27 DIAGNOSIS — Z90710 Acquired absence of both cervix and uterus: Secondary | ICD-10-CM | POA: Diagnosis not present

## 2017-01-27 DIAGNOSIS — Z87442 Personal history of urinary calculi: Secondary | ICD-10-CM | POA: Diagnosis not present

## 2017-01-27 DIAGNOSIS — R911 Solitary pulmonary nodule: Secondary | ICD-10-CM | POA: Diagnosis not present

## 2017-01-27 DIAGNOSIS — M898X8 Other specified disorders of bone, other site: Secondary | ICD-10-CM | POA: Diagnosis not present

## 2017-01-27 DIAGNOSIS — R1032 Left lower quadrant pain: Secondary | ICD-10-CM | POA: Diagnosis not present

## 2017-01-27 DIAGNOSIS — R935 Abnormal findings on diagnostic imaging of other abdominal regions, including retroperitoneum: Secondary | ICD-10-CM | POA: Diagnosis not present

## 2017-01-29 ENCOUNTER — Encounter: Payer: Self-pay | Admitting: Internal Medicine

## 2017-01-29 DIAGNOSIS — R918 Other nonspecific abnormal finding of lung field: Secondary | ICD-10-CM

## 2017-02-02 NOTE — Addendum Note (Signed)
Addended by: Crecencio Mc on: 02/02/2017 01:01 PM   Modules accepted: Orders

## 2017-02-06 DIAGNOSIS — H26492 Other secondary cataract, left eye: Secondary | ICD-10-CM | POA: Diagnosis not present

## 2017-02-10 ENCOUNTER — Telehealth: Payer: Self-pay | Admitting: Internal Medicine

## 2017-02-12 ENCOUNTER — Ambulatory Visit
Admission: RE | Admit: 2017-02-12 | Discharge: 2017-02-12 | Disposition: A | Discharge status: Home / Self Care | Payer: Medicare Other | Source: Ambulatory Visit | Attending: Internal Medicine | Admitting: Internal Medicine

## 2017-02-12 ENCOUNTER — Encounter: Payer: Self-pay | Admitting: Internal Medicine

## 2017-02-12 ENCOUNTER — Other Ambulatory Visit: Payer: Self-pay | Admitting: Internal Medicine

## 2017-02-12 ENCOUNTER — Ambulatory Visit
Admission: RE | Admit: 2017-02-12 | Discharge: 2017-02-12 | Disposition: A | Discharge status: Home / Self Care | Payer: Self-pay | Source: Ambulatory Visit | Attending: Internal Medicine | Admitting: Internal Medicine

## 2017-02-12 DIAGNOSIS — R918 Other nonspecific abnormal finding of lung field: Secondary | ICD-10-CM | POA: Insufficient documentation

## 2017-03-09 DIAGNOSIS — Z1231 Encounter for screening mammogram for malignant neoplasm of breast: Secondary | ICD-10-CM | POA: Diagnosis not present

## 2017-03-09 LAB — HM MAMMOGRAPHY

## 2017-03-19 DIAGNOSIS — H268 Other specified cataract: Secondary | ICD-10-CM | POA: Diagnosis not present

## 2017-03-19 DIAGNOSIS — H40013 Open angle with borderline findings, low risk, bilateral: Secondary | ICD-10-CM | POA: Diagnosis not present

## 2017-03-23 ENCOUNTER — Other Ambulatory Visit: Payer: Self-pay | Admitting: Internal Medicine

## 2017-03-26 ENCOUNTER — Telehealth: Payer: Self-pay

## 2017-03-26 NOTE — Telephone Encounter (Signed)
PA submitted for synthroid 87mcg on covermymeds.

## 2017-03-30 ENCOUNTER — Other Ambulatory Visit: Payer: Self-pay | Admitting: Internal Medicine

## 2017-03-31 NOTE — Telephone Encounter (Signed)
Medication has been approved and pharmacy has been notified.

## 2017-06-03 ENCOUNTER — Other Ambulatory Visit: Payer: Self-pay | Admitting: Internal Medicine

## 2017-06-10 DIAGNOSIS — H5000 Unspecified esotropia: Secondary | ICD-10-CM | POA: Diagnosis not present

## 2017-06-10 DIAGNOSIS — H43811 Vitreous degeneration, right eye: Secondary | ICD-10-CM | POA: Diagnosis not present

## 2017-06-25 NOTE — Telephone Encounter (Signed)
Error

## 2017-06-29 DIAGNOSIS — Z23 Encounter for immunization: Secondary | ICD-10-CM | POA: Diagnosis not present

## 2017-07-01 DIAGNOSIS — I701 Atherosclerosis of renal artery: Secondary | ICD-10-CM | POA: Diagnosis not present

## 2017-07-01 DIAGNOSIS — I6529 Occlusion and stenosis of unspecified carotid artery: Secondary | ICD-10-CM | POA: Diagnosis not present

## 2017-07-01 DIAGNOSIS — I773 Arterial fibromuscular dysplasia: Secondary | ICD-10-CM | POA: Diagnosis not present

## 2017-07-13 ENCOUNTER — Ambulatory Visit (INDEPENDENT_AMBULATORY_CARE_PROVIDER_SITE_OTHER): Payer: Medicare Other | Admitting: Internal Medicine

## 2017-07-13 ENCOUNTER — Encounter: Payer: Self-pay | Admitting: Internal Medicine

## 2017-07-13 VITALS — BP 124/60 | HR 60 | Temp 97.5°F | Resp 15 | Ht 63.0 in | Wt 105.2 lb

## 2017-07-13 DIAGNOSIS — R7303 Prediabetes: Secondary | ICD-10-CM | POA: Diagnosis not present

## 2017-07-13 DIAGNOSIS — N3946 Mixed incontinence: Secondary | ICD-10-CM

## 2017-07-13 DIAGNOSIS — E781 Pure hyperglyceridemia: Secondary | ICD-10-CM

## 2017-07-13 DIAGNOSIS — R911 Solitary pulmonary nodule: Secondary | ICD-10-CM

## 2017-07-13 DIAGNOSIS — E890 Postprocedural hypothyroidism: Secondary | ICD-10-CM

## 2017-07-13 DIAGNOSIS — R918 Other nonspecific abnormal finding of lung field: Secondary | ICD-10-CM | POA: Diagnosis not present

## 2017-07-13 DIAGNOSIS — I10 Essential (primary) hypertension: Secondary | ICD-10-CM | POA: Diagnosis not present

## 2017-07-13 DIAGNOSIS — E559 Vitamin D deficiency, unspecified: Secondary | ICD-10-CM | POA: Diagnosis not present

## 2017-07-13 LAB — COMPREHENSIVE METABOLIC PANEL
ALT: 10 U/L (ref 0–35)
AST: 17 U/L (ref 0–37)
Albumin: 4.7 g/dL (ref 3.5–5.2)
Alkaline Phosphatase: 82 U/L (ref 39–117)
BUN: 17 mg/dL (ref 6–23)
CO2: 29 mEq/L (ref 19–32)
Calcium: 10 mg/dL (ref 8.4–10.5)
Chloride: 105 mEq/L (ref 96–112)
Creatinine, Ser: 0.99 mg/dL (ref 0.40–1.20)
GFR: 59.12 mL/min — ABNORMAL LOW (ref >60.00)
Glucose, Bld: 88 mg/dL (ref 70–99)
Potassium: 4.8 mEq/L (ref 3.5–5.1)
Sodium: 143 mEq/L (ref 135–145)
Total Bilirubin: 0.8 mg/dL (ref 0.2–1.2)
Total Protein: 6.8 g/dL (ref 6.0–8.3)

## 2017-07-13 LAB — URINALYSIS, MICROSCOPIC ONLY

## 2017-07-13 LAB — VITAMIN D 25 HYDROXY (VIT D DEFICIENCY, FRACTURES): VITD: 55.42 ng/mL (ref 30.00–100.00)

## 2017-07-13 LAB — POCT URINALYSIS DIPSTICK
Bilirubin, UA: NEGATIVE
Glucose, UA: NEGATIVE
Nitrite, UA: POSITIVE
Protein, UA: 30
Spec Grav, UA: 1.02 (ref 1.010–1.025)
Urobilinogen, UA: 0.2 E.U./dL
pH, UA: 5.5 (ref 5.0–8.0)

## 2017-07-13 LAB — TSH: TSH: 0.98 u[IU]/mL (ref 0.35–4.50)

## 2017-07-13 LAB — LIPID PANEL
Cholesterol: 196 mg/dL (ref 0–200)
HDL: 82.9 mg/dL (ref >39.00)
LDL Cholesterol: 96 mg/dL (ref 0–99)
NonHDL: 113.2
Total CHOL/HDL Ratio: 2
Triglycerides: 85 mg/dL (ref 0.0–149.0)
VLDL: 17 mg/dL (ref 0.0–40.0)

## 2017-07-13 LAB — T4, FREE: Free T4: 1.15 ng/dL (ref 0.60–1.60)

## 2017-07-13 LAB — HEMOGLOBIN A1C: Hgb A1c MFr Bld: 5.9 % (ref 4.6–6.5)

## 2017-07-13 MED ORDER — TRAMADOL HCL 50 MG PO TABS: 50.0000 mg | ORAL_TABLET | Freq: Three times a day (TID) | ORAL | 3 refills | Status: DC | PRN

## 2017-07-13 NOTE — Assessment & Plan Note (Signed)
Suggested by a1c,  With recurrent hypoglycemia during fasting states. Recommend high protein low carb diet.   Lab Results  Component Value Date   HGBA1C 5.8 09/18/2016

## 2017-07-13 NOTE — Addendum Note (Signed)
Addended by: Crecencio Mc on: 07/13/2017 05:12 PM   Modules accepted: Orders

## 2017-07-13 NOTE — Assessment & Plan Note (Signed)
Repeat CT at 6 months has been ordered . She has years of exposure to second hand smoke.

## 2017-07-13 NOTE — Assessment & Plan Note (Signed)
Thyroid function was WNL on current dose. repeat level is pending    Lab Results  Component Value Date   TSH 0.851 09/18/2016

## 2017-07-13 NOTE — Patient Instructions (Signed)
We will get yoru CT ordered once [facility] tells me how to order it!!

## 2017-07-13 NOTE — Assessment & Plan Note (Signed)
She has noted foul smelling urine again,  Which heralded her last UTI.  Repeat studies pending.

## 2017-07-13 NOTE — Progress Notes (Signed)
Subjective:  Patient ID: Hailey Hunt, female    DOB: Feb 10, 1946  Age: 71 y.o. MRN: [id number]  CC: The primary encounter diagnosis was Essential hypertension. Diagnoses of Vitamin D deficiency, Hypothyroidism following radioiodine therapy, Urinary incontinence, mixed, Pure hyperglyceridemia, Prediabetes, and Solitary pulmonary nodule were also pertinent to this visit.  HPI Hailey Hunt presents for follow up on FMD  And hypothyroidism  Evaluated by [facility] at [facility] for diplopia  attributed to muscle weakness (decompensation) and given rx for prism lenses which has helped    [employer] at [facility] in December,  [place] . Recurrent UTI.  Some bladder prolapse noted on CT   Did not tolerate cranberry tablets due to nausea    FMD:  Dr. Donneta Romberg at [facility] dc/d losartan unless BP is 140 or higher due to recurrent orthostasis / dizziness  Got the flu vaccine getting the shingrx in a month   insomnia averages 5 -6 hours,  Daughter has nocturnal seizures, ,  Does not want to  try meds      Outpatient Medications Prior to Visit  Medication Sig Dispense Refill  . cholecalciferol (VITAMIN D) 1000 UNITS tablet Take 1,000 Units by mouth daily.    . cyanocobalamin (,VITAMIN B-12,) 1000 MCG/ML injection INJECT 1 ML IM EVERY MONTH 10 mL 3  . Cyanocobalamin (VITAMIN B 12 PO) Take by mouth.    . SYNTHROID 50 MCG tablet TAKE 1 TABLET EVERY DAY ON AN EMPTY STOMACH WITH A GLASS OF WATER AT LEAST 30-60 MINUTES BEFORE BREAKFAST 90 tablet 1  . Syringe/Needle, Disp, (SYRINGE 3CC/27GX1-1/4") 27G X 1-1/4" 3 ML MISC For B12 injections 100 each 0  . tretinoin (RETIN-A) 0.025 % cream APPLY AT BEDTIME 45 g 1  . traMADol (ULTRAM) 50 MG tablet Take 1 tablet (50 mg total) by mouth every 8 (eight) hours as needed. 30 tablet 3  . losartan (COZAAR) 25 MG tablet TAKE 1 TABLET BY MOUTH DAILY (Patient not taking: Reported on 07/13/2017) 30 tablet 3   Facility-Administered Medications Prior to Visit  Medication Dose Route  Frequency Provider Last Rate Last Dose  . 0.9 %  sodium chloride infusion  500 mL Intravenous Continuous Pyrtle, Lajuan Lines, MD        Review of Systems;  Patient denies headache, fevers, malaise, unintentional weight loss, skin rash, eye pain, sinus congestion and sinus pain, sore throat, dysphagia,  hemoptysis , cough, dyspnea, wheezing, chest pain, palpitations, orthopnea, edema, abdominal pain, nausea, melena, diarrhea, constipation, flank pain, dysuria, hematuria, urinary  Frequency, nocturia, numbness, tingling, seizures,  Focal weakness, Loss of consciousness,  Tremor, insomnia, depression, anxiety, and suicidal ideation.      Objective:  BP 124/60 (BP Location: Left Arm, Patient Position: Sitting, Cuff Size: Normal)   Pulse 60   Temp (!) 97.5 F (36.4 C) (Oral)   Resp 15   Ht 5\' 3"  (1.6 m)   Wt 105 lb 3.2 oz (47.7 kg)   SpO2 98%   BMI 18.64 kg/m   BP Readings from Last 3 Encounters:  07/13/17 124/60  12/17/16 102/62  12/12/16 118/74    Wt Readings from Last 3 Encounters:  07/13/17 105 lb 3.2 oz (47.7 kg)  12/17/16 102 lb 12.8 oz (46.6 kg)  12/12/16 103 lb (46.7 kg)    General appearance: alert, cooperative and appears stated age Ears: normal TM's and external ear canals both ears Throat: lips, mucosa, and tongue normal; teeth and gums normal Neck: no adenopathy, no carotid bruit, supple, symmetrical, trachea midline  and thyroid not enlarged, symmetric, no tenderness/mass/nodules Back: symmetric, no curvature. ROM normal. No CVA tenderness. Lungs: clear to auscultation bilaterally Heart: regular rate and rhythm, S1, S2 normal, no murmur, click, rub or gallop Abdomen: soft, non-tender; bowel sounds normal; no masses,  no organomegaly Pulses: 2+ and symmetric Skin: Skin color, texture, turgor normal. No rashes or lesions Lymph nodes: Cervical, supraclavicular, and axillary nodes normal.  Lab Results  Component Value Date   HGBA1C 5.8 09/18/2016    Lab Results    Component Value Date   CREATININE 0.96 09/18/2016   CREATININE 0.95 11/19/2015   CREATININE 1.00 03/28/2015    Lab Results  Component Value Date   WBC 6.6 08/29/2013   HGB 12.4 08/29/2013   HCT 37.3 08/29/2013   PLT 308.0 08/29/2013   GLUCOSE 93 09/18/2016   CHOL 209 (H) 08/14/2014   TRIG 55.0 08/14/2014   HDL 71.90 08/14/2014   LDLDIRECT 92.0 11/19/2015   LDLCALC 126 (H) 08/14/2014   ALT 16 09/18/2016   AST 21 09/18/2016   NA 141 09/18/2016   K 4.1 09/18/2016   CL 105 09/18/2016   CREATININE 0.96 09/18/2016   BUN 19 09/18/2016   CO2 28 09/18/2016   TSH 0.851 09/18/2016   HGBA1C 5.8 09/18/2016   MICROALBUR 0.2 09/07/2013    Ct Chest Wo Contrast  Result Date: 02/12/2017 CLINICAL DATA:  Recent abnormal CT with nodular opacities in lung bases. EXAM: CT CHEST WITHOUT CONTRAST TECHNIQUE: Multidetector CT imaging of the chest was performed following the standard protocol without IV contrast. COMPARISON:  CT abdomen and pelvis including lung bases from [facility]. FINDINGS: Cardiovascular: There is no appreciable thoracic aortic aneurysm. Visualized great vessels appear unremarkable except for minimal calcification in the right common carotid artery. Pericardium is not appreciably thickened. Mediastinum/Nodes: Thyroid appears diminutive without focal lesion. There is no appreciable thoracic adenopathy. Lungs/Pleura: There is slight scarring in the right apex region. 4 mm nodular opacity in the posterior segment of the left lower lobe seen on axial slice 220 series 3. There is a focal ground-glass type opacity in the inferior aspect of the medial segment of the right middle lobe measuring 8 x 7 mm, seen on axial slice 254 series 3, unchanged from recent study. No similar lesions are apparent elsewhere in the chest. There is no edema or consolidation. No pleural effusion or focal. Upper Abdomen: Visualized upper abdominal structures appear unremarkable. Musculoskeletal:  There are no blastic or lytic bone lesions. IMPRESSION: 8 x 7 mm ground-glass type opacity in the inferior aspect of the medial segment right middle lobe. No other ground-glass type opacities are identified. There is a 4 mm solid nodular opacity in the posterior left base. Initial follow-up with CT at 6-12 months is recommended to confirm persistence. If persistent, repeat CT is recommended every 2 years until 5 years of stability has been established. This recommendation follows the consensus statement: Guidelines for Management of Incidental Pulmonary Nodules Detected on CT Images: From the Fleischner Society 2017; Radiology 2017; 284:228-243. No lung edema or consolidation.  No evident adenopathy. Electronically Signed   By: Lowella Grip III M.D.   On: 02/12/2017 10:31   Ct Outside Films Chest  Result Date: 03/13/2017 This examination belongs to an outside facility and is stored here for comparison purposes only.  Contact the originating outside institution for any associated report or interpretation.   Assessment & Plan:   Problem List Items Addressed This Visit    Hypertension - Primary  Relevant Orders   Comprehensive metabolic panel   Hypothyroidism following radioiodine therapy    Thyroid function was WNL on current dose. repeat level is pending    Lab Results  Component Value Date   TSH 0.851 09/18/2016         Relevant Orders   TSH   T4, free   Prediabetes    Suggested by a1c,  With recurrent hypoglycemia during fasting states. Recommend high protein low carb diet.   Lab Results  Component Value Date   HGBA1C 5.8 09/18/2016         Relevant Orders   Hemoglobin A1c   Solitary pulmonary nodule    Repeat CT at 6 months has been ordered . She has years of exposure to second hand smoke.       Urinary incontinence, mixed    She has noted foul smelling urine again,  Which heralded her last UTI.  Repeat studies pending.       Relevant Orders   POCT urinalysis  dipstick (Completed)   Urine Microscopic Only   Urine Culture   Vitamin D deficiency   Relevant Orders   VITAMIN D 25 Hydroxy (Vit-D Deficiency, Fractures)    Other Visit Diagnoses    Pure hyperglyceridemia       Relevant Orders   Lipid panel    A total of 25 minutes of face to face time was spent with patient more than half of which was spent in counselling about the above mentioned conditions  and coordination of care   I have discontinued Ms. Corl's losartan. I am also having her maintain her SYRINGE 3CC/27GX1-1/4", cholecalciferol, Cyanocobalamin (VITAMIN B 12 PO), cyanocobalamin, SYNTHROID, tretinoin, and traMADol. We will continue to administer sodium chloride.  Meds ordered this encounter  Medications  . traMADol (ULTRAM) 50 MG tablet    Sig: Take 1 tablet (50 mg total) by mouth every 8 (eight) hours as needed.    Dispense:  30 tablet    Refill:  3    Medications Discontinued During This Encounter  Medication Reason  . losartan (COZAAR) 25 MG tablet Patient has not taken in last 30 days  . traMADol (ULTRAM) 50 MG tablet Reorder    Follow-up: No Follow-up on file.   Crecencio Mc, MD

## 2017-07-14 ENCOUNTER — Other Ambulatory Visit: Payer: Self-pay | Admitting: Internal Medicine

## 2017-07-14 MED ORDER — CIPROFLOXACIN HCL 250 MG PO TABS: 250.0000 mg | ORAL_TABLET | Freq: Two times a day (BID) | ORAL | 0 refills | Status: DC

## 2017-07-15 ENCOUNTER — Encounter: Payer: Self-pay | Admitting: Internal Medicine

## 2017-07-15 LAB — URINE CULTURE
MICRO NUMBER:: 81146378
SPECIMEN QUALITY:: ADEQUATE

## 2017-07-24 ENCOUNTER — Ambulatory Visit
Admission: RE | Admit: 2017-07-24 | Discharge: 2017-07-24 | Disposition: A | Discharge status: Home / Self Care | Payer: Medicare Other | Source: Ambulatory Visit | Attending: Internal Medicine | Admitting: Internal Medicine

## 2017-07-24 DIAGNOSIS — R918 Other nonspecific abnormal finding of lung field: Secondary | ICD-10-CM | POA: Diagnosis not present

## 2017-09-08 ENCOUNTER — Other Ambulatory Visit: Payer: Self-pay | Admitting: Internal Medicine

## 2017-10-07 ENCOUNTER — Telehealth: Payer: Self-pay | Admitting: Internal Medicine

## 2017-10-09 ENCOUNTER — Ambulatory Visit (INDEPENDENT_AMBULATORY_CARE_PROVIDER_SITE_OTHER): Payer: Medicare Other

## 2017-10-09 ENCOUNTER — Ambulatory Visit: Payer: Self-pay | Admitting: *Deleted

## 2017-10-09 ENCOUNTER — Encounter: Payer: Self-pay | Admitting: Internal Medicine

## 2017-10-09 ENCOUNTER — Ambulatory Visit: Payer: Medicare Other | Admitting: Family

## 2017-10-09 ENCOUNTER — Ambulatory Visit (INDEPENDENT_AMBULATORY_CARE_PROVIDER_SITE_OTHER): Payer: Medicare Other | Admitting: Internal Medicine

## 2017-10-09 VITALS — BP 110/58 | HR 82 | Temp 98.3°F | Resp 15 | Ht 63.0 in | Wt 103.0 lb

## 2017-10-09 DIAGNOSIS — R310 Gross hematuria: Secondary | ICD-10-CM

## 2017-10-09 DIAGNOSIS — J111 Influenza due to unidentified influenza virus with other respiratory manifestations: Secondary | ICD-10-CM

## 2017-10-09 DIAGNOSIS — R69 Illness, unspecified: Secondary | ICD-10-CM

## 2017-10-09 DIAGNOSIS — R319 Hematuria, unspecified: Secondary | ICD-10-CM

## 2017-10-09 DIAGNOSIS — R05 Cough: Secondary | ICD-10-CM | POA: Diagnosis not present

## 2017-10-09 DIAGNOSIS — J101 Influenza due to other identified influenza virus with other respiratory manifestations: Secondary | ICD-10-CM

## 2017-10-09 LAB — CBC WITH DIFFERENTIAL/PLATELET
Basophils Absolute: 0 10*3/uL (ref 0.0–0.1)
Basophils Relative: 0.5 % (ref 0.0–3.0)
Eosinophils Absolute: 0 10*3/uL (ref 0.0–0.7)
Eosinophils Relative: 0 % (ref 0.0–5.0)
HCT: 36.5 % (ref 36.0–46.0)
Hemoglobin: 11.8 g/dL — ABNORMAL LOW (ref 12.0–15.0)
Lymphocytes Relative: 17.8 % (ref 12.0–46.0)
Lymphs Abs: 0.8 10*3/uL (ref 0.7–4.0)
MCHC: 32.3 g/dL (ref 30.0–36.0)
MCV: 90.6 fl (ref 78.0–100.0)
Monocytes Absolute: 0.5 10*3/uL (ref 0.1–1.0)
Monocytes Relative: 10.7 % (ref 3.0–12.0)
Neutro Abs: 3.3 10*3/uL (ref 1.4–7.7)
Neutrophils Relative %: 71 % (ref 43.0–77.0)
Platelets: 187 10*3/uL (ref 150.0–400.0)
RBC: 4.02 Mil/uL (ref 3.87–5.11)
RDW: 14.4 % (ref 11.5–15.5)
WBC: 4.7 10*3/uL (ref 4.0–10.5)

## 2017-10-09 LAB — URINALYSIS, ROUTINE W REFLEX MICROSCOPIC
Bilirubin Urine: NEGATIVE
Leukocytes, UA: NEGATIVE
Nitrite: POSITIVE — AB
Specific Gravity, Urine: 1.025 (ref 1.000–1.030)
Urine Glucose: NEGATIVE
Urobilinogen, UA: 0.2 (ref 0.0–1.0)
pH: 5 (ref 5.0–8.0)

## 2017-10-09 LAB — POCT URINALYSIS DIPSTICK
Glucose, UA: NEGATIVE
Nitrite, UA: POSITIVE
Protein, UA: 30
Spec Grav, UA: 1.02 (ref 1.010–1.025)
Urobilinogen, UA: 0.2 E.U./dL
pH, UA: 5 (ref 5.0–8.0)

## 2017-10-09 LAB — BASIC METABOLIC PANEL
BUN: 21 mg/dL (ref 6–23)
CO2: 27 mEq/L (ref 19–32)
Calcium: 9.1 mg/dL (ref 8.4–10.5)
Chloride: 101 mEq/L (ref 96–112)
Creatinine, Ser: 1.12 mg/dL (ref 0.40–1.20)
GFR: 51.24 mL/min — ABNORMAL LOW (ref >60.00)
Glucose, Bld: 100 mg/dL — ABNORMAL HIGH (ref 70–99)
Potassium: 3.9 mEq/L (ref 3.5–5.1)
Sodium: 137 mEq/L (ref 135–145)

## 2017-10-09 LAB — POCT INFLUENZA A/B
Influenza A, POC: POSITIVE — AB
Influenza B, POC: NEGATIVE

## 2017-10-09 MED ORDER — LEVOFLOXACIN 500 MG PO TABS: 500.0000 mg | ORAL_TABLET | Freq: Every day | ORAL | 0 refills | Status: DC

## 2017-10-09 MED ORDER — PREDNISONE 10 MG PO TABS
ORAL_TABLET | ORAL | 0 refills | Status: DC
Start: 2017-10-09 — End: 2017-12-04

## 2017-10-09 MED ORDER — OSELTAMIVIR PHOSPHATE 75 MG PO CAPS: 75.0000 mg | ORAL_CAPSULE | Freq: Every day | ORAL | 0 refills | Status: DC

## 2017-10-09 MED ORDER — PROMETHAZINE-DM 6.25-15 MG/5ML PO SYRP: 5.0000 mL | ORAL_SOLUTION | Freq: Four times a day (QID) | ORAL | 1 refills | Status: DC | PRN

## 2017-10-09 MED ORDER — OSELTAMIVIR PHOSPHATE 75 MG PO CAPS: 75.0000 mg | ORAL_CAPSULE | Freq: Two times a day (BID) | ORAL | 0 refills | Status: DC

## 2017-10-09 NOTE — Progress Notes (Addendum)
Subjective:  Patient ID: Hailey Hunt, female    DOB: 22-Jun-1946  Age: 72 y.o. MRN: [id number]  CC: The primary encounter diagnosis was Gross hematuria. Diagnoses of Influenza with respiratory symptoms, Influenza A (H1N1), and Hematuria, unspecified type were also pertinent to this visit.  HPI POPPY MCAFEE presents for EVAL AND TREATMENT OF FLU LIKE ILLNESS.    symptoms started on wednesday with fever, fatigue,  Anorexia,  And respiratory symptoms accompanied by fevers to 103 .  Afebrile today but has been taking tylenol   Urine has been dark brown.   Recent travel: no  Recent influenza vaccine?  yes   POCT influenza is positive for A     Outpatient Medications Prior to Visit  Medication Sig Dispense Refill  . cholecalciferol (VITAMIN D) 1000 UNITS tablet Take 1,000 Units by mouth daily.    . ciprofloxacin (CIPRO) 250 MG tablet Take 1 tablet (250 mg total) by mouth 2 (two) times daily. 10 tablet 0  . cyanocobalamin (,VITAMIN B-12,) 1000 MCG/ML injection INJECT 1 ML IM EVERY MONTH 10 mL 3  . Cyanocobalamin (VITAMIN B 12 PO) Take by mouth.    . SYNTHROID 50 MCG tablet TAKE ONE TABLET DAILY ON EMPTY STOMACH WITH GLASS OF WATER AT LEAST 30-60 MINUTES BEFORE BREAKFAST 90 tablet 1  . Syringe/Needle, Disp, (SYRINGE 3CC/27GX1-1/4") 27G X 1-1/4" 3 ML MISC For B12 injections 100 each 0  . traMADol (ULTRAM) 50 MG tablet Take 1 tablet (50 mg total) by mouth every 8 (eight) hours as needed. 30 tablet 3  . tretinoin (RETIN-A) 0.025 % cream APPLY AT BEDTIME 45 g 1   Facility-Administered Medications Prior to Visit  Medication Dose Route Frequency Provider Last Rate Last Dose  . 0.9 %  sodium chloride infusion  500 mL Intravenous Continuous Pyrtle, Lajuan Lines, MD        Review of Systems;  Patient denies headache, fevers, malaise, unintentional weight loss, skin rash, eye pain, sinus congestion and sinus pain, sore throat, dysphagia,  hemoptysis , cough, dyspnea, wheezing, chest pain,  palpitations, orthopnea, edema, abdominal pain, nausea, melena, diarrhea, constipation, flank pain, dysuria, hematuria, urinary  Frequency, nocturia, numbness, tingling, seizures,  Focal weakness, Loss of consciousness,  Tremor, insomnia, depression, anxiety, and suicidal ideation.      Objective:  BP (!) 110/58 (BP Location: Left Arm, Patient Position: Sitting, Cuff Size: Normal)   Pulse 82   Temp 98.3 F (36.8 C) (Oral)   Resp 15   Ht 5\' 3"  (1.6 m)   Wt 103 lb (46.7 kg)   SpO2 97%   BMI 18.25 kg/m   BP Readings from Last 3 Encounters:  10/09/17 (!) 110/58  07/13/17 124/60  12/17/16 102/62    Wt Readings from Last 3 Encounters:  10/09/17 103 lb (46.7 kg)  07/13/17 105 lb 3.2 oz (47.7 kg)  12/17/16 102 lb 12.8 oz (46.6 kg)    General appearance: alert, cooperative and appears ill Ears: normal TM's and external ear canals both ears Throat: lips, mucosa, and tongue normal; teeth and gums normal Neck: no adenopathy, no carotid bruit, supple, symmetrical, trachea midline and thyroid not enlarged, symmetric, no tenderness/mass/nodules Back: symmetric, no curvature. ROM normal. No CVA tenderness. Lungs: clear to auscultation bilaterally Heart: regular rate and rhythm, S1, S2 normal, no murmur, click, rub or gallop Abdomen: soft, non-tender; bowel sounds normal; no masses,  no organomegaly Pulses: 2+ and symmetric Skin: Skin color, texture, turgor poor,  No rashes or lesions Lymph nodes: Cervical  LAD,  supraclavicular, and axillary nodes normal.  Lab Results  Component Value Date   HGBA1C 5.9 07/13/2017   HGBA1C 5.8 09/18/2016    Lab Results  Component Value Date   CREATININE 1.12 10/09/2017   CREATININE 0.99 07/13/2017   CREATININE 0.96 09/18/2016    Lab Results  Component Value Date   WBC 4.7 10/09/2017   HGB 11.8 (L) 10/09/2017   HCT 36.5 10/09/2017   PLT 187.0 10/09/2017   GLUCOSE 100 (H) 10/09/2017   CHOL 196 07/13/2017   TRIG 85.0 07/13/2017   HDL 82.90  07/13/2017   LDLDIRECT 92.0 11/19/2015   LDLCALC 96 07/13/2017   ALT 10 07/13/2017   AST 17 07/13/2017   NA 137 10/09/2017   K 3.9 10/09/2017   CL 101 10/09/2017   CREATININE 1.12 10/09/2017   BUN 21 10/09/2017   CO2 27 10/09/2017   TSH 0.98 07/13/2017   HGBA1C 5.9 07/13/2017   MICROALBUR 0.2 09/07/2013    Ct Chest Wo Contrast  Result Date: 07/24/2017 CLINICAL DATA:  Follow-up pulmonary nodules. EXAM: CT CHEST WITHOUT CONTRAST TECHNIQUE: Multidetector CT imaging of the chest was performed following the standard protocol without IV contrast. COMPARISON:  02/12/2017 chest CT. FINDINGS: Cardiovascular: Normal heart size. No significant pericardial fluid/thickening. Mildly atherosclerotic nonaneurysmal thoracic aorta. Aberrant nonaneurysmal right subclavian artery arising from the distal aortic arch with retroesophageal course . Normal caliber pulmonary arteries. Mediastinum/Nodes: No discrete thyroid nodules. Unremarkable esophagus. No pathologically enlarged axillary, mediastinal or gross hilar lymph nodes, noting limited sensitivity for the detection of hilar adenopathy on this noncontrast study. Lungs/Pleura: No pneumothorax. No pleural effusion. Punctate 2 mm posterior right lower lobe granuloma is stable. Two solid left lower lobe pulmonary nodules are stable, largest 5 mm (series 3/ image 138). No additional significant pulmonary nodules. Previously described opacity in the inferior right middle lobe is stable and seen to represent a tiny thin parenchymal band on coronal sequence, compatible with postinfectious/ postinflammatory scar. No acute consolidative airspace disease or lung masses. Upper abdomen: Subcentimeter hypodense anterior left liver lobe lesion is too small to characterize and stable, considered benign. Musculoskeletal:  No aggressive appearing focal osseous lesions. IMPRESSION: 1. Interval stability of small solid pulmonary nodules in the left lower lobe, considered benign. 2.  No active cardiopulmonary disease. 3. Aberrant right subclavian artery. Aortic Atherosclerosis (ICD10-I70.0). Electronically Signed   By: Ilona Sorrel M.D.   On: 07/24/2017 11:12    Assessment & Plan:   Problem List Items Addressed This Visit    Hematuria - Primary    UA has 7-10 RBCS per HPF.  SG is high,  Suggesting dehdyration . Culture is pending       Relevant Orders   POCT urinalysis dipstick (Completed)   Urinalysis, Routine w reflex microscopic (Completed)   Urine Culture   Influenza A (H1N1)    With dehydration suggested by today's labs,  And normal chest xray.  Tamiflu,  Supportive care,  Add abx in 48 hours if cough worsens or fevers continue .  Treating Whitney who is also febrile,  And prophylactic dose for Erin.       Relevant Medications   oseltamivir (TAMIFLU) 75 MG capsule    Other Visit Diagnoses    Influenza with respiratory symptoms       Relevant Medications   oseltamivir (TAMIFLU) 75 MG capsule   Other Relevant Orders   POCT Influenza A/B (Completed)   CBC with Differential/Platelet (Completed)   DG Chest 2 View (Completed)   Basic  metabolic panel (Completed)     A total of 25 minutes of face to face time was spent with patient more than half of which was spent in counselling about the above mentioned conditions  and coordination of care   I have discontinued Keniah B. Gasner's oseltamivir. I am also having her start on oseltamivir, promethazine-dextromethorphan, levofloxacin, and predniSONE. Additionally, I am having her maintain her SYRINGE 3CC/27GX1-1/4", cholecalciferol, Cyanocobalamin (VITAMIN B 12 PO), cyanocobalamin, tretinoin, traMADol, ciprofloxacin, and SYNTHROID. We will continue to administer sodium chloride.  Meds ordered this encounter  Medications  . DISCONTD: oseltamivir (TAMIFLU) 75 MG capsule    Sig: Take 1 capsule (75 mg total) by mouth daily.    Dispense:  10 capsule    Refill:  0  . oseltamivir (TAMIFLU) 75 MG capsule    Sig: Take  1 capsule (75 mg total) by mouth 2 (two) times daily.    Dispense:  10 capsule    Refill:  0  . promethazine-dextromethorphan (PROMETHAZINE-DM) 6.25-15 MG/5ML syrup    Sig: Take 5 mLs by mouth 4 (four) times daily as needed for cough.    Dispense:  180 mL    Refill:  1  . levofloxacin (LEVAQUIN) 500 MG tablet    Sig: Take 1 tablet (500 mg total) by mouth daily.    Dispense:  7 tablet    Refill:  0  . predniSONE (DELTASONE) 10 MG tablet    Sig: 6 tablets on Day 1 , then reduce by 1 tablet daily until gone    Dispense:  21 tablet    Refill:  0    Medications Discontinued During This Encounter  Medication Reason  . oseltamivir (TAMIFLU) 75 MG capsule     Follow-up: No Follow-up on file.   Crecencio Mc, MD

## 2017-10-09 NOTE — Patient Instructions (Addendum)
You have Influenza A  Start the Tamiflu ASAP.  Twice daily for 5 days for you and Kavin Leech should start the prophylactic dose (1 daily for 10 days) but convert to twice daily if you start feeling ill  Cough syrup has phenergan and dextomethrophan it it so you can use it every 6 hours   Start the prednisone if you do not start to feel better by Sunday,  Add the Levaquin if you continue to have fevers or develop green or blood streaked sputum

## 2017-10-09 NOTE — Telephone Encounter (Signed)
Pt's daughter, Alexandrya Chim, called stating that the pt is lethargic, has temperature 103.6 on Wednesday and 101.3 today; pt has blood in her urine last night, and has flu like symptoms cough, nausea, weak, no appetite, and is not urinating as much as usual; pt's daughter says that she refuses to go to a walk in clinic or ED, and would like to see Dr Derrel Nip; recommendations made per nurse triage; no appointments available at [facility] today; pt's daughter was offered the opportunity for her mother to be seen at another [facility] practice but she declined and would like for [place] to call the office when the office opens then if there is still no availalibility she will consider another office; her contact number is [phone]. Reason for Disposition . [1] Drinking very little AND [2] dehydration suspected (e.g., no urine > 12 hours, very dry mouth, very lightheaded)  Answer Assessment - Initial Assessment Questions 1. TEMPERATURE: "What is the most recent temperature?"  "How was it measured?"      101.1 10/09/17 at 0715; pt has been taking tylenol around the clock since Wednesday  2. ONSET: "When did the fever start?"      \Wednesday 10/07/17 3. SYMPTOMS: "Do you have any other symptoms besides the fever?"  (e.g., colds, headache, sore throat, earache, cough, rash, diarrhea, vomiting, abdominal pain)     Lethargy, blood in urine, nausea, no appetite, less frequent urination  4. CAUSE: If there are no symptoms, ask: "What do you think is causing the fever?"      Not sure 5. CONTACTS: "Does anyone else in the family have an infection?"     Yes went to ED with family member 36. TREATMENT: "What have you done so far to treat this fever?" (e.g., medications)     Tylenol around the clock since Wednesday 500 mg q 6 hours 7. IMMUNOCOMPROMISE: "Do you have of the following: diabetes, HIV positive, splenectomy, cancer chemotherapy, chronic steroid treatment, transplant patient, etc."    MS 8. PREGNANCY: "Is there any  chance you are pregnant?" "When was your last menstrual period?"     no 9. 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

## 2017-10-09 NOTE — Telephone Encounter (Addendum)
Patient Daughter did accept [place] appointment at 2:20 but ask that I make PCP aware advised I would .

## 2017-10-09 NOTE — Telephone Encounter (Signed)
Contacted Cathy at [facility] and no appointments are available; conference call initiated with, pt's daughter [name]; pt offered and accepted appointment with Jodi Mourning at [facility] 1420 today; she verifies understanding.

## 2017-10-09 NOTE — Telephone Encounter (Signed)
Patient scheduled for 1 will arrive at 12.45.

## 2017-10-09 NOTE — Telephone Encounter (Signed)
Please offer her 1:00 today with me.  Ask her to come early to Get a rapid flu test , CBC urinalysis and culture ,and chest x ray done prirorto appt .  I have ordered the non POCT tests

## 2017-10-09 NOTE — Addendum Note (Signed)
Addended by: Crecencio Mc on: 10/09/2017 09:22 AM   Modules accepted: Orders

## 2017-10-11 ENCOUNTER — Encounter: Payer: Self-pay | Admitting: Internal Medicine

## 2017-10-11 DIAGNOSIS — R319 Hematuria, unspecified: Secondary | ICD-10-CM | POA: Insufficient documentation

## 2017-10-11 DIAGNOSIS — J101 Influenza due to other identified influenza virus with other respiratory manifestations: Secondary | ICD-10-CM | POA: Insufficient documentation

## 2017-10-11 NOTE — Assessment & Plan Note (Signed)
With dehydration suggested by today's labs,  And normal chest xray.  Tamiflu,  Supportive care,  Add abx in 48 hours if cough worsens or fevers continue .  Treating Hailey Hunt who is also febrile,  And prophylactic dose for Hailey Hunt.

## 2017-10-11 NOTE — Assessment & Plan Note (Signed)
UA has 7-10 RBCS per HPF.  SG is high,  Suggesting dehdyration . Culture is pending

## 2017-10-12 ENCOUNTER — Other Ambulatory Visit: Payer: Self-pay | Admitting: Internal Medicine

## 2017-10-12 DIAGNOSIS — Z681 Body mass index (BMI) 19 or less, adult: Secondary | ICD-10-CM | POA: Diagnosis not present

## 2017-10-12 DIAGNOSIS — N390 Urinary tract infection, site not specified: Secondary | ICD-10-CM | POA: Diagnosis not present

## 2017-10-12 DIAGNOSIS — N393 Stress incontinence (female) (male): Secondary | ICD-10-CM | POA: Diagnosis not present

## 2017-10-12 DIAGNOSIS — R319 Hematuria, unspecified: Secondary | ICD-10-CM

## 2017-10-12 DIAGNOSIS — N952 Postmenopausal atrophic vaginitis: Secondary | ICD-10-CM | POA: Diagnosis not present

## 2017-10-12 LAB — URINE CULTURE
MICRO NUMBER:: 90046813
SPECIMEN QUALITY:: ADEQUATE

## 2017-10-13 ENCOUNTER — Encounter: Payer: Self-pay | Admitting: Internal Medicine

## 2017-11-11 DIAGNOSIS — L821 Other seborrheic keratosis: Secondary | ICD-10-CM | POA: Diagnosis not present

## 2017-12-04 ENCOUNTER — Other Ambulatory Visit: Payer: Self-pay

## 2017-12-04 ENCOUNTER — Ambulatory Visit: Payer: Self-pay | Admitting: *Deleted

## 2017-12-04 ENCOUNTER — Encounter: Payer: Self-pay | Admitting: Family Medicine

## 2017-12-04 ENCOUNTER — Ambulatory Visit (INDEPENDENT_AMBULATORY_CARE_PROVIDER_SITE_OTHER): Payer: Medicare Other | Admitting: Family Medicine

## 2017-12-04 VITALS — BP 140/88 | HR 83 | Temp 97.4°F | Wt 105.0 lb

## 2017-12-04 DIAGNOSIS — G4489 Other headache syndrome: Secondary | ICD-10-CM

## 2017-12-04 DIAGNOSIS — I10 Essential (primary) hypertension: Secondary | ICD-10-CM | POA: Diagnosis not present

## 2017-12-04 NOTE — Assessment & Plan Note (Signed)
Patient with history of hypertension.  More recently has been well controlled off of medication.  Has not consistently checked recently though does not note blood pressures being this high in the recent past.  Currently only symptom is a dull headache.  We will check a CMP.  She will monitor her blood pressure through the weekend and contact us on Monday with her readings.  If elevated through the weekend would consider adding low-dose losartan.  She is advised to call the on-call line if her blood pressure went back up to the 180/120 range.  Luckily I am on-call this weekend and will be familiar with her history.  Discussed if she were to develop sudden onset worst headache of life, numbness, weakness, vision changes, chest pain, shortness of breath or any new symptoms being evaluated in the emergency department.  She will follow-up in 1-2 weeks if started on medication or if blood pressure is not maintaining at a good level.

## 2017-12-04 NOTE — Patient Instructions (Addendum)
Nice to see you. We will check renal function given your BP. Please monitor your BP over the weekend. If it gets up to 180/120 or higher please call us.  Please call us on Monday to let us know what your BP did over the weekend.  If you develop sudden onset worst headache of your life, numbness, weakness, vision changes, chest pain, shortness of breath, or any new or changing symptoms please seek medical attention.

## 2017-12-04 NOTE — Progress Notes (Signed)
Tommi Rumps, MD Phone: [phone]  Hailey Hunt is a 72 y.o. female who presents today for same day visit.   Patient notes she woke up in the middle the night with a headache.  This is not uncommon for her and has been a chronic issue with headaches.  She does have a history of migraines.  She notes she continued to have a dull nagging headache that encompasses her whole head throughout the day.  She notes she went to bend over to pick something up early this afternoon and stood up and felt a sudden throbbing and whooshing type of headache.  Notes it was not the worst headache she has ever had.  No thunderclap headache.  This headache is different than her typical headaches.  Notes that headache lasted for about 30 minutes and resolved.  That prompted her to check her blood pressure and it was 160/90 on the automated cuff and 180-200/100 on her manual check.  She notes previously having been on blood pressure medication though she came off of this related to lightheadedness and orthostasis.  Previously on amlodipine and HCTZ though those were discontinued in favor of losartan given her fibromuscular dysplasia.  She has since come off of losartan.  She notes no numbness, weakness, vision changes, neck pain, chest pain, or shortness of breath.  Her headache has eased up though still has a mild dull headache.  She reports chronic right pupillary dilation related to an injury when she was younger.  Social History   Tobacco Use  Smoking Status Never Smoker  Smokeless Tobacco Never Used     ROS see history of present illness  Objective  Physical Exam Vitals:   12/04/17 1555  BP: 140/88  Pulse: 83  Temp: (!) 97.4 F (36.3 C)  SpO2: 98%    BP Readings from Last 3 Encounters:  12/04/17 140/88  10/09/17 (!) 110/58  07/13/17 124/60   Wt Readings from Last 3 Encounters:  12/04/17 105 lb (47.6 kg)  10/09/17 103 lb (46.7 kg)  07/13/17 105 lb 3.2 oz (47.7 kg)    Physical Exam    Constitutional: No distress.  Cardiovascular: Normal rate, regular rhythm and normal heart sounds.  Pulmonary/Chest: Effort normal and breath sounds normal.  Musculoskeletal: She exhibits no edema.  Neurological: She is alert. Gait normal.  Right pupil slightly enlarged compared to left, both pupils are reactive to light equally, otherwise CN 2-12 intact, 5/5 strength in bilateral biceps, triceps, grip, quads, hamstrings, plantar and dorsiflexion, sensation to light touch intact in bilateral UE and LE, normal rapid alternating movements, normal finger to nose  Skin: Skin is warm and dry. She is not diaphoretic.     Assessment/Plan: Please see individual problem list.  Hypertension Patient with history of hypertension.  More recently has been well controlled off of medication.  Has not consistently checked recently though does not note blood pressures being this high in the recent past.  Currently only symptom is a dull headache.  We will check a CMP.  She will monitor her blood pressure through the weekend and contact us on Monday with her readings.  If elevated through the weekend would consider adding low-dose losartan.  She is advised to call the on-call line if her blood pressure went back up to the 180/120 range.  Luckily I am on-call this weekend and will be familiar with her history.  Discussed if she were to develop sudden onset worst headache of life, numbness, weakness, vision changes, chest pain,  shortness of breath or any new symptoms being evaluated in the emergency department.  She will follow-up in 1-2 weeks if started on medication or if blood pressure is not maintaining at a good level.  Headache Chronic history of nocturnal headaches.  Woke up last night with a headache.  Did have new onset type of headache today after bending over and standing up.  Blood pressure found to be elevated at that time though has improved.  That headache has resolved.  No sudden onset thunderclap worst  headache of life.  Neurologically intact.  Discussed headache likely related to her blood pressure.  Advised monitoring her blood pressures through the weekend and her headaches.  If she develops sudden onset worst headache of life, neurological issues, or cardiac issues she will be evaluated in the emergency department.   Orders Placed This Encounter  Procedures  . Comp Met (CMET)    No orders of the defined types were placed in this encounter.    Tommi Rumps, MD [employer]

## 2017-12-04 NOTE — Assessment & Plan Note (Signed)
Chronic history of nocturnal headaches.  Woke up last night with a headache.  Did have new onset type of headache today after bending over and standing up.  Blood pressure found to be elevated at that time though has improved.  That headache has resolved.  No sudden onset thunderclap worst headache of life.  Neurologically intact.  Discussed headache likely related to her blood pressure.  Advised monitoring her blood pressures through the weekend and her headaches.  If she develops sudden onset worst headache of life, neurological issues, or cardiac issues she will be evaluated in the emergency department.

## 2017-12-04 NOTE — Telephone Encounter (Signed)
Patient is calling to report that she has had a headache since yesterday and she states her head hurt when she bends over. She has checked her BP and she has had varying elevated readings. She states she will not go to the ED for evaluation. Call to office to see if they can see her today- flow has put her on the schedule- patient notified. Reason for Disposition . [4] Systolic BP  >= 496 OR Diastolic >= 759 AND [1] cardiac or neurologic symptoms (e.g., chest pain, difficulty breathing, unsteady gait, blurred vision)  Answer Assessment - Initial Assessment Questions 1. BLOOD PRESSURE: "What is the blood pressure?" "Did you take at least two measurements 5 minutes apart?"     180/90 manual     160/90 auto   - patient is going to retake manual- 180/90 and 162/82 P 90 2. ONSET: "When did you take your blood pressure?"     3:00 pm 3. HOW: "How did you obtain the blood pressure?" (e.g., visiting nurse, automatic home BP monitor)     Taking it herself- manual 4. HISTORY: "Do you have a history of high blood pressure?"      Vascular problems 5. MEDICATIONS: "Are you taking any medications for blood pressure?" "Have you missed any doses recently?"     Patient does not use any blood pressure medication 6. OTHER SYMPTOMS: "Do you have any symptoms?" (e.g., headache, chest pain, blurred vision, difficulty breathing, weakness)     headache 7. PREGNANCY: "Is there any chance you are pregnant?" "When was your last menstrual period?"     n/a  Protocols used: HIGH BLOOD PRESSURE-A-AH

## 2017-12-05 LAB — COMPREHENSIVE METABOLIC PANEL
AG Ratio: 2.6 (calc) — ABNORMAL HIGH (ref 1.0–2.5)
ALT: 11 U/L (ref 6–29)
AST: 20 U/L (ref 10–35)
Albumin: 4.4 g/dL (ref 3.6–5.1)
Alkaline phosphatase (APISO): 79 U/L (ref 33–130)
BUN: 16 mg/dL (ref 7–25)
CO2: 28 mmol/L (ref 20–32)
Calcium: 9.5 mg/dL (ref 8.6–10.4)
Chloride: 103 mmol/L (ref 98–110)
Creat: 0.99 mg/dL (ref 0.50–0.99)
Globulin: 1.7 g/dL (calc) — ABNORMAL LOW (ref 1.9–3.7)
Glucose, Bld: 99 mg/dL (ref 65–99)
Potassium: 4.3 mmol/L (ref 3.5–5.3)
Sodium: 141 mmol/L (ref 135–146)
Total Bilirubin: 0.4 mg/dL (ref 0.2–1.2)
Total Protein: 6.1 g/dL (ref 6.1–8.1)

## 2017-12-07 ENCOUNTER — Telehealth: Payer: Self-pay | Admitting: *Deleted

## 2017-12-07 NOTE — Telephone Encounter (Signed)
Copied from [place] [id number]. Topic: Inquiry >> Dec 07, 2017  3:37 PM Oliver Pila B wrote: Reason for CRM: pt called to tell Dr. Caryl Bis that bp is doing better, the highest was 157/87 and lowest 103/61; contact pt to advise

## 2017-12-07 NOTE — Telephone Encounter (Signed)
Glad to hear her blood pressure is doing better.  Please see if you can get a list of her blood pressures so I can determine if it is mostly under good control or if it is uncontrolled.  This would be helpful to determine if she needs medication.  Thanks.

## 2017-12-07 NOTE — Telephone Encounter (Signed)
fyi

## 2017-12-08 NOTE — Telephone Encounter (Signed)
Patient states she will drop off readings. Patient is scheduled with Dr.Tullo on 12/18/17.

## 2017-12-08 NOTE — Telephone Encounter (Signed)
Noted. Thanks.

## 2017-12-09 NOTE — Telephone Encounter (Signed)
Patient dropped off readings of her BP. Paper placed in providers box.

## 2017-12-09 NOTE — Telephone Encounter (Signed)
Placed in red folder  

## 2017-12-10 NOTE — Telephone Encounter (Signed)
Patient notified

## 2017-12-10 NOTE — Telephone Encounter (Signed)
Her blood pressure is adequately controlled with the exception of 2-3 times.  She should keep her appointment with Dr. Derrel Nip.  Thanks.

## 2017-12-11 ENCOUNTER — Ambulatory Visit: Payer: Medicare Other | Admitting: Internal Medicine

## 2017-12-18 ENCOUNTER — Encounter: Payer: Self-pay | Admitting: Internal Medicine

## 2017-12-18 ENCOUNTER — Ambulatory Visit (INDEPENDENT_AMBULATORY_CARE_PROVIDER_SITE_OTHER): Payer: Medicare Other | Admitting: Internal Medicine

## 2017-12-18 VITALS — BP 162/78 | HR 66 | Temp 97.8°F | Resp 15 | Ht 63.0 in | Wt 102.2 lb

## 2017-12-18 DIAGNOSIS — E782 Mixed hyperlipidemia: Secondary | ICD-10-CM | POA: Diagnosis not present

## 2017-12-18 DIAGNOSIS — R634 Abnormal weight loss: Secondary | ICD-10-CM | POA: Diagnosis not present

## 2017-12-18 DIAGNOSIS — R7303 Prediabetes: Secondary | ICD-10-CM

## 2017-12-18 DIAGNOSIS — E441 Mild protein-calorie malnutrition: Secondary | ICD-10-CM

## 2017-12-18 DIAGNOSIS — I150 Renovascular hypertension: Secondary | ICD-10-CM

## 2017-12-18 LAB — HEMOGLOBIN A1C
Hemoglobin A1C: 5.3
Hemoglobin A1C: 5.3

## 2017-12-18 MED ORDER — AMLODIPINE BESYLATE 2.5 MG PO TABS: 2.5000 mg | ORAL_TABLET | Freq: Every day | ORAL | 0 refills | Status: DC

## 2017-12-18 NOTE — Progress Notes (Addendum)
Subjective:  Patient ID: Hailey Hunt, female    DOB: 1946/08/31  Age: 72 y.o. MRN: [id number]  CC: The primary encounter diagnosis was Prediabetes. Diagnoses of Weight loss, Renovascular hypertension, Mild protein-calorie malnutrition ([place]), Mixed hyperlipidemia, and Loss of weight were also pertinent to this visit.  HPI SKYLA CHAMPAGNE presents for follow up on hypertension   Seen by ER on March 8  For new onset headache accompanied by elevated readings at home  Nothing started . Since then she has been checking her BP and readings have been as high as 423 systolic.  She has tolerated amlodipine in the past .   She has become concerned about her weight loss.  She has a history of prediabetes.  Her weight has been stable for the past year   Outpatient Medications Prior to Visit  Medication Sig Dispense Refill  . cholecalciferol (VITAMIN D) 1000 UNITS tablet Take 1,000 Units by mouth daily.    . cyanocobalamin (,VITAMIN B-12,) 1000 MCG/ML injection INJECT 1 ML IM EVERY MONTH 10 mL 3  . Cyanocobalamin (VITAMIN B 12 PO) Take by mouth.    . SYNTHROID 50 MCG tablet TAKE ONE TABLET DAILY ON EMPTY STOMACH WITH GLASS OF WATER AT LEAST 30-60 MINUTES BEFORE BREAKFAST 90 tablet 1  . Syringe/Needle, Disp, (SYRINGE 3CC/27GX1-1/4") 27G X 1-1/4" 3 ML MISC For B12 injections 100 each 0  . traMADol (ULTRAM) 50 MG tablet Take 1 tablet (50 mg total) by mouth every 8 (eight) hours as needed. 30 tablet 3  . tretinoin (RETIN-A) 0.025 % cream APPLY AT BEDTIME 45 g 1   Facility-Administered Medications Prior to Visit  Medication Dose Route Frequency Provider Last Rate Last Dose  . 0.9 %  sodium chloride infusion  500 mL Intravenous Continuous Pyrtle, Lajuan Lines, MD        Review of Systems;  Patient denies headache, fevers, malaise, unintentional weight loss, skin rash, eye pain, sinus congestion and sinus pain, sore throat, dysphagia,  hemoptysis , cough, dyspnea, wheezing, chest pain, palpitations,  orthopnea, edema, abdominal pain, nausea, melena, diarrhea, constipation, flank pain, dysuria, hematuria, urinary  Frequency, nocturia, numbness, tingling, seizures,  Focal weakness, Loss of consciousness,  Tremor, insomnia, depression, anxiety, and suicidal ideation.      Objective:  BP (!) 162/78 (BP Location: Left Arm, Patient Position: Sitting, Cuff Size: Normal)   Pulse 66   Temp 97.8 F (36.6 C) (Oral)   Resp 15   Ht 5\' 3"  (1.6 m)   Wt 102 lb 3.2 oz (46.4 kg)   SpO2 98%   BMI 18.10 kg/m   BP Readings from Last 3 Encounters:  12/18/17 (!) 162/78  12/04/17 140/88  10/09/17 (!) 110/58    Wt Readings from Last 3 Encounters:  12/18/17 102 lb 3.2 oz (46.4 kg)  12/04/17 105 lb (47.6 kg)  10/09/17 103 lb (46.7 kg)    General appearance: alert, cooperative and appears stated age Ears: normal TM's and external ear canals both ears Throat: lips, mucosa, and tongue normal; teeth and gums normal Neck: no adenopathy, no carotid bruit, supple, symmetrical, trachea midline and thyroid not enlarged, symmetric, no tenderness/mass/nodules Back: symmetric, no curvature. ROM normal. No CVA tenderness. Lungs: clear to auscultation bilaterally Heart: regular rate and rhythm, S1, S2 normal, no murmur, click, rub or gallop Abdomen: soft, non-tender; bowel sounds normal; no masses,  no organomegaly Pulses: 2+ and symmetric Skin: Skin color, texture, turgor normal. No rashes or lesions Lymph nodes: Cervical, supraclavicular, and axillary nodes normal.  Lab Results  Component Value Date   HGBA1C 5.3 12/21/2017   HGBA1C 5.9 12/20/2017   HGBA1C 5.3 12/19/2017   HGBA1C 5.3 12/19/2017    Lab Results  Component Value Date   CREATININE 0.99 12/04/2017   CREATININE 1.12 10/09/2017   CREATININE 0.99 07/13/2017    Lab Results  Component Value Date   WBC 4.7 10/09/2017   HGB 11.8 (L) 10/09/2017   HCT 36.5 10/09/2017   PLT 187.0 10/09/2017   GLUCOSE 99 12/04/2017   CHOL 196 07/13/2017    TRIG 85.0 07/13/2017   HDL 82.90 07/13/2017   LDLDIRECT 92.0 11/19/2015   LDLCALC 96 07/13/2017   ALT 11 12/04/2017   AST 20 12/04/2017   NA 141 12/04/2017   K 4.3 12/04/2017   CL 103 12/04/2017   CREATININE 0.99 12/04/2017   BUN 16 12/04/2017   CO2 28 12/04/2017   TSH 0.98 07/13/2017   HGBA1C 5.3 12/21/2017   MICROALBUR 0.2 09/07/2013    Ct Chest Wo Contrast  Result Date: 07/24/2017 CLINICAL DATA:  Follow-up pulmonary nodules. EXAM: CT CHEST WITHOUT CONTRAST TECHNIQUE: Multidetector CT imaging of the chest was performed following the standard protocol without IV contrast. COMPARISON:  02/12/2017 chest CT. FINDINGS: Cardiovascular: Normal heart size. No significant pericardial fluid/thickening. Mildly atherosclerotic nonaneurysmal thoracic aorta. Aberrant nonaneurysmal right subclavian artery arising from the distal aortic arch with retroesophageal course . Normal caliber pulmonary arteries. Mediastinum/Nodes: No discrete thyroid nodules. Unremarkable esophagus. No pathologically enlarged axillary, mediastinal or gross hilar lymph nodes, noting limited sensitivity for the detection of hilar adenopathy on this noncontrast study. Lungs/Pleura: No pneumothorax. No pleural effusion. Punctate 2 mm posterior right lower lobe granuloma is stable. Two solid left lower lobe pulmonary nodules are stable, largest 5 mm (series 3/ image 138). No additional significant pulmonary nodules. Previously described opacity in the inferior right middle lobe is stable and seen to represent a tiny thin parenchymal band on coronal sequence, compatible with postinfectious/ postinflammatory scar. No acute consolidative airspace disease or lung masses. Upper abdomen: Subcentimeter hypodense anterior left liver lobe lesion is too small to characterize and stable, considered benign. Musculoskeletal:  No aggressive appearing focal osseous lesions. IMPRESSION: 1. Interval stability of small solid pulmonary nodules in the left  lower lobe, considered benign. 2. No active cardiopulmonary disease. 3. Aberrant right subclavian artery. Aortic Atherosclerosis (ICD10-I70.0). Electronically Signed   By: Ilona Sorrel M.D.   On: 07/24/2017 11:12    Assessment & Plan:   Problem List Items Addressed This Visit    Loss of weight    Her weight has been stable for the past year.  She has prediabetes based on her a1c and feels hypoglycemic at times .  She was given a glucometer to check BS readings. Reviewed her protein needs and recommended a minimum  of 60 grams daily   Lab Results  Component Value Date   HGBA1C 5.9 07/13/2017   Lab Results  Component Value Date   ALT 11 12/04/2017   AST 20 12/04/2017   ALKPHOS 82 07/13/2017   BILITOT 0.4 12/04/2017   Lab Results  Component Value Date   TSH 0.98 07/13/2017         Hypertension    Starting amlodipine 2.5 mg daily .    .llastcr Lab Results  Component Value Date   NA 141 12/04/2017   K 4.3 12/04/2017   CL 103 12/04/2017   CO2 28 12/04/2017         Relevant Medications   amLODipine (NORVASC)  2.5 MG tablet   Other Relevant Orders   Comprehensive metabolic panel   Microalbumin / creatinine urine ratio   Prediabetes - Primary    Most recent a1c was normal at 5.3. NOT 5.9  Lab Results  Component Value Date   HGBA1C 5.3 12/21/2017            Relevant Orders   POCT HgB A1C (Completed)    Other Visit Diagnoses    Weight loss       Relevant Orders   TSH   Mild protein-calorie malnutrition ([place])       Relevant Orders   Prealbumin   Mixed hyperlipidemia       Relevant Medications   amLODipine (NORVASC) 2.5 MG tablet   Other Relevant Orders   Lipid panel      I am having Brinlynn B. Obenchain "Jocelyn Lamer" start on amLODipine. I am also having her maintain her SYRINGE 3CC/27GX1-1/4", cholecalciferol, Cyanocobalamin (VITAMIN B 12 PO), cyanocobalamin, tretinoin, traMADol, and SYNTHROID. We will continue to administer sodium chloride.  Meds ordered this  encounter  Medications  . amLODipine (NORVASC) 2.5 MG tablet    Sig: Take 1 tablet (2.5 mg total) by mouth daily.    Dispense:  90 tablet    Refill:  0    There are no discontinued medications.  Follow-up: Return in about 3 days (around 12/21/2017) for labs,  RN visit for BP machine calibration .   Crecencio Mc, MD

## 2017-12-18 NOTE — Patient Instructions (Addendum)
Resuming amlodipine 2.5 mg daily for blood pressure   We will check labs and bp next week    You need 60  Mg protein minimum daily

## 2017-12-19 LAB — HEMOGLOBIN A1C
Hemoglobin A1C: 5.3
Hemoglobin A1C: 5.3

## 2017-12-20 LAB — POCT GLYCOSYLATED HEMOGLOBIN (HGB A1C): Hemoglobin A1C: 5.9

## 2017-12-20 NOTE — Assessment & Plan Note (Signed)
Starting amlodipine 2.5 mg daily .    .llastcr Lab Results  Component Value Date   NA 141 12/04/2017   K 4.3 12/04/2017   CL 103 12/04/2017   CO2 28 12/04/2017

## 2017-12-20 NOTE — Assessment & Plan Note (Addendum)
Her weight has been stable for the past year.  She has prediabetes based on her a1c and feels hypoglycemic at times .  She was given a glucometer to check BS readings. Reviewed her protein needs and recommended a minimum  of 60 grams daily   Lab Results  Component Value Date   HGBA1C 5.9 07/13/2017   Lab Results  Component Value Date   ALT 11 12/04/2017   AST 20 12/04/2017   ALKPHOS 82 07/13/2017   BILITOT 0.4 12/04/2017   Lab Results  Component Value Date   TSH 0.98 07/13/2017

## 2017-12-21 LAB — HEMOGLOBIN A1C: Hemoglobin A1C: 5.3

## 2017-12-21 NOTE — Assessment & Plan Note (Addendum)
Most recent a1c was normal at 5.3. NOT 5.9  Lab Results  Component Value Date   HGBA1C 5.3 12/21/2017

## 2017-12-23 ENCOUNTER — Encounter: Payer: Self-pay | Admitting: *Deleted

## 2017-12-23 ENCOUNTER — Telehealth: Payer: Self-pay | Admitting: Internal Medicine

## 2017-12-23 ENCOUNTER — Ambulatory Visit (INDEPENDENT_AMBULATORY_CARE_PROVIDER_SITE_OTHER): Payer: Medicare Other | Admitting: *Deleted

## 2017-12-23 VITALS — BP 144/74 | HR 64 | Resp 18

## 2017-12-23 DIAGNOSIS — I10 Essential (primary) hypertension: Secondary | ICD-10-CM

## 2017-12-23 DIAGNOSIS — I150 Renovascular hypertension: Secondary | ICD-10-CM | POA: Diagnosis not present

## 2017-12-23 DIAGNOSIS — E782 Mixed hyperlipidemia: Secondary | ICD-10-CM

## 2017-12-23 DIAGNOSIS — E441 Mild protein-calorie malnutrition: Secondary | ICD-10-CM | POA: Diagnosis not present

## 2017-12-23 DIAGNOSIS — R634 Abnormal weight loss: Secondary | ICD-10-CM | POA: Diagnosis not present

## 2017-12-23 LAB — LIPID PANEL
Cholesterol: 193 mg/dL (ref 0–200)
HDL: 83.5 mg/dL (ref >39.00)
LDL Cholesterol: 99 mg/dL (ref 0–99)
NonHDL: 109.35
Total CHOL/HDL Ratio: 2
Triglycerides: 54 mg/dL (ref 0.0–149.0)
VLDL: 10.8 mg/dL (ref 0.0–40.0)

## 2017-12-23 LAB — MICROALBUMIN / CREATININE URINE RATIO
Creatinine,U: 42.9 mg/dL
Microalb Creat Ratio: 4.1 mg/g (ref 0.0–30.0)
Microalb, Ur: 1.8 mg/dL (ref 0.0–1.9)

## 2017-12-23 LAB — COMPREHENSIVE METABOLIC PANEL
ALT: 14 U/L (ref 0–35)
AST: 20 U/L (ref 0–37)
Albumin: 4.2 g/dL (ref 3.5–5.2)
Alkaline Phosphatase: 71 U/L (ref 39–117)
BUN: 18 mg/dL (ref 6–23)
CO2: 28 mEq/L (ref 19–32)
Calcium: 9.6 mg/dL (ref 8.4–10.5)
Chloride: 109 mEq/L (ref 96–112)
Creatinine, Ser: 0.79 mg/dL (ref 0.40–1.20)
GFR: 76.61 mL/min (ref >60.00)
Glucose, Bld: 103 mg/dL — ABNORMAL HIGH (ref 70–99)
Potassium: 3.9 mEq/L (ref 3.5–5.1)
Sodium: 138 mEq/L (ref 135–145)
Total Bilirubin: 0.7 mg/dL (ref 0.2–1.2)
Total Protein: 6.6 g/dL (ref 6.0–8.3)

## 2017-12-23 LAB — TSH: TSH: 1.15 u[IU]/mL (ref 0.35–4.50)

## 2017-12-23 NOTE — Telephone Encounter (Signed)
RN notes reviewed.  Please ask patient to increase amlodipine to 5 mg daily .

## 2017-12-23 NOTE — Telephone Encounter (Signed)
Patient notified and voiced understanding.

## 2017-12-23 NOTE — Progress Notes (Addendum)
Patient presented for FU from last appointment after restarting amlodipine 2.5 mg on 12/19/17. BP attained in left arm by patient first 144/74 pulse 64 waited 15 minutes checked with home auto cuff and same reading attained. After waiting additional time patient checked BP using manual cuff, examined manual before patient attempted BP and noticed gauge does not drop to position at bottom of gauge properly, advised patient home manual cuff may not be correct, patient took BP and found BP 152/74 and 8 point difference. Re- took BP office cuff BP 144/72 pulse 64.  Reviewed .  Ask patient to increase dose of amlodipine to 5 mg daily and obtain new home meter

## 2017-12-24 ENCOUNTER — Other Ambulatory Visit: Payer: Self-pay | Admitting: Internal Medicine

## 2017-12-24 DIAGNOSIS — I773 Arterial fibromuscular dysplasia: Secondary | ICD-10-CM

## 2017-12-24 LAB — PREALBUMIN: Prealbumin: 17 mg/dL (ref 17–34)

## 2017-12-24 MED ORDER — AMLODIPINE BESYLATE 5 MG PO TABS: 5.0000 mg | ORAL_TABLET | Freq: Every day | ORAL | Status: DC

## 2017-12-24 NOTE — Addendum Note (Signed)
Addended by: Crecencio Mc on: 12/24/2017 04:41 PM   Modules accepted: Orders

## 2018-01-20 DIAGNOSIS — I773 Arterial fibromuscular dysplasia: Secondary | ICD-10-CM | POA: Diagnosis not present

## 2018-01-20 DIAGNOSIS — Z681 Body mass index (BMI) 19 or less, adult: Secondary | ICD-10-CM | POA: Diagnosis not present

## 2018-01-20 DIAGNOSIS — I10 Essential (primary) hypertension: Secondary | ICD-10-CM | POA: Diagnosis not present

## 2018-01-21 ENCOUNTER — Telehealth: Payer: Self-pay

## 2018-01-21 ENCOUNTER — Ambulatory Visit (INDEPENDENT_AMBULATORY_CARE_PROVIDER_SITE_OTHER): Payer: Medicare Other | Admitting: Internal Medicine

## 2018-01-21 VITALS — BP 104/62 | HR 91 | Ht 63.25 in | Wt 100.6 lb

## 2018-01-21 DIAGNOSIS — E890 Postprocedural hypothyroidism: Secondary | ICD-10-CM | POA: Diagnosis not present

## 2018-01-21 DIAGNOSIS — M8889 Osteitis deformans of multiple sites: Secondary | ICD-10-CM | POA: Diagnosis not present

## 2018-01-21 DIAGNOSIS — R7303 Prediabetes: Secondary | ICD-10-CM | POA: Diagnosis not present

## 2018-01-21 DIAGNOSIS — M8580 Other specified disorders of bone density and structure, unspecified site: Secondary | ICD-10-CM

## 2018-01-21 NOTE — Progress Notes (Signed)
Patient ID: Hailey Hunt, female   DOB: [birth year], 72 y.o.   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  She also would like to discuss her latest HbA1c results.  Last visit 1 year ago.  Reviewed and addended history: She had a CTA abd/pelvis on 06/2015:incidental lesion:  Diffuse patchy sclerosis and cortical thickening of the left iliac bone, favored to represent Paget's disease.  She has had an anteverted L femur since her 24s. She has had pain in that his ever since.   Bone scan (01/02/2016):: FINDINGS: There is asymmetric increased uptake throughout the left iliac bone. The appearance and distribution is compatible with Paget's disease. Increased uptake in the distribution of the nasal bone is also noted, nonspecific. This could be seen with Paget's disease, fibrous dysplasia or chronic sinusitis. No findings identified to suggest bone metastases. Physiologic uptake is noted within the kidneys and urinary bladder.  IMPRESSION: 1. Increased uptake within the left iliac bone compatible with Paget's disease. 2. Central area of increased uptake in the region of the nasal bone which may also reflect Paget's disease of the skull.  I reviewed pt's DXA scan reports: Date L1-L4 T score FN T score  12/16/2016 ([facility]) -0.40 RFN:-2.2 LFN:-2.1  01/22/2012 ([facility])  -0.60 LFN: -0.66    She denies fractures (traumatic vb fracture as a child) or falls.  She had dizziness.  She has been on the following OP treatments:  - Actonel for 2 years. This was apparently stopped due to recurrent kidney stones -last episode 2010. She felt poorly on it.  Latest vitamin D level was normal. Lab Results  Component Value Date   VD25OH 55.42 07/13/2017   VD25OH 49.37 12/12/2016   VD25OH 49.43 11/19/2015   VD25OH 46.46 08/14/2014   VD25OH 62 08/29/2013   VD25OH 21 (L) 01/20/2012   She was on the high  dose vitamin D dose, currently on 1000 units a day.  She eats dairy and green leafy vegetables.  She walks for exercise.  Surgical menopause was at 72 y/o.   Pt does not have a FH of osteoporosis.  No history of hyper or hypocalcemia and no hyperparathyroidism. Lab Results  Component Value Date   CALCIUM 9.6 12/23/2017   CALCIUM 9.5 12/04/2017   CALCIUM 9.1 10/09/2017   CALCIUM 10.0 07/13/2017   CALCIUM 9.8 09/18/2016   CALCIUM 9.8 11/19/2015   CALCIUM 10.0 03/28/2015   CALCIUM 9.7 08/14/2014   CALCIUM 9.9 07/06/2014   CALCIUM 9.9 08/29/2013   She also has a history of Graves' disease, status post RAI treatment in the 75s. 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 Last BUN/Cr normal: Lab Results  Component Value Date   BUN 18 12/23/2017   CREATININE 0.79 12/23/2017   Last HbA1c: Lab Results  Component Value Date   HGBA1C 5.3 12/21/2017   HGBA1C 5.9 12/20/2017   HGBA1C 5.3 12/19/2017   HGBA1C 5.3 12/19/2017   HGBA1C 5.3 12/18/2017   HGBA1C 5.3 12/18/2017  HGBA1C 5.9 07/13/2017   HGBA1C 5.8 09/18/2016    ROS: Constitutional: + weight loss, no fatigue, no subjective hyperthermia, no subjective hypothermia, + nocturia Eyes: no blurry vision, no xerophthalmia ENT: no sore throat, no nodules palpated in throat, no dysphagia, no odynophagia, no hoarseness Cardiovascular: no CP/no SOB/+ palpitations/+ leg swelling Respiratory: no cough/no SOB/no wheezing Gastrointestinal: no N/no V/no D/no C/no acid reflux Musculoskeletal: + muscle aches/+ joint aches Skin: no rashes, no hair loss Neurological: no tremors/no  numbness/no tingling/no dizziness  I reviewed pt's medications, allergies, PMH, social hx, family hx, and changes were documented in the history of present illness. Otherwise, unchanged from my initial visit note.  She stopped losartan and started amlodipine.  Past Medical History:  Diagnosis Date  . Aberrant right subclavian artery   . Cataract    surgery  . Clotting disorder (HCC)    hx of bleeding after vaginal delivery, dr told her not to have any surgeries that are necessary  . Dysplasia, artery, fibromuscular, renal ([place])   . Fibromuscular dysplasia ([place])   . Fibromuscular dysplasia of renal artery (HCC)    managed by Dr. Gregary [facility]  . Graves disease   . Heart murmur   . Heart murmur   . Hypertension   . Kidney stone   . Migraines   . Neuropathy with IgA monoclonal gammopathy (HCC)    Dr Mervyn Skeeters  . Paget's disease of the bone   . Pernicious anemia   . Thyroid disease   . Urinary incontinence, mixed    resolved with removal of ained sutures in bladder    Past Surgical History:  Procedure Laterality Date  . CATARACT EXTRACTION  Sep 10, 2011, Jan 13,2013  . COLONOSCOPY    . RENAL BIOPSY    . TONSILLECTOMY    . VAGINAL DELIVERY     x2  . VAGINAL HYSTERECTOMY    . WISDOM TOOTH EXTRACTION     Social History   Socioeconomic History  . Marital status: Married    Spouse name: Not on file  . Number of children: 2  . Years of education: Not on file  . Highest education level: Not on file  Occupational History  . Occupation: Network engineer for [employer]  . Financial resource strain: Not on file  . Food insecurity:    Worry: Not on file    Inability: Not on file  . Transportation needs:    Medical: Not on file    Non-medical: Not on file  Tobacco Use  . Smoking status: Never Smoker  . Smokeless tobacco: Never Used  Substance and Sexual Activity  . Alcohol use: Yes    Comment: once a year  . Drug use: No  . Sexual activity: Not Currently   Lifestyle  . Physical activity:    Days per week: Not on file    Minutes per session: Not on file  . Stress: Not on file  Relationships  . Social connections:    Talks on phone: Not on file    Gets together: Not on file    Attends religious service: Not on file    Active member of club or organization: Not on file    Attends meetings of clubs or organizations: Not on file    Relationship status: Not on file  . Intimate partner violence:    Fear of current or ex partner: Not on file    Emotionally abused: Not on file    Physically abused: Not on  file    Forced sexual activity: Not on file  Other Topics Concern  . Not on file  Social History Narrative  . Not on file   Current Outpatient Medications on File Prior to Visit  Medication Sig Dispense Refill  . amLODipine (NORVASC) 5 MG tablet Take 1 tablet (5 mg total) by mouth daily.    . cholecalciferol (VITAMIN D) 1000 UNITS tablet Take 1,000 Units by mouth daily.    . cyanocobalamin (,VITAMIN B-12,) 1000 MCG/ML injection INJECT 1 ML IM EVERY MONTH 10 mL 3  . Cyanocobalamin (VITAMIN B 12 PO) Take by mouth.    . SYNTHROID 50 MCG tablet TAKE ONE TABLET DAILY ON EMPTY STOMACH WITH GLASS OF WATER AT LEAST 30-60 MINUTES BEFORE BREAKFAST 90 tablet 1  . Syringe/Needle, Disp, (SYRINGE 3CC/27GX1-1/4") 27G X 1-1/4" 3 ML MISC For B12 injections 100 each 0  . traMADol (ULTRAM) 50 MG tablet Take 1 tablet (50 mg total) by mouth every 8 (eight) hours as needed. 30 tablet 3  . tretinoin (RETIN-A) 0.025 % cream APPLY AT BEDTIME 45 g 1   Current Facility-Administered Medications on File Prior to Visit  Medication Dose Route Frequency Provider Last Rate Last Dose  . 0.9 %  sodium chloride infusion  500 mL Intravenous Continuous Pyrtle, Lajuan Lines, MD       Allergies  Allergen Reactions  . Voltaren [Diclofenac] Hypertension  . Adhesive [Tape]   . Demerol     HYPOTENSION  . Macrodantin   . Nsaids Hypertension  . Voltaren [Diclofenac Sodium] Other  (See Comments)    FMD, increased bp  . Codeine Nausea And Vomiting and Rash  . Nickel Rash  . Penicillins Rash  . Sulfa Antibiotics Rash    Fever   Family History  Problem Relation Age of Onset  . Anuerysm Mother   . COPD Mother   . Hypertension Mother   . Heart disease Mother   . Heart failure Mother   . Cancer Father        lung CA  . Heart disease Father   . Heart attack Father   . Heart disease Brother        viral cardiomyopathy, cocaine use  . Drug abuse Brother   . Cancer Maternal Aunt        esophageal and live rCA,  hisotory of  ETOH and tobacco  . Anuerysm Maternal Grandfather   . Cancer Paternal Grandmother        brain tumor  . Diabetes Sister   . Colon cancer Neg Hx    PE: BP 104/62 (BP Location: Left Arm, Patient Position: Sitting, Cuff Size: Normal)   Pulse 91   Wt 100 lb 9.6 oz (45.6 kg)   SpO2 97%   BMI 17.82 kg/m  Body mass index is 17.82 kg/m. Wt Readings from Last 3 Encounters:  01/21/18 100 lb 9.6 oz (45.6 kg)  12/18/17 102 lb 3.2 oz (46.4 kg)  12/04/17 105 lb (47.6 kg)   Constitutional: normal weight, in NAD Eyes: PERRLA, EOMI, no exophthalmos ENT: moist mucous membranes, no thyromegaly, no cervical lymphadenopathy Cardiovascular: RRR, No MRG Respiratory: CTA B Gastrointestinal: abdomen soft, NT, ND, BS+ Musculoskeletal: no deformities, strength intact in all 4 Skin: moist, warm, no rashes Neurological: no tremor with outstretched hands, DTR normal in all 4  Assessment: 1. Paget disease  2. Osteopenia  3. Postablative hypothyroidism - RAI tx for Graves ds. In 1980s  4.  Prediabetes  Plan: 1. Paget ds. -The lesion  in the left hip was observed incidentally on pelvic CTA at [facility].  This was then again noticed on the bone scan from 2017, along with a nasal focus. -Paget's disease lesions do not necessarily need to be treated if they are localized and made several criteria: No pain, no increased erythema-calor, no contiguity with a  joint, not in a weightbearing joint.  Severe complications: Bone cancer, fractures, heart failure are rare nowadays. -We again reviewed together her alkaline phosphatase levels, which have been normal, including in 11/2017. -Since no pain and warmth at the site of her Pagetoid bones, no intervention is needed for now -She would like to avoid bisphosphonates secondary to previous kidney stones.   - will see her back in 1 year  2. Osteopenia - She has a normal vitamin D level, on supplementation with 1000 units of vitamin D daily.  We will recheck this today. -Reviewed together the latest bone density results, which showed osteopenia at the level of the hips  3. Postablative hypothyroidism - latest thyroid labs reviewed with pt >> normal in 11/2017 - she continues on Synthroid 50 mcg daily - pt feels good on this dose. - we discussed about taking the thyroid hormone every day, with water, >30 minutes before breakfast, separated by >4 hours from acid reflux medications, calcium, iron, multivitamins. Pt. is taking it correctly.  4.  Prediabetes - I reassured the patient that on HbA1c of 5.9% is not alarming.   - We did discuss about improvement of her diet, as quite a few of her meals are on the go and are either protein bars or fast food.  Lab Results  Component Value Date   VD25OH 60.99 02/02/2018   Vitamin D level is excellent.  Continue current supplementation dose.  Philemon Kingdom, MD PhD [facility]

## 2018-01-21 NOTE — Telephone Encounter (Signed)
Copied from [place] [id number]. Topic: Appointment Scheduling - Scheduling Inquiry for Clinic >> Jan 21, 2018  2:49 PM Synthia Innocent wrote: Reason for CRM: Requesting to have labs down for [facility], BMP. Patient does have order. Please advise

## 2018-01-21 NOTE — Patient Instructions (Signed)
Please stop at the lab.  Please come back for a follow-up appointment in 1 year.  

## 2018-01-21 NOTE — Telephone Encounter (Signed)
Patient is wanting to have a BMP drawn for [facility].

## 2018-01-21 NOTE — Telephone Encounter (Signed)
Copied from [place] [phone]. Topic: Referral - Status >> Jan 21, 2018  2:52 PM Synthia Innocent wrote: Reason for CRM: Checking status of [place] w/ [facility], was ordered in March and has not heard anything

## 2018-01-22 NOTE — Telephone Encounter (Signed)
Is it okay to order? 

## 2018-01-25 ENCOUNTER — Encounter: Payer: Self-pay | Admitting: Internal Medicine

## 2018-01-25 DIAGNOSIS — I159 Secondary hypertension, unspecified: Secondary | ICD-10-CM

## 2018-01-25 MED ORDER — TELMISARTAN 20 MG PO TABS: 20.0000 mg | ORAL_TABLET | Freq: Every day | ORAL | 0 refills | Status: DC

## 2018-01-25 NOTE — Telephone Encounter (Signed)
Lab is ordered and pt has been scheduled for a lab appt. Pt is aware of appt date time.

## 2018-01-25 NOTE — Telephone Encounter (Signed)
yes

## 2018-01-26 ENCOUNTER — Encounter: Payer: Self-pay | Admitting: Internal Medicine

## 2018-01-26 MED ORDER — TELMISARTAN 20 MG PO TABS
10.0000 mg | ORAL_TABLET | Freq: Every day | ORAL | 0 refills | Status: DC
Start: 2018-01-26 — End: 2018-07-21

## 2018-01-29 ENCOUNTER — Ambulatory Visit (INDEPENDENT_AMBULATORY_CARE_PROVIDER_SITE_OTHER): Payer: Medicare Other

## 2018-01-29 DIAGNOSIS — I773 Arterial fibromuscular dysplasia: Secondary | ICD-10-CM

## 2018-02-02 ENCOUNTER — Other Ambulatory Visit (INDEPENDENT_AMBULATORY_CARE_PROVIDER_SITE_OTHER): Payer: Medicare Other

## 2018-02-02 ENCOUNTER — Encounter: Payer: Self-pay | Admitting: Internal Medicine

## 2018-02-02 DIAGNOSIS — M8580 Other specified disorders of bone density and structure, unspecified site: Secondary | ICD-10-CM

## 2018-02-02 DIAGNOSIS — I159 Secondary hypertension, unspecified: Secondary | ICD-10-CM | POA: Diagnosis not present

## 2018-02-02 LAB — BASIC METABOLIC PANEL
BUN: 34 mg/dL — ABNORMAL HIGH (ref 6–23)
CO2: 28 mEq/L (ref 19–32)
Calcium: 9.7 mg/dL (ref 8.4–10.5)
Chloride: 107 mEq/L (ref 96–112)
Creatinine, Ser: 0.85 mg/dL (ref 0.40–1.20)
GFR: 70.38 mL/min (ref >60.00)
Glucose, Bld: 93 mg/dL (ref 70–99)
Potassium: 4.1 mEq/L (ref 3.5–5.1)
Sodium: 144 mEq/L (ref 135–145)

## 2018-02-02 LAB — VITAMIN D 25 HYDROXY (VIT D DEFICIENCY, FRACTURES): VITD: 60.99 ng/mL (ref 30.00–100.00)

## 2018-02-04 ENCOUNTER — Encounter: Payer: Self-pay | Admitting: Internal Medicine

## 2018-02-17 ENCOUNTER — Telehealth: Payer: Self-pay

## 2018-02-17 NOTE — Telephone Encounter (Signed)
Copied from [place] [phone]. Topic: General - Other >> Feb 17, 2018  9:37 AM Yvette Rack wrote: Reason for CRM: Pt states the telmisartan (MICARDIS) 20 MG tablet is non-formulary and not covered by insurance. Pt request plan formulary so she is able to get medication.

## 2018-02-19 NOTE — Telephone Encounter (Signed)
I cannot tell which ARB is on her formulary , so if she wants to review whether valsartan is covered,  We can switch otherwise I would continue telmisartan

## 2018-02-19 NOTE — Telephone Encounter (Signed)
Patient has decided just to pay out of pocket for the Telmisartan 20 mg. FYI

## 2018-02-19 NOTE — Telephone Encounter (Signed)
Talked with patient she says her blood pressure is doing well with splitting the 20 mg of Micardis if PCP feels best to stay on this medication and not try something that's on her formulary she will buy the Micardis at $23:00 a bottle.

## 2018-03-02 ENCOUNTER — Other Ambulatory Visit: Payer: Self-pay

## 2018-03-02 MED ORDER — TRETINOIN 0.025 % EX CREA: TOPICAL_CREAM | Freq: Every day | CUTANEOUS | 1 refills | Status: DC

## 2018-03-02 NOTE — Telephone Encounter (Signed)
Refilled: 03/30/2017 Last OV: 12/18/2017 Next OV: 05/10/2018

## 2018-03-10 DIAGNOSIS — Z1231 Encounter for screening mammogram for malignant neoplasm of breast: Secondary | ICD-10-CM | POA: Diagnosis not present

## 2018-03-10 LAB — HM MAMMOGRAPHY

## 2018-03-15 ENCOUNTER — Other Ambulatory Visit: Payer: Self-pay | Admitting: Internal Medicine

## 2018-03-25 DIAGNOSIS — H04123 Dry eye syndrome of bilateral lacrimal glands: Secondary | ICD-10-CM | POA: Diagnosis not present

## 2018-03-25 DIAGNOSIS — H268 Other specified cataract: Secondary | ICD-10-CM | POA: Diagnosis not present

## 2018-03-25 DIAGNOSIS — H40013 Open angle with borderline findings, low risk, bilateral: Secondary | ICD-10-CM | POA: Diagnosis not present

## 2018-04-09 ENCOUNTER — Encounter: Payer: Self-pay | Admitting: Internal Medicine

## 2018-04-28 ENCOUNTER — Other Ambulatory Visit: Payer: Self-pay

## 2018-05-10 ENCOUNTER — Ambulatory Visit (INDEPENDENT_AMBULATORY_CARE_PROVIDER_SITE_OTHER): Payer: Medicare Other

## 2018-05-10 ENCOUNTER — Encounter: Payer: Self-pay | Admitting: Internal Medicine

## 2018-05-10 ENCOUNTER — Ambulatory Visit (INDEPENDENT_AMBULATORY_CARE_PROVIDER_SITE_OTHER): Payer: Medicare Other | Admitting: Internal Medicine

## 2018-05-10 VITALS — BP 102/62 | HR 62 | Temp 98.0°F | Resp 15 | Ht 63.0 in | Wt 98.4 lb

## 2018-05-10 VITALS — BP 102/62 | HR 62 | Temp 98.0°F | Resp 12 | Ht 63.0 in | Wt 98.4 lb

## 2018-05-10 DIAGNOSIS — R05 Cough: Secondary | ICD-10-CM

## 2018-05-10 DIAGNOSIS — E890 Postprocedural hypothyroidism: Secondary | ICD-10-CM | POA: Diagnosis not present

## 2018-05-10 DIAGNOSIS — R059 Cough, unspecified: Secondary | ICD-10-CM

## 2018-05-10 DIAGNOSIS — R634 Abnormal weight loss: Secondary | ICD-10-CM

## 2018-05-10 DIAGNOSIS — R7301 Impaired fasting glucose: Secondary | ICD-10-CM | POA: Diagnosis not present

## 2018-05-10 DIAGNOSIS — I150 Renovascular hypertension: Secondary | ICD-10-CM | POA: Diagnosis not present

## 2018-05-10 DIAGNOSIS — R7303 Prediabetes: Secondary | ICD-10-CM

## 2018-05-10 DIAGNOSIS — Z0000 Encounter for general adult medical examination without abnormal findings: Secondary | ICD-10-CM

## 2018-05-10 LAB — COMPREHENSIVE METABOLIC PANEL
ALT: 13 U/L (ref 0–35)
AST: 18 U/L (ref 0–37)
Albumin: 4.4 g/dL (ref 3.5–5.2)
Alkaline Phosphatase: 78 U/L (ref 39–117)
BUN: 25 mg/dL — ABNORMAL HIGH (ref 6–23)
CO2: 29 mEq/L (ref 19–32)
Calcium: 10.2 mg/dL (ref 8.4–10.5)
Chloride: 104 mEq/L (ref 96–112)
Creatinine, Ser: 1.06 mg/dL (ref 0.40–1.20)
GFR: 54.51 mL/min — ABNORMAL LOW (ref >60.00)
Glucose, Bld: 95 mg/dL (ref 70–99)
Potassium: 4.2 mEq/L (ref 3.5–5.1)
Sodium: 140 mEq/L (ref 135–145)
Total Bilirubin: 0.9 mg/dL (ref 0.2–1.2)
Total Protein: 6.9 g/dL (ref 6.0–8.3)

## 2018-05-10 LAB — TSH: TSH: 2.05 u[IU]/mL (ref 0.35–4.50)

## 2018-05-10 LAB — HEMOGLOBIN A1C: Hgb A1c MFr Bld: 6.2 % (ref 4.6–6.5)

## 2018-05-10 MED ORDER — IPRATROPIUM BROMIDE 0.03 % NA SOLN: 2.0000 | Freq: Two times a day (BID) | NASAL | 12 refills | Status: DC

## 2018-05-10 NOTE — Patient Instructions (Addendum)
  Hailey Hunt , Thank you for taking time to come for your Medicare Wellness Visit. I appreciate your ongoing commitment to your health goals. Please review the following plan we discussed and let me know if I can assist you in the future.   These are the goals we discussed: Goals      Patient Stated   . Healthy Lifestyle (pt-stated)     Glycemic index diet Slow down to better focus and stay on task without distractions       This is a list of the screening recommended for you and due dates:  Health Maintenance  Topic Date Due  . Flu Shot  04/29/2018  . Mammogram  03/11/2019  . Tetanus Vaccine  07/15/2023  . Colon Cancer Screening  05/26/2026  . DEXA scan (bone density measurement)  Completed  .  Hepatitis C: One time screening is recommended by Center for Disease Control  (CDC) for  adults born from [birth year] through 1963.   Completed  . Pneumonia vaccines  Completed

## 2018-05-10 NOTE — Progress Notes (Signed)
Subjective:  Patient ID: Hailey Hunt, female    DOB: 06-14-1946  Age: 72 y.o. MRN: [id number]  CC: The primary encounter diagnosis was Recent unexplained weight loss. Diagnoses of Impaired fasting glucose, Cough in adult patient, Renovascular hypertension, Prediabetes, and Hypothyroidism following radioiodine therapy were also pertinent to this visit.  HPI [name] presents for follow up on secondary hypertension secondary to FMD  Taking telmisartan 1/2 tablet daily.. BP still dropping to 90/58 at times   Following a low carb diet due to prediabetic stage. Wt loss to 95 lbs with no change in appetite.  Thyroid level was normal in March.  Has gaineed 3 lbs in the last several weeks. [name] the Atkins shakes .  Snacking on chips made from egg whites from "[employer]"   Lab Results  Component Value Date   HGBA1C 6.2 05/10/2018    Up to date on screening  3d in June at [place].   Increased risk for breast cancer due to RAI 131   Dry cough persistnet  Waking he rup at night   Dry  Eyes on Xidra  Lab Results  Component Value Date   TSH 2.05 05/10/2018        Outpatient Medications Prior to Visit  Medication Sig Dispense Refill  . cholecalciferol (VITAMIN D) 1000 UNITS tablet Take 1,000 Units by mouth daily.    . cyanocobalamin (,VITAMIN B-12,) 1000 MCG/ML injection INJECT 1 ML IM EVERY MONTH 10 mL 3  . Cyanocobalamin (VITAMIN B 12 PO) Take by mouth.    Marland Kitchen Lifitegrast (XIIDRA) 5 % SOLN Apply 1 drop to eye 2 (two) times daily.    Marland Kitchen SYNTHROID 50 MCG tablet TAKE 1 TABLET EVERY DAY ON EMPTY STOMACHWITH A GLASS OF WATER AT LEAST 30-60 MINBEFORE BREAKFAST 90 tablet 0  . Syringe/Needle, Disp, (SYRINGE 3CC/27GX1-1/4") 27G X 1-1/4" 3 ML MISC For B12 injections 100 each 0  . telmisartan (MICARDIS) 20 MG tablet Take 0.5 tablets (10 mg total) by mouth daily. 45 tablet 0  . traMADol (ULTRAM) 50 MG tablet Take 1 tablet (50 mg total) by mouth every 8 (eight) hours as needed. 30 tablet  3  . tretinoin (RETIN-A) 0.025 % cream Apply topically at bedtime. 45 g 1  . amLODipine (NORVASC) 5 MG tablet Take 1 tablet (5 mg total) by mouth daily.     Facility-Administered Medications Prior to Visit  Medication Dose Route Frequency Provider Last Rate Last Dose  . 0.9 %  sodium chloride infusion  500 mL Intravenous Continuous Pyrtle, Lajuan Lines, MD        Review of Systems;  Patient denies headache, fevers, malaise, unintentional weight loss, skin rash, eye pain, sinus congestion and sinus pain, sore throat, dysphagia,  hemoptysis , cough, dyspnea, wheezing, chest pain, palpitations, orthopnea, edema, abdominal pain, nausea, melena, diarrhea, constipation, flank pain, dysuria, hematuria, urinary  Frequency, nocturia, numbness, tingling, seizures,  Focal weakness, Loss of consciousness,  Tremor, insomnia, depression, anxiety, and suicidal ideation.      Objective:  BP 102/62 (BP Location: Left Arm, Patient Position: Sitting, Cuff Size: Normal)   Pulse 62   Temp 98 F (36.7 C) (Oral)   Resp 15   Ht 5\' 3"  (1.6 m)   Wt 98 lb 6.4 oz (44.6 kg)   SpO2 98%   BMI 17.43 kg/m   BP Readings from Last 3 Encounters:  05/10/18 102/62  05/10/18 102/62  01/21/18 104/62    Wt Readings from Last 3 Encounters:  05/10/18  98 lb 6.4 oz (44.6 kg)  05/10/18 98 lb 6.4 oz (44.6 kg)  01/21/18 100 lb 9.6 oz (45.6 kg)    General appearance: alert, cooperative and appears stated age Ears: normal TM's and external ear canals both ears Throat: lips, mucosa, and tongue normal; teeth and gums normal Neck: no adenopathy, no carotid bruit, supple, symmetrical, trachea midline and thyroid not enlarged, symmetric, no tenderness/mass/nodules Back: symmetric, no curvature. ROM normal. No CVA tenderness. Lungs: clear to auscultation bilaterally Heart: regular rate and rhythm, S1, S2 normal, no murmur, click, rub or gallop Abdomen: soft, non-tender; bowel sounds normal; no masses,  no organomegaly Pulses: 2+  and symmetric Skin: Skin color, texture, turgor normal. No rashes or lesions Lymph nodes: Cervical, supraclavicular, and axillary nodes normal.  Lab Results  Component Value Date   HGBA1C 6.2 05/10/2018   HGBA1C 5.3 12/21/2017   HGBA1C 5.9 12/20/2017    Lab Results  Component Value Date   CREATININE 1.06 05/10/2018   CREATININE 0.85 02/02/2018   CREATININE 0.79 12/23/2017    Lab Results  Component Value Date   WBC 4.7 10/09/2017   HGB 11.8 (L) 10/09/2017   HCT 36.5 10/09/2017   PLT 187.0 10/09/2017   GLUCOSE 95 05/10/2018   CHOL 193 12/23/2017   TRIG 54.0 12/23/2017   HDL 83.50 12/23/2017   LDLDIRECT 92.0 11/19/2015   LDLCALC 99 12/23/2017   ALT 13 05/10/2018   AST 18 05/10/2018   NA 140 05/10/2018   K 4.2 05/10/2018   CL 104 05/10/2018   CREATININE 1.06 05/10/2018   BUN 25 (H) 05/10/2018   CO2 29 05/10/2018   TSH 2.05 05/10/2018   HGBA1C 6.2 05/10/2018   MICROALBUR 1.8 12/23/2017    Ct Chest Wo Contrast  Result Date: 07/24/2017 CLINICAL DATA:  Follow-up pulmonary nodules. EXAM: CT CHEST WITHOUT CONTRAST TECHNIQUE: Multidetector CT imaging of the chest was performed following the standard protocol without IV contrast. COMPARISON:  02/12/2017 chest CT. FINDINGS: Cardiovascular: Normal heart size. No significant pericardial fluid/thickening. Mildly atherosclerotic nonaneurysmal thoracic aorta. Aberrant nonaneurysmal right subclavian artery arising from the distal aortic arch with retroesophageal course . Normal caliber pulmonary arteries. Mediastinum/Nodes: No discrete thyroid nodules. Unremarkable esophagus. No pathologically enlarged axillary, mediastinal or gross hilar lymph nodes, noting limited sensitivity for the detection of hilar adenopathy on this noncontrast study. Lungs/Pleura: No pneumothorax. No pleural effusion. Punctate 2 mm posterior right lower lobe granuloma is stable. Two solid left lower lobe pulmonary nodules are stable, largest 5 mm (series 3/ image  138). No additional significant pulmonary nodules. Previously described opacity in the inferior right middle lobe is stable and seen to represent a tiny thin parenchymal band on coronal sequence, compatible with postinfectious/ postinflammatory scar. No acute consolidative airspace disease or lung masses. Upper abdomen: Subcentimeter hypodense anterior left liver lobe lesion is too small to characterize and stable, considered benign. Musculoskeletal:  No aggressive appearing focal osseous lesions. IMPRESSION: 1. Interval stability of small solid pulmonary nodules in the left lower lobe, considered benign. 2. No active cardiopulmonary disease. 3. Aberrant right subclavian artery. Aortic Atherosclerosis (ICD10-I70.0). Electronically Signed   By: Ilona Sorrel M.D.   On: 07/24/2017 11:12    Assessment & Plan:   Problem List Items Addressed This Visit    Hypertension    Well controlled on low dose telmisartan  Renal function stable, no changes today.  Lab Results  Component Value Date   CREATININE 1.06 05/10/2018   Lab Results  Component Value Date   NA 140  05/10/2018   K 4.2 05/10/2018   CL 104 05/10/2018   CO2 29 05/10/2018         Hypothyroidism following radioiodine therapy    Thyroid function is WNL on current dose.  No current changes needed.   Lab Results  Component Value Date   TSH 2.05 05/10/2018         Prediabetes    A1c has risen in spite of carbohydrate restriction , but her fasting glucose is normal . Continue mediterranean diet   Lab Results  Component Value Date   HGBA1C 6.2 05/10/2018             Other Visit Diagnoses    Recent unexplained weight loss    -  Primary   Relevant Orders   TSH (Completed)   Comprehensive metabolic panel (Completed)   Impaired fasting glucose       Relevant Orders   Hemoglobin A1c (Completed)   Cough in adult patient       Relevant Orders   DG Chest 2 View (Completed)      I have discontinued Noora B. Tello  "[name]"'s amLODipine. I am also having her maintain her SYRINGE 3CC/27GX1-1/4", cholecalciferol, Cyanocobalamin (VITAMIN B 12 PO), cyanocobalamin, traMADol, telmisartan, tretinoin, SYNTHROID, and Lifitegrast. We will continue to administer sodium chloride.  No orders of the defined types were placed in this encounter.   Medications Discontinued During This Encounter  Medication Reason  . amLODipine (NORVASC) 5 MG tablet Change in therapy    Follow-up: No follow-ups on file.   Crecencio Mc, MD

## 2018-05-10 NOTE — Progress Notes (Unsigned)
[facility]

## 2018-05-10 NOTE — Progress Notes (Addendum)
Subjective:   Hailey Hunt is a 72 y.o. female who presents for Medicare Annual (Subsequent) preventive examination.  Review of Systems:  No ROS.  Medicare Wellness Visit. Additional risk factors are reflected in the social history.  Cardiac Risk Factors include: advanced age (>89men, >21 women);hypertension     Objective:     Vitals: BP 102/62 (BP Location: Left Arm, Patient Position: Sitting, Cuff Size: Normal)   Pulse 62   Temp 98 F (36.7 C) (Oral)   Resp 12   Ht 5\' 3"  (1.6 m)   Wt 98 lb 6.4 oz (44.6 kg)   SpO2 98%   BMI 17.43 kg/m   Body mass index is 17.43 kg/m.  Advanced Directives 05/10/2018 12/17/2016 05/26/2016  Does Patient Have a Medical Advance Directive? Yes Yes Yes  Type of Paramedic of [place];Living will [place];Living will [place];Living will  Does patient want to make changes to medical advance directive? No - Patient declined No - Patient declined -  Copy of [place] in Chart? No - copy requested No - copy requested -    Tobacco Social History   Tobacco Use  Smoking Status Never Smoker  Smokeless Tobacco Never Used     Counseling given: Not Answered   Clinical Intake:  Pre-visit preparation completed: Yes  Pain : 0-10 Pain Score: 3  Pain Type: Chronic pain Pain Location: Head Pain Orientation: (Head ache) Pain Relieving Factors: Tylenol or Tramadol depending on the severity. Effect of Pain on Daily Activities: Rests between activities.  Pain Relieving Factors: Tylenol or Tramadol depending on the severity.  Nutritional Status: BMI <19  Underweight Diabetes: No  How often do you need to have someone help you when you read instructions, pamphlets, or other written materials from your doctor or pharmacy?: 1 - Never  Interpreter Needed?: No     Past Medical History:  Diagnosis Date  . Aberrant right subclavian artery   . Cataract    surgery    . Clotting disorder (HCC)    hx of bleeding after vaginal delivery, dr told her not to have any surgeries that are necessary  . Dysplasia, artery, fibromuscular, renal ([place])   . Fibromuscular dysplasia ([place])   . Fibromuscular dysplasia of renal artery (HCC)    managed by Dr. Gregary Signs [facility]  . Graves disease   . Heart murmur   . Heart murmur   . Hypertension   . Kidney stone   . Migraines   . Neuropathy with IgA monoclonal gammopathy (HCC)    Dr Mervyn Skeeters  . Paget's disease of the bone   . Pernicious anemia   . Thyroid disease   . Urinary incontinence, mixed    resolved with removal of ained sutures in bladder    Past Surgical History:  Procedure Laterality Date  . BILATERAL SALPINGOOPHORECTOMY    . CATARACT EXTRACTION  Sep 10, 2011, Jan 13,2013  . COLONOSCOPY    . RENAL BIOPSY    . TONSILLECTOMY    . VAGINAL DELIVERY     x2  . VAGINAL HYSTERECTOMY    . WISDOM TOOTH EXTRACTION     Family History  Problem Relation Age of Onset  . Anuerysm Mother   . COPD Mother   . Hypertension Mother   . Heart disease Mother   . Heart failure Mother   . Cancer Father        lung CA  . Heart disease Father   .  Heart attack Father   . Heart disease Brother        viral cardiomyopathy, cocaine use  . Drug abuse Brother   . Cancer Maternal Aunt        esophageal and live rCA,  hisotory of  ETOH and tobacco  . Anuerysm Maternal Grandfather   . Cancer Paternal Grandmother        brain tumor  . Diabetes Sister   . Colon cancer Paternal Uncle    Social History   Socioeconomic History  . Marital status: Married    Spouse name: Not on file  . Number of children: 2  . Years of education: Not on file  . Highest education level: Not on file  Occupational History  . Occupation: Network engineer for [employer]  . Financial resource strain: Not hard at all  . Food insecurity:    Worry: Never true    Inability: Never true  . Transportation needs:    Medical: No     Non-medical: No  Tobacco Use  . Smoking status: Never Smoker  . Smokeless tobacco: Never Used  Substance and Sexual Activity  . Alcohol use: Yes    Comment: once a year  . Drug use: No  . Sexual activity: Not Currently  Lifestyle  . Physical activity:    Days per week: Not on file    Minutes per session: Not on file  . Stress: Not on file  Relationships  . Social connections:    Talks on phone: Not on file    Gets together: Not on file    Attends religious service: Not on file    Active member of club or organization: Not on file    Attends meetings of clubs or organizations: Not on file    Relationship status: Not on file  Other Topics Concern  . Not on file  Social History Narrative  . Not on file    Outpatient Encounter Medications as of 05/10/2018  Medication Sig  . cholecalciferol (VITAMIN D) 1000 UNITS tablet Take 1,000 Units by mouth daily.  . cyanocobalamin (,VITAMIN B-12,) 1000 MCG/ML injection INJECT 1 ML IM EVERY MONTH  . Cyanocobalamin (VITAMIN B 12 PO) Take by mouth.  Marland Kitchen Lifitegrast (XIIDRA) 5 % SOLN Apply 1 drop to eye 2 (two) times daily.  Marland Kitchen SYNTHROID 50 MCG tablet TAKE 1 TABLET EVERY DAY ON EMPTY STOMACHWITH A GLASS OF WATER AT LEAST 30-60 MINBEFORE BREAKFAST  . Syringe/Needle, Disp, (SYRINGE 3CC/27GX1-1/4") 27G X 1-1/4" 3 ML MISC For B12 injections  . telmisartan (MICARDIS) 20 MG tablet Take 0.5 tablets (10 mg total) by mouth daily.  . traMADol (ULTRAM) 50 MG tablet Take 1 tablet (50 mg total) by mouth every 8 (eight) hours as needed.  . tretinoin (RETIN-A) 0.025 % cream Apply topically at bedtime.  . [DISCONTINUED] amLODipine (NORVASC) 5 MG tablet Take 1 tablet (5 mg total) by mouth daily.   Facility-Administered Encounter Medications as of 05/10/2018  Medication  . 0.9 %  sodium chloride infusion    Activities of Daily Living In your present state of health, do you have any difficulty performing the following activities: 05/10/2018  Hearing? N  Vision?  N  Difficulty concentrating or making decisions? Y  Comment She notices difficulty focusing on day to day tasks without getting distracted. Plans to monitor and will follow up with pcp if worsens.  Walking or climbing stairs? N  Dressing or bathing? N  Doing errands, shopping? N  Conservation officer, nature and  eating ? N  Using the Toilet? N  In the past six months, have you accidently leaked urine? N  Do you have problems with loss of bowel control? N  Managing your Medications? N  Managing your Finances? N  Housekeeping or managing your Housekeeping? N  Some recent data might be hidden    Patient Care Team: Crecencio Mc, MD as PCP - General (Internal Medicine) Kiser, Kerrie [place], MD (Nephrology) Minna Merritts, MD as Consulting Physician (Cardiology)    Assessment:   This is a routine wellness examination for Yolanda.  The goal of the wellness visit is to assist the patient how to close the gaps in care and create a preventative care plan for the patient.   The roster of all physicians providing medical care to patient is listed in the Snapshot section of the chart.  Taking calcium VIT D as appropriate/Osteoporosis risk reviewed.    Safety issues reviewed; Smoke and carbon monoxide detectors in the home. No firearms in the home. Wears seatbelts when driving or riding with others. No violence in the home.  They do not have excessive sun exposure.  Discussed the need for sun protection: hats, long sleeves and the use of sunscreen if there is significant sun exposure.  Patient is alert, normal appearance, oriented to person/place/and time. Correctly identified the president of the [place] and recalls of 3/3 words.Performs simple calculations and can read correct time from watch face. Displays appropriate judgement.  No new identified risk were noted.  No failures at ADL's or IADL's.    BMI- discussed the importance of a healthy diet, water intake and the benefits of aerobic exercise.    24 hour diet recall: Atkins protein supplemental drink Atkins protein bars for snack Glycemic index diet  Dental- every 6 months.  Sleep patterns- Reports she does not rest well and has not for years.  She has a daughter who sometimes has seizures at night time and it has become difficult for her to rest soundly for more than 30 years.   Health maintenance gaps- closed.  Patient Concerns: None at this time. Follow up with PCP as needed.  Exercise Activities and Dietary recommendations Current Exercise Habits: Home exercise routine, Type of exercise: walking, Time (Minutes): 30, Frequency (Times/Week): 7, Weekly Exercise (Minutes/Week): 210, Intensity: Moderate  Goals      Patient Stated   . Healthy Lifestyle (pt-stated)     Glycemic index diet Slow down to better focus and stay on task without distractions       Fall Risk Fall Risk  05/10/2018 12/17/2016 03/19/2016 12/13/2014 08/29/2013  Falls in the past year? No Yes Yes No No  Number falls in past yr: - 1 1 - -  Injury with Fall? - No Yes - -  Follow up - Falls prevention discussed - - -   Depression Screen PHQ 2/9 Scores 05/10/2018 12/17/2016 03/19/2016 12/13/2014  PHQ - 2 Score 0 0 0 0     Cognitive Function MMSE - Mini Mental State Exam 05/10/2018 12/17/2016  Orientation to time 5 5  Orientation to Place 5 5  Registration 3 3  Attention/ Calculation 5 5  Recall 3 3  Language- name 2 objects 2 2  Language- repeat 1 1  Language- follow 3 step command 3 3  Language- read & follow direction 1 1  Write a sentence 1 1  Copy design 1 1  Total score 30 30        Immunization History  Administered Date(s) Administered  . Influenza Nasal 07/09/2017  . Influenza Split 07/13/2012, 07/13/2014  . Influenza-Unspecified 07/04/2013, 06/30/2015, 07/14/2016  . PPD Test 11/19/2015  . Pneumococcal Conjugate-13 07/28/2013  . Pneumococcal Polysaccharide-23 07/07/2003, 07/06/2014  . Tdap 07/14/2013  . Zoster 09/14/2008  .  Zoster Recombinat (Shingrix) 04/10/2018   Screening Tests Health Maintenance  Topic Date Due  . INFLUENZA VACCINE  04/29/2018  . MAMMOGRAM  03/11/2019  . TETANUS/TDAP  07/15/2023  . COLONOSCOPY  05/26/2026  . DEXA SCAN  Completed  . Hepatitis C Screening  Completed  . PNA vac Low Risk Adult  Completed      Plan:    End of life planning; Advance aging; Advanced directives discussed. Copy of current HCPOA/Living Will requested.    I have personally reviewed and noted the following in the patient's chart:   . Medical and social history . Use of alcohol, tobacco or illicit drugs  . Current medications and supplements . Functional ability and status . Nutritional status . Physical activity . Advanced directives . List of other physicians . Hospitalizations, surgeries, and ER visits in previous 12 months . Vitals . Screenings to include cognitive, depression, and falls . Referrals and appointments  In addition, I have reviewed and discussed with patient certain preventive protocols, quality metrics, and best practice recommendations. A written personalized care plan for preventive services as well as general preventive health recommendations were provided to patient.     OBrien-Blaney, Qais Jowers L, LPN  [id number]    I have reviewed the above information and agree with above.   Deborra Medina, MD

## 2018-05-10 NOTE — Patient Instructions (Addendum)
[email or web address]  ( I don't check it every day)     To make a low carb chip :  Take the Joseph's Lavash or Pita bread,  Or the [place] Low carb whole wheat tortilla   Place on metal cookie sheet  Brush with olive oil  Sprinkle garlic powder (NOT garlic salt), grated parmesan cheese, mediterranean seasoning , or all of them?  Bake at 275 for 30 minutes   We have substitutions for your potatoes!!  Try the mashed cauliflower and riced cauliflower dishes instead of rice and mashed potatoes  Mashed turnips are also very low carb!   For desserts :  Try the Dannon Lt n Fit greek yogurt dessert flavors and top with reddi Whip .  8 carbs,  80 calories  Try Oikos Triple Zero [place] Yogurt in the salted caramel, and the coffee flavors  With Whipped Cream for dessert  breyer's low carb ice cream, available in bars (on a stick, better ) or scoopable ice cream  HERE ARE THE LOW CARB  BREAD CHOICES

## 2018-05-11 NOTE — Assessment & Plan Note (Signed)
A1c has risen in spite of carbohydrate restriction , but her fasting glucose is normal . Continue mediterranean diet   Lab Results  Component Value Date   HGBA1C 6.2 05/10/2018

## 2018-05-11 NOTE — Assessment & Plan Note (Signed)
Thyroid function is WNL on current dose.  No current changes needed.   Lab Results  Component Value Date   TSH 2.05 05/10/2018

## 2018-05-11 NOTE — Assessment & Plan Note (Signed)
Well controlled on low dose telmisartan  Renal function stable, no changes today.  Lab Results  Component Value Date   CREATININE 1.06 05/10/2018   Lab Results  Component Value Date   NA 140 05/10/2018   K 4.2 05/10/2018   CL 104 05/10/2018   CO2 29 05/10/2018

## 2018-06-16 ENCOUNTER — Other Ambulatory Visit: Payer: Self-pay | Admitting: Internal Medicine

## 2018-06-16 ENCOUNTER — Encounter: Payer: Self-pay | Admitting: Internal Medicine

## 2018-06-16 ENCOUNTER — Ambulatory Visit (INDEPENDENT_AMBULATORY_CARE_PROVIDER_SITE_OTHER): Payer: Medicare Other | Admitting: Internal Medicine

## 2018-06-16 VITALS — BP 92/52 | HR 88 | Temp 98.0°F | Ht 63.0 in | Wt 94.6 lb

## 2018-06-16 DIAGNOSIS — R7303 Prediabetes: Secondary | ICD-10-CM | POA: Diagnosis not present

## 2018-06-16 DIAGNOSIS — J0100 Acute maxillary sinusitis, unspecified: Secondary | ICD-10-CM

## 2018-06-16 DIAGNOSIS — J111 Influenza due to unidentified influenza virus with other respiratory manifestations: Secondary | ICD-10-CM

## 2018-06-16 DIAGNOSIS — B9789 Other viral agents as the cause of diseases classified elsewhere: Secondary | ICD-10-CM | POA: Diagnosis not present

## 2018-06-16 DIAGNOSIS — J069 Acute upper respiratory infection, unspecified: Secondary | ICD-10-CM

## 2018-06-16 DIAGNOSIS — R69 Illness, unspecified: Secondary | ICD-10-CM | POA: Diagnosis not present

## 2018-06-16 MED ORDER — LEVOFLOXACIN 500 MG PO TABS: 500.0000 mg | ORAL_TABLET | Freq: Every day | ORAL | 0 refills | Status: DC

## 2018-06-16 MED ORDER — PREDNISONE 10 MG PO TABS: ORAL_TABLET | ORAL | 0 refills | Status: DC

## 2018-06-16 MED ORDER — PROMETHAZINE-DM 6.25-15 MG/5ML PO SYRP: 5.0000 mL | ORAL_SOLUTION | Freq: Four times a day (QID) | ORAL | 1 refills | Status: DC | PRN

## 2018-06-16 NOTE — Patient Instructions (Signed)
Phenergan and prednisone taper for current symptoms   Add the levaquin if symptoms worsen  (facial pain,  Fevers do not resolve ,  Or ears start to hurt)   Breyer's carbsmart fudgsicle 5 g carbs  Great for your throat

## 2018-06-16 NOTE — Progress Notes (Signed)
Subjective:  Patient ID: Hailey Hunt, female    DOB: Apr 24, 1946  Age: 72 y.o. MRN: [id number]  CC: The primary encounter diagnosis was Acute non-recurrent maxillary sinusitis. Diagnoses of Influenza-like illness, Viral URI with cough, and Prediabetes were also pertinent to this visit.  HPI Hailey Hunt presents for eval and treatment of cough  And  Fever 101 for three nights,  Started Sunday with sore throat ear congestion. Appetite poor,  Denies body aches, nausea and diarrhea,  No sick contacts,     .last1C Lab Results  Component Value Date   HGBA1C 6.2 05/10/2018     Outpatient Medications Prior to Visit  Medication Sig Dispense Refill  . cholecalciferol (VITAMIN D) 1000 UNITS tablet Take 1,000 Units by mouth daily.    . cyanocobalamin (,VITAMIN B-12,) 1000 MCG/ML injection INJECT 1 ML IM EVERY MONTH 10 mL 3  . Cyanocobalamin (VITAMIN B 12 PO) Take by mouth.    Marland Kitchen ipratropium (ATROVENT) 0.03 % nasal spray Place 2 sprays into both nostrils every 12 (twelve) hours. 30 mL 12  . Lifitegrast (XIIDRA) 5 % SOLN Apply 1 drop to eye 2 (two) times daily.    Marland Kitchen SYNTHROID 50 MCG tablet TAKE 1 TABLET EVERY DAY ON EMPTY STOMACHWITH A GLASS OF WATER AT LEAST 30-60 MINBEFORE BREAKFAST 90 tablet 0  . Syringe/Needle, Disp, (SYRINGE 3CC/27GX1-1/4") 27G X 1-1/4" 3 ML MISC For B12 injections 100 each 0  . telmisartan (MICARDIS) 20 MG tablet Take 0.5 tablets (10 mg total) by mouth daily. 45 tablet 0  . traMADol (ULTRAM) 50 MG tablet Take 1 tablet (50 mg total) by mouth every 8 (eight) hours as needed. 30 tablet 3  . tretinoin (RETIN-A) 0.025 % cream Apply topically at bedtime. 45 g 1   Facility-Administered Medications Prior to Visit  Medication Dose Route Frequency Provider Last Rate Last Dose  . 0.9 %  sodium chloride infusion  500 mL Intravenous Continuous Pyrtle, Lajuan Lines, MD        Review of Systems;  Patient denies headache, fevers, malaise, unintentional weight loss, skin rash, eye pain,  sinus congestion and sinus pain, sore throat, dysphagia,  hemoptysis , cough, dyspnea, wheezing, chest pain, palpitations, orthopnea, edema, abdominal pain, nausea, melena, diarrhea, constipation, flank pain, dysuria, hematuria, urinary  Frequency, nocturia, numbness, tingling, seizures,  Focal weakness, Loss of consciousness,  Tremor, insomnia, depression, anxiety, and suicidal ideation.      Objective:  BP (!) 92/52   Pulse 88   Temp 98 F (36.7 C) (Oral)   Ht 5\' 3"  (1.6 m)   Wt 94 lb 9.6 oz (42.9 kg)   SpO2 94%   BMI 16.76 kg/m   BP Readings from Last 3 Encounters:  06/16/18 (!) 92/52  05/10/18 102/62  05/10/18 102/62    Wt Readings from Last 3 Encounters:  06/16/18 94 lb 9.6 oz (42.9 kg)  05/10/18 98 lb 6.4 oz (44.6 kg)  05/10/18 98 lb 6.4 oz (44.6 kg)    General appearance: thin,  Ill appearing ,  alert, cooperative and appears stated age Ears: normal TM's and external ear canals both ears Throat: lips, mucosa, and tongue normal; teeth and gums normal Neck: no adenopathy, no carotid bruit, supple, symmetrical, trachea midline and thyroid not enlarged, symmetric, no tenderness/mass/nodules Back: symmetric, no curvature. ROM normal. No CVA tenderness. Lungs:  ronchi without egophony  bilaterally Heart: regular rate and rhythm, S1, S2 normal, no murmur, click, rub or gallop Abdomen: soft, non-tender; bowel sounds normal; no masses,  no organomegaly Pulses: 2+ and symmetric Skin: Skin color, texture, turgor normal. No rashes or lesions Lymph nodes: Cervical, supraclavicular, and axillary nodes normal.  Lab Results  Component Value Date   HGBA1C 6.2 05/10/2018   HGBA1C 5.3 12/21/2017   HGBA1C 5.9 12/20/2017    Lab Results  Component Value Date   CREATININE 1.06 05/10/2018   CREATININE 0.85 02/02/2018   CREATININE 0.79 12/23/2017    Lab Results  Component Value Date   WBC 5.8 06/16/2018   HGB 11.6 (L) 06/16/2018   HCT 34.7 (L) 06/16/2018   PLT 269.0 06/16/2018    GLUCOSE 95 05/10/2018   CHOL 193 12/23/2017   TRIG 54.0 12/23/2017   HDL 83.50 12/23/2017   LDLDIRECT 92.0 11/19/2015   LDLCALC 99 12/23/2017   ALT 13 05/10/2018   AST 18 05/10/2018   NA 140 05/10/2018   K 4.2 05/10/2018   CL 104 05/10/2018   CREATININE 1.06 05/10/2018   BUN 25 (H) 05/10/2018   CO2 29 05/10/2018   TSH 2.05 05/10/2018   HGBA1C 6.2 05/10/2018   MICROALBUR 1.8 12/23/2017    Ct Chest Wo Contrast  Result Date: 07/24/2017 CLINICAL DATA:  Follow-up pulmonary nodules. EXAM: CT CHEST WITHOUT CONTRAST TECHNIQUE: Multidetector CT imaging of the chest was performed following the standard protocol without IV contrast. COMPARISON:  02/12/2017 chest CT. FINDINGS: Cardiovascular: Normal heart size. No significant pericardial fluid/thickening. Mildly atherosclerotic nonaneurysmal thoracic aorta. Aberrant nonaneurysmal right subclavian artery arising from the distal aortic arch with retroesophageal course . Normal caliber pulmonary arteries. Mediastinum/Nodes: No discrete thyroid nodules. Unremarkable esophagus. No pathologically enlarged axillary, mediastinal or gross hilar lymph nodes, noting limited sensitivity for the detection of hilar adenopathy on this noncontrast study. Lungs/Pleura: No pneumothorax. No pleural effusion. Punctate 2 mm posterior right lower lobe granuloma is stable. Two solid left lower lobe pulmonary nodules are stable, largest 5 mm (series 3/ image 138). No additional significant pulmonary nodules. Previously described opacity in the inferior right middle lobe is stable and seen to represent a tiny thin parenchymal band on coronal sequence, compatible with postinfectious/ postinflammatory scar. No acute consolidative airspace disease or lung masses. Upper abdomen: Subcentimeter hypodense anterior left liver lobe lesion is too small to characterize and stable, considered benign. Musculoskeletal:  No aggressive appearing focal osseous lesions. IMPRESSION: 1. Interval  stability of small solid pulmonary nodules in the left lower lobe, considered benign. 2. No active cardiopulmonary disease. 3. Aberrant right subclavian artery. Aortic Atherosclerosis (ICD10-I70.0). Electronically Signed   By: Ilona Sorrel M.D.   On: 07/24/2017 11:12    Assessment & Plan:   Problem List Items Addressed This Visit    Prediabetes    A1c has risen in spite of carbohydrate restriction , but her fasting glucose is normal . Continue mediterranean diet .  A total of 25 minutes of face to face time was spent with patient,  more than half of which was spent in counselling about   Her diet  and coordination of care   Lab Results  Component Value Date   HGBA1C 6.2 05/10/2018            Viral URI with cough    Phenergan for cough suppression,  Prednisone for inflammation.  Add levaqin if no improvement in 48 hours.        Other Visit Diagnoses    Acute non-recurrent maxillary sinusitis    -  Primary   Relevant Medications   promethazine-dextromethorphan (PROMETHAZINE-DM) 6.25-15 MG/5ML syrup   levofloxacin (LEVAQUIN) 500  MG tablet   predniSONE (DELTASONE) 10 MG tablet   Other Relevant Orders   CBC with Differential/Platelet (Completed)   Influenza-like illness          I am having Hailey Hunt "Jocelyn Lamer" start on levofloxacin and predniSONE. I am also having her maintain her SYRINGE 3CC/27GX1-1/4", cholecalciferol, Cyanocobalamin (VITAMIN B 12 PO), cyanocobalamin, traMADol, telmisartan, tretinoin, SYNTHROID, Lifitegrast, ipratropium, and promethazine-dextromethorphan. We will continue to administer sodium chloride.  Meds ordered this encounter  Medications  . promethazine-dextromethorphan (PROMETHAZINE-DM) 6.25-15 MG/5ML syrup    Sig: Take 5 mLs by mouth 4 (four) times daily as needed for cough.    Dispense:  180 mL    Refill:  1  . levofloxacin (LEVAQUIN) 500 MG tablet    Sig: Take 1 tablet (500 mg total) by mouth daily.    Dispense:  7 tablet    Refill:  0  .  predniSONE (DELTASONE) 10 MG tablet    Sig: 6 tablets on Day 1 , then reduce by 1 tablet daily until gone    Dispense:  21 tablet    Refill:  0    There are no discontinued medications.  Follow-up: No follow-ups on file.   Crecencio Mc, MD

## 2018-06-16 NOTE — Progress Notes (Signed)
Pre visit review using our clinic review tool, if applicable. No additional management support is needed unless otherwise documented below in the visit note. 

## 2018-06-17 LAB — CBC WITH DIFFERENTIAL/PLATELET
Basophils Absolute: 0 10*3/uL (ref 0.0–0.1)
Basophils Relative: 0.3 % (ref 0.0–3.0)
Eosinophils Absolute: 0.1 10*3/uL (ref 0.0–0.7)
Eosinophils Relative: 1 % (ref 0.0–5.0)
HCT: 34.7 % — ABNORMAL LOW (ref 36.0–46.0)
Hemoglobin: 11.6 g/dL — ABNORMAL LOW (ref 12.0–15.0)
Lymphocytes Relative: 27.7 % (ref 12.0–46.0)
Lymphs Abs: 1.6 10*3/uL (ref 0.7–4.0)
MCHC: 33.4 g/dL (ref 30.0–36.0)
MCV: 89.4 fl (ref 78.0–100.0)
Monocytes Absolute: 0.7 10*3/uL (ref 0.1–1.0)
Monocytes Relative: 12.3 % — ABNORMAL HIGH (ref 3.0–12.0)
Neutro Abs: 3.4 10*3/uL (ref 1.4–7.7)
Neutrophils Relative %: 58.7 % (ref 43.0–77.0)
Platelets: 269 10*3/uL (ref 150.0–400.0)
RBC: 3.88 Mil/uL (ref 3.87–5.11)
RDW: 15.4 % (ref 11.5–15.5)
WBC: 5.8 10*3/uL (ref 4.0–10.5)

## 2018-06-19 DIAGNOSIS — J069 Acute upper respiratory infection, unspecified: Secondary | ICD-10-CM | POA: Insufficient documentation

## 2018-06-19 DIAGNOSIS — B9789 Other viral agents as the cause of diseases classified elsewhere: Secondary | ICD-10-CM

## 2018-06-19 NOTE — Assessment & Plan Note (Signed)
A1c has risen in spite of carbohydrate restriction , but her fasting glucose is normal . Continue mediterranean diet .  A total of 25 minutes of face to face time was spent with patient,  more than half of which was spent in counselling about   Her diet  and coordination of care   Lab Results  Component Value Date   HGBA1C 6.2 05/10/2018

## 2018-06-19 NOTE — Assessment & Plan Note (Signed)
Phenergan for cough suppression,  Prednisone for inflammation.  Add levaqin if no improvement in 48 hours.

## 2018-06-24 ENCOUNTER — Ambulatory Visit (INDEPENDENT_AMBULATORY_CARE_PROVIDER_SITE_OTHER): Payer: Medicare Other | Admitting: Internal Medicine

## 2018-06-24 ENCOUNTER — Encounter: Payer: Self-pay | Admitting: Internal Medicine

## 2018-06-24 DIAGNOSIS — R634 Abnormal weight loss: Secondary | ICD-10-CM | POA: Diagnosis not present

## 2018-06-24 NOTE — Patient Instructions (Addendum)
I recommend seeing Dr Renne Crigler  And will contact her for you   To make a low carb chip :  Take the [employer] Lavash or Pita bread,  Or the [place] Low carb whole wheat tortilla   Place on metal cookie sheet  Brush with olive oil  Sprinkle garlic powder (NOT garlic salt), grated parmesan cheese, mediterranean seasoning , or all of them?  Bake at 275 for 30 minutes   We have substitutions for your potatoes!!  Try the mashed cauliflower and riced cauliflower dishes instead of rice and mashed potatoes  Mashed turnips are also very low carb!   For desserts :  Try the [employer] Fit greek yogurt dessert flavors and top with reddi Whip .  8 carbs,  80 calories  Try [employer]ikos Triple Zero [place] Yogurt in the salted caramel, and the coffee flavors  With Whipped Cream for dessert  breyer's low carb ice cream, available in bars (on a stick, better ) or scoopable ice cream  HERE ARE THE LOW CARB  BREAD CHOICES

## 2018-06-24 NOTE — Progress Notes (Signed)
Subjective:  Patient ID: Hailey Hunt, female    DOB: 1946-01-16  Age: 72 y.o. MRN: [id number]  CC: The encounter diagnosis was Loss of weight.  HPI Hailey Hunt presents for unintentional weight loss attributed to prediabetes vs new onset diabetes in setiting of recent use of prednisone.  Patient follows a low GI diet for management of prediabetes (last a1c was 6.2).  She was treated last week for a viral URI with persistent cough and sinusitis with prednisone taper .  Her symptoms improved dramatically   But she developed nocturia x 4 and rapid weight loss of 3 lbs in one week.  Fasting  Glucose was 154 once during prednisone use but is back donw to 81 since stopping it.  (post prandial 104) She has  hypothyroidism , but TSH was 2 in August and she denies tremor,  Palpitations and diaphoresis.     Lab Results  Component Value Date   HGBA1C 6.2 05/10/2018    Outpatient Medications Prior to Visit  Medication Sig Dispense Refill  . cholecalciferol (VITAMIN D) 1000 UNITS tablet Take 1,000 Units by mouth daily.    . cyanocobalamin (,VITAMIN B-12,) 1000 MCG/ML injection INJECT 1 ML IM EVERY MONTH 10 mL 3  . Cyanocobalamin (VITAMIN B 12 PO) Take by mouth.    Marland Kitchen ipratropium (ATROVENT) 0.03 % nasal spray Place 2 sprays into both nostrils every 12 (twelve) hours. 30 mL 12  . Lifitegrast (XIIDRA) 5 % SOLN Apply 1 drop to eye 2 (two) times daily.    . promethazine-dextromethorphan (PROMETHAZINE-DM) 6.25-15 MG/5ML syrup Take 5 mLs by mouth 4 (four) times daily as needed for cough. 180 mL 1  . SYNTHROID 50 MCG tablet TAKE 1 TABLET EVERY DAY ON EMPTY STOMACHWITH A GLASS OF WATER AT LEAST 30-60 MINBEFORE BREAKFAST 90 tablet 0  . Syringe/Needle, Disp, (SYRINGE 3CC/27GX1-1/4") 27G X 1-1/4" 3 ML MISC For B12 injections 100 each 0  . telmisartan (MICARDIS) 20 MG tablet Take 0.5 tablets (10 mg total) by mouth daily. 45 tablet 0  . traMADol (ULTRAM) 50 MG tablet Take 1 tablet (50 mg total) by mouth  every 8 (eight) hours as needed. 30 tablet 3  . tretinoin (RETIN-A) 0.025 % cream Apply topically at bedtime. 45 g 1  . levofloxacin (LEVAQUIN) 500 MG tablet Take 1 tablet (500 mg total) by mouth daily. (Patient not taking: Reported on 06/24/2018) 7 tablet 0  . predniSONE (DELTASONE) 10 MG tablet 6 tablets on Day 1 , then reduce by 1 tablet daily until gone (Patient not taking: Reported on 06/24/2018) 21 tablet 0   Facility-Administered Medications Prior to Visit  Medication Dose Route Frequency Provider Last Rate Last Dose  . 0.9 %  sodium chloride infusion  500 mL Intravenous Continuous Pyrtle, Lajuan Lines, MD        Review of Systems;  Patient denies headache, fevers, malaise, unintentional weight loss, skin rash, eye pain, sinus congestion and sinus pain, sore throat, dysphagia,  hemoptysis , cough, dyspnea, wheezing, chest pain, palpitations, orthopnea, edema, abdominal pain, nausea, melena, diarrhea, constipation, flank pain, dysuria, hematuria, urinary  Frequency, nocturia, numbness, tingling, seizures,  Focal weakness, Loss of consciousness,  Tremor, insomnia, depression, anxiety, and suicidal ideation.      Objective:  BP 122/70 (BP Location: Left Arm, Patient Position: Sitting, Cuff Size: Small)   Pulse 71   Temp 97.6 F (36.4 C) (Oral)   Resp 14   Ht 5' 3.5" (1.613 m)   Wt 92 lb 4 oz (  41.8 kg)   SpO2 97%   BMI 16.09 kg/m   BP Readings from Last 3 Encounters:  06/24/18 122/70  06/16/18 (!) 92/52  05/10/18 102/62    Wt Readings from Last 3 Encounters:  06/24/18 92 lb 4 oz (41.8 kg)  06/16/18 94 lb 9.6 oz (42.9 kg)  05/10/18 98 lb 6.4 oz (44.6 kg)    General appearance: alert, cooperative and appears stated age Ears: normal TM's and external ear canals both ears Throat: lips, mucosa, and tongue normal; teeth and gums normal Neck: no adenopathy, no carotid bruit, supple, symmetrical, trachea midline and thyroid not enlarged, symmetric, no tenderness/mass/nodules Back:  symmetric, no curvature. ROM normal. No CVA tenderness. Lungs: clear to auscultation bilaterally Heart: regular rate and rhythm, S1, S2 normal, no murmur, click, rub or gallop Abdomen: soft, non-tender; bowel sounds normal; no masses,  no organomegaly Pulses: 2+ and symmetric Skin: Skin color, texture, turgor normal. No rashes or lesions Lymph nodes: Cervical, supraclavicular, and axillary nodes normal.  Lab Results  Component Value Date   HGBA1C 6.2 05/10/2018   HGBA1C 5.3 12/21/2017   HGBA1C 5.9 12/20/2017    Lab Results  Component Value Date   CREATININE 1.06 05/10/2018   CREATININE 0.85 02/02/2018   CREATININE 0.79 12/23/2017    Lab Results  Component Value Date   WBC 5.8 06/16/2018   HGB 11.6 (L) 06/16/2018   HCT 34.7 (L) 06/16/2018   PLT 269.0 06/16/2018   GLUCOSE 95 05/10/2018   CHOL 193 12/23/2017   TRIG 54.0 12/23/2017   HDL 83.50 12/23/2017   LDLDIRECT 92.0 11/19/2015   LDLCALC 99 12/23/2017   ALT 13 05/10/2018   AST 18 05/10/2018   NA 140 05/10/2018   K 4.2 05/10/2018   CL 104 05/10/2018   CREATININE 1.06 05/10/2018   BUN 25 (H) 05/10/2018   CO2 29 05/10/2018   TSH 2.05 05/10/2018   HGBA1C 6.2 05/10/2018   MICROALBUR 1.8 12/23/2017    Ct Chest Wo Contrast  Result Date: 07/24/2017 CLINICAL DATA:  Follow-up pulmonary nodules. EXAM: CT CHEST WITHOUT CONTRAST TECHNIQUE: Multidetector CT imaging of the chest was performed following the standard protocol without IV contrast. COMPARISON:  02/12/2017 chest CT. FINDINGS: Cardiovascular: Normal heart size. No significant pericardial fluid/thickening. Mildly atherosclerotic nonaneurysmal thoracic aorta. Aberrant nonaneurysmal right subclavian artery arising from the distal aortic arch with retroesophageal course . Normal caliber pulmonary arteries. Mediastinum/Nodes: No discrete thyroid nodules. Unremarkable esophagus. No pathologically enlarged axillary, mediastinal or gross hilar lymph nodes, noting limited  sensitivity for the detection of hilar adenopathy on this noncontrast study. Lungs/Pleura: No pneumothorax. No pleural effusion. Punctate 2 mm posterior right lower lobe granuloma is stable. Two solid left lower lobe pulmonary nodules are stable, largest 5 mm (series 3/ image 138). No additional significant pulmonary nodules. Previously described opacity in the inferior right middle lobe is stable and seen to represent a tiny thin parenchymal band on coronal sequence, compatible with postinfectious/ postinflammatory scar. No acute consolidative airspace disease or lung masses. Upper abdomen: Subcentimeter hypodense anterior left liver lobe lesion is too small to characterize and stable, considered benign. Musculoskeletal:  No aggressive appearing focal osseous lesions. IMPRESSION: 1. Interval stability of small solid pulmonary nodules in the left lower lobe, considered benign. 2. No active cardiopulmonary disease. 3. Aberrant right subclavian artery. Aortic Atherosclerosis (ICD10-I70.0). Electronically Signed   By: Ilona Sorrel M.D.   On: 07/24/2017 11:12    Assessment & Plan:   Problem List Items Addressed This Visit  Loss of weight    Significant in a post menopausal woman with baseline BMI  < 18, hypothyroidism and prediabetes .  She is concerned that the prednisone recently use triggered a progression to type 2 DM , however her BS has returned to normal.  Discussed ways to increase her protein intake and have her  see her endocrinologist, Dr. Renne Crigler,  for evaluation .  If there  Is no endocrine cause for her weight loss, will pursue workup to rule out occult neoplasm.  Howevere she is up to date on screening for colon , breast and is s/p TAH/BSO  .         A total of 25 minutes of face to face time was spent with patient more than half of which was spent in counselling about the above mentioned conditions  and coordination of care   I have discontinued Bettina B. Rocca "[name]"'s levofloxacin and  predniSONE. I am also having her maintain her SYRINGE 3CC/27GX1-1/4", cholecalciferol, Cyanocobalamin (VITAMIN B 12 PO), cyanocobalamin, traMADol, telmisartan, tretinoin, SYNTHROID, Lifitegrast, ipratropium, and promethazine-dextromethorphan. We will continue to administer sodium chloride.  No orders of the defined types were placed in this encounter.   Medications Discontinued During This Encounter  Medication Reason  . levofloxacin (LEVAQUIN) 500 MG tablet Completed Course  . predniSONE (DELTASONE) 10 MG tablet Completed Course    Follow-up: No follow-ups on file.   Crecencio Mc, MD

## 2018-06-26 NOTE — Assessment & Plan Note (Addendum)
Significant in a post menopausal woman with baseline BMI  < 18, hypothyroidism and prediabetes .  She is concerned that the prednisone recently use triggered a progression to type 2 DM , however her BS has returned to normal.  Discussed ways to increase her protein intake and have her  see her endocrinologist, Dr. Renne Crigler,  for evaluation .  If there  Is no endocrine cause for her weight loss, will pursue workup to rule out occult neoplasm.  Howevere she is up to date on screening for colon , breast and is s/p TAH/BSO  .

## 2018-06-28 ENCOUNTER — Other Ambulatory Visit: Payer: Self-pay | Admitting: Internal Medicine

## 2018-06-30 ENCOUNTER — Ambulatory Visit (INDEPENDENT_AMBULATORY_CARE_PROVIDER_SITE_OTHER): Payer: Medicare Other | Admitting: Internal Medicine

## 2018-06-30 ENCOUNTER — Encounter: Payer: Self-pay | Admitting: Internal Medicine

## 2018-06-30 VITALS — BP 96/60 | HR 70 | Ht 63.5 in | Wt 91.0 lb

## 2018-06-30 DIAGNOSIS — M8889 Osteitis deformans of multiple sites: Secondary | ICD-10-CM | POA: Diagnosis not present

## 2018-06-30 DIAGNOSIS — N028 Recurrent and persistent hematuria with other morphologic changes: Secondary | ICD-10-CM | POA: Insufficient documentation

## 2018-06-30 DIAGNOSIS — E890 Postprocedural hypothyroidism: Secondary | ICD-10-CM

## 2018-06-30 DIAGNOSIS — M8580 Other specified disorders of bone density and structure, unspecified site: Secondary | ICD-10-CM | POA: Diagnosis not present

## 2018-06-30 DIAGNOSIS — R7303 Prediabetes: Secondary | ICD-10-CM

## 2018-06-30 NOTE — Patient Instructions (Addendum)
Please stop at the lab.  Please send me a MyChart message in 11/2018 to order the next DXA scan.  Please come back for a follow-up appointment in 12/2018.

## 2018-06-30 NOTE — Progress Notes (Signed)
Patient ID: Hailey Hunt, female   DOB: [birth year], 72 y.o.   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  Last visit 5.5 months ago.  She scheduled this appointment sooner than regular due to her recent high HbA1c.  She had a recent HbA1c that was higher, at 6.2%.  She is worried about type 1 diabetes as she is continuing to lose weight.  However, she did start to change her diet (see below).  She was given a glucometer by PCP. She checked her sugars after hamburger and fries >> 187. She became very concerned and started to cut out regular sodas, sugary tea, potato chips, simple sweets since then.  She had a Prednisone taper >> sugars 150 in am.  However, this happened after the last HbA1c returned.  Reviewed her meter download - last  2 weeks: - am: 78-93, 123 - 2h after b'fast: 92-120 - lunch: 94, 103 - 2h after lunch: 102-113, 123 - dinner: 93-108 - 2h after dinner: 98, 116 - bedtime: n/c  Reviewed and addended history: She had a CTA abd/pelvis on 06/2015:incidental lesion:  Diffuse patchy sclerosis and cortical thickening of the left iliac bone, favored to represent Paget's disease.  She has had an anteverted L femur since her 47s. She has had pain in that his ever since.    Bone scan (01/02/2016): FINDINGS: There is asymmetric increased uptake throughout the left iliac bone. The appearance and distribution is compatible with Paget's disease. Increased uptake in the distribution of the nasal bone is also noted, nonspecific. This could be seen with Paget's disease, fibrous dysplasia or chronic sinusitis. No findings identified to suggest bone metastases. Physiologic uptake is noted within the kidneys and urinary bladder.  IMPRESSION: 1. Increased uptake within the left iliac bone compatible with Paget's disease. 2. Central area of increased uptake in the region of the nasal  bone which may also reflect Paget's disease of the skull.  Reviewed alkaline phosphatase levels - stable: Lab Results  Component Value Date   ALKPHOS 78 05/10/2018   ALKPHOS 71 12/23/2017   ALKPHOS 82 07/13/2017   ALKPHOS 87 09/18/2016   ALKPHOS 73 11/19/2015   ALKPHOS 96 08/14/2014   ALKPHOS 88 07/06/2014   ALKPHOS 91 08/29/2013   ALKPHOS 86 03/03/2012   ALKPHOS 90 01/20/2012   Reviewed most recent DXA scan reports: Date L1-L4 T score FN T score  12/16/2016 ([place]) -0.40 RFN:-2.2 LFN:-2.1  01/22/2012 ([facility])  -0.60 LFN: -0.66    She denies fractures (traumatic vb fracture as a child) or falls.  She has a history of dizziness, not recently.  She has been on the following OP treatments:  - Actonel for 2 years. This was apparently stopped due to recurrent kidney stones -last episode 2010.  She did not feel good on this.  Latest vitamin D level normal: Lab Results  Component Value Date   VD25OH 60.99 02/02/2018   VD25OH 55.42 07/13/2017   VD25OH 49.37 12/12/2016   VD25OH 49.43 11/19/2015   VD25OH 46.46 08/14/2014   VD25OH 62 08/29/2013   VD25OH 21 (L) 01/20/2012   She is on 1000 units vitamin D daily.  She eats dairy and green leafy vegetables.  She walks for exercise.  Surgical menopause was at 72 y/o.   Pt does not have a FH of osteoporosis.  No history of hypo-or hypercalcemia and no hyperparathyroidism: Lab Results  Component Value Date  CALCIUM 10.2 05/10/2018   CALCIUM 9.7 02/02/2018   CALCIUM 9.6 12/23/2017   CALCIUM 9.5 12/04/2017   CALCIUM 9.1 10/09/2017   CALCIUM 10.0 07/13/2017   CALCIUM 9.8 09/18/2016   CALCIUM 9.8 11/19/2015   CALCIUM 10.0 03/28/2015   CALCIUM 9.7 08/14/2014   She has a history of Graves' disease, status post RAI treatment in the 14s.  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; last BUN/Cr reviewed: Lab Results  Component Value Date   BUN 25 (H) 05/10/2018   CREATININE 1.06 05/10/2018   Latest HbA1c was higher, due to being on steroids prior to this: Lab Results  Component Value Date   HGBA1C 6.2 05/10/2018   HGBA1C 5.3 12/21/2017   HGBA1C 5.9 12/20/2017   HGBA1C 5.3 12/19/2017   HGBA1C 5.3 12/19/2017   HGBA1C 5.3 12/18/2017   HGBA1C 5.3 12/18/2017   HGBA1C 5.9 07/13/2017   HGBA1C 5.8 09/18/2016    ROS: Constitutional: + weight loss, no fatigue, no subjective hyperthermia, no subjective hypothermia, + nocturia Eyes: no blurry vision, + xerophthalmia ENT: no sore throat, no nodules palpated in throat, no dysphagia, no odynophagia, no hoarseness Cardiovascular: no CP/no SOB/no palpitations/no leg swelling Respiratory: no cough/no SOB/no wheezing Gastrointestinal: no N/no V/no D/no C/no acid reflux Musculoskeletal: no muscle aches/no joint aches Skin: no rashes, no hair loss Neurological: no tremors/no numbness/no tingling/no dizziness  I reviewed pt's medications, allergies, PMH, social hx, family hx, and changes were documented in the history of present illness. Otherwise, unchanged from my initial visit note. Phebe Colla for dry eyes -her co-pay is more than $200.  Past Medical History:  Diagnosis Date  . Aberrant right subclavian artery   . Cataract    surgery  . Clotting disorder (HCC)    hx of bleeding after vaginal delivery, dr told her not to have any surgeries that are necessary  . Dysplasia, artery, fibromuscular, renal ([place])   . Fibromuscular dysplasia ([place])   . Fibromuscular dysplasia of renal artery (HCC)    managed by Dr. Gregary [facility]  . Graves disease   . Heart murmur   . Heart murmur   . Hypertension   .  Kidney stone   . Migraines   . Neuropathy with IgA monoclonal gammopathy (HCC)    Dr Mervyn Skeeters  . Paget's disease of the bone   . Pernicious anemia   . Thyroid disease   . Urinary incontinence, mixed    resolved with removal of ained sutures in bladder    Past Surgical History:  Procedure Laterality Date  . BILATERAL SALPINGOOPHORECTOMY    . CATARACT EXTRACTION  Sep 10, 2011, Jan 13,2013  . COLONOSCOPY    . RENAL BIOPSY    . TONSILLECTOMY    . VAGINAL DELIVERY     x2  . VAGINAL HYSTERECTOMY    . WISDOM TOOTH EXTRACTION     Social History   Socioeconomic History  . Marital status: Married    Spouse name: Not on file  . Number of children: 2  . Years of education: Not  on file  . Highest education level: Not on file  Occupational History  . Occupation: Network engineer for [employer]  . Financial resource strain: Not hard at all  . Food insecurity:    Worry: Never true    Inability: Never true  . Transportation needs:    Medical: No    Non-medical: No  Tobacco Use  . Smoking status: Never Smoker  . Smokeless tobacco: Never Used  Substance and Sexual Activity  . Alcohol use: Yes    Comment: once a year  . Drug use: No  . Sexual activity: Not Currently  Lifestyle  . Physical activity:    Days per week: Not on file    Minutes per session: Not on file  . Stress: Not on file  Relationships  . Social connections:    Talks on phone: Not on file    Gets together: Not on file    Attends religious service: Not on file    Active member of club or organization: Not on file    Attends meetings of clubs or organizations: Not on file    Relationship status: Not on file  . Intimate partner violence:    Fear of current or ex partner: No    Emotionally abused: No    Physically abused: No    Forced sexual activity: No  Other Topics Concern  . Not on file  Social History Narrative  . Not on file   Current Outpatient Medications on File Prior to Visit  Medication Sig  Dispense Refill  . cholecalciferol (VITAMIN D) 1000 UNITS tablet Take 1,000 Units by mouth daily.    . cyanocobalamin (,VITAMIN B-12,) 1000 MCG/ML injection INJECT 1 ML IM EVERY MONTH 10 mL 3  . Cyanocobalamin (VITAMIN B 12 PO) Take by mouth.    Marland Kitchen Lifitegrast (XIIDRA) 5 % SOLN Apply 1 drop to eye 2 (two) times daily.    . promethazine-dextromethorphan (PROMETHAZINE-DM) 6.25-15 MG/5ML syrup Take 5 mLs by mouth 4 (four) times daily as needed for cough. 180 mL 1  . SYNTHROID 50 MCG tablet TAKE ONE TABLET ON AN EMPTY STOMACH WITHA GLASS OF WATER AT LEAST 30 TO 60 MINUTES BEFORE BREAKFAST 90 tablet 0  . Syringe/Needle, Disp, (SYRINGE 3CC/27GX1-1/4") 27G X 1-1/4" 3 ML MISC For B12 injections 100 each 0  . telmisartan (MICARDIS) 20 MG tablet Take 0.5 tablets (10 mg total) by mouth daily. 45 tablet 0  . traMADol (ULTRAM) 50 MG tablet Take 1 tablet (50 mg total) by mouth every 8 (eight) hours as needed. 30 tablet 3  . tretinoin (RETIN-A) 0.025 % cream Apply topically at bedtime. 45 g 1  . ipratropium (ATROVENT) 0.03 % nasal spray Place 2 sprays into both nostrils every 12 (twelve) hours. (Patient not taking: Reported on 06/30/2018) 30 mL 12   Current Facility-Administered Medications on File Prior to Visit  Medication Dose Route Frequency Provider Last Rate Last Dose  . 0.9 %  sodium chloride infusion  500 mL Intravenous Continuous Pyrtle, Lajuan Lines, MD       Allergies  Allergen Reactions  . Voltaren [Diclofenac] Hypertension  . Adhesive [Tape]   . Demerol     HYPOTENSION  . Macrodantin   . Nsaids Hypertension  . Voltaren [Diclofenac Sodium] Other (See Comments)    FMD, increased bp  . Codeine Nausea And Vomiting and Rash  . Nickel Rash  . Penicillins Rash  . Sulfa Antibiotics Rash    Fever   Family History  Problem  Relation Age of Onset  . Anuerysm Mother   . COPD Mother   . Hypertension Mother   . Heart disease Mother   . Heart failure Mother   . Cancer Father        lung CA  . Heart  disease Father   . Heart attack Father   . Heart disease Brother        viral cardiomyopathy, cocaine use  . Drug abuse Brother   . Cancer Maternal Aunt        esophageal and live rCA,  hisotory of  ETOH and tobacco  . Anuerysm Maternal Grandfather   . Cancer Paternal Grandmother        brain tumor  . Diabetes Sister   . Colon cancer Paternal Uncle    PE: BP 96/60   Pulse 70   Ht 5' 3.5" (1.613 m)   Wt 91 lb (41.3 kg)   SpO2 97%   BMI 15.87 kg/m  Body mass index is 15.87 kg/m. Wt Readings from Last 3 Encounters:  06/30/18 91 lb (41.3 kg)  06/24/18 92 lb 4 oz (41.8 kg)  06/16/18 94 lb 9.6 oz (42.9 kg)   Constitutional: Thin, in NAD Eyes: PERRLA, EOMI, no exophthalmos ENT: moist mucous membranes, no thyromegaly, no cervical lymphadenopathy Cardiovascular: RRR, No MRG Respiratory: CTA B Gastrointestinal: abdomen soft, NT, ND, BS+ Musculoskeletal: no deformities, strength intact in all 4 Skin: moist, warm, no rashes Neurological: no tremor with outstretched hands, DTR normal in all 4  Assessment: 1. Paget disease  2. Osteopenia  3. Postablative hypothyroidism - RAI tx for Graves ds. In 1980s  4.  Prediabetes  Plan: 1. Paget ds. -The lesion in the left hip was observed incidentally on pelvic CTA at [facility].  This was then again noticed on the bone scan from 2017, along with a nasal focus. -We discussed at previous visits that Paget's disease lesions to the [place] need to be treated if they are localized and meet several criteria: No pain, no increased erythema/calor, no contiguity with the joint, not in the weightbearing joint.  Severe complications like bone cancer, fractures, heart failure, are extremely rare nowadays. -We reviewed together her latest alkaline phosphatase levels, which have been normal, including the one from 04/2018 -No pain or warmth at the site of her pagetoid bones. -No intervention is needed for now since there are no concerning signs from her  Paget's disease. -She would like to avoid bisphosphonates due to previous kidney stones - will see her back in 1 year  2. Osteopenia -Latest bone density test showed osteopenia at the level of the hips. -Latest vitamin D level was normal 01/2018 -New DEXA is due 11/2018 >> I asked her to send me a message through my chart so I can order this -I will see her in 12/2018 and will discuss about the DEXA scan results at that time  3. Postablative hypothyroidism - latest thyroid labs reviewed with pt >> normal 04/2018 - she continues on LT4 50 mcg daily - pt feels good on this dose. - we discussed about taking the thyroid hormone every day, with water, >30 minutes before breakfast, separated by >4 hours from acid reflux medications, calcium, iron, multivitamins. Pt. is taking it correctly.  4.  Prediabetes - slightly worse HbA1c at last check: 6.2%. Since then, she cut out fast foods, concentrated sweets and some starches >> potatoes.  As expected, she started to lose weight (9 pounds since last visit).  She is worried about  this and wondering whether she has type 1 diabetes.  I would not expect weight loss from diabetes unless her sugars are very uncontrolled.  Reviewing her sugar log at home, they are all at goal.  However, we will check her for type 1 diabetes at this visit: Orders Placed This Encounter  Procedures  . C-peptide  . Glucose, fasting  . Anti-islet cell antibody  . ZNT8 Antibodies  . Glutamic acid decarboxylase auto abs  . Anti-islet cell antibody  -We will see her back in 6 months  Component     Latest Ref Rng & Units 06/30/2018  C-Peptide     0.80 - 3.85 ng/mL 2.45  Glucose, Plasma     65 - 99 mg/dL 93  ZNT8 Antibodies      WILL FOLLOW  Glutamic Acid Decarb Ab     <5 IU/mL <5  Islet Cell Ab     Neg:<1:1 Negative   Labs so far negative for type 1 diabetes. Philemon Kingdom, MD PhD [facility]

## 2018-07-04 LAB — C-PEPTIDE: C-Peptide: 2.45 ng/mL (ref 0.80–3.85)

## 2018-07-04 LAB — GLUCOSE, FASTING: Glucose, Plasma: 93 mg/dL (ref 65–99)

## 2018-07-04 LAB — GLUTAMIC ACID DECARBOXYLASE AUTO ABS: Glutamic Acid Decarb Ab: <5 IU/mL (ref <5)

## 2018-07-08 ENCOUNTER — Encounter: Payer: Self-pay | Admitting: Internal Medicine

## 2018-07-09 DIAGNOSIS — I701 Atherosclerosis of renal artery: Secondary | ICD-10-CM | POA: Diagnosis not present

## 2018-07-09 DIAGNOSIS — I773 Arterial fibromuscular dysplasia: Secondary | ICD-10-CM | POA: Diagnosis not present

## 2018-07-15 LAB — ZNT8 ANTIBODIES: ZNT8 Antibodies: <15 U/mL

## 2018-07-15 LAB — ANTI-ISLET CELL ANTIBODY: Islet Cell Ab: NEGATIVE

## 2018-07-21 ENCOUNTER — Other Ambulatory Visit: Payer: Self-pay | Admitting: Internal Medicine

## 2018-07-21 DIAGNOSIS — Z23 Encounter for immunization: Secondary | ICD-10-CM | POA: Diagnosis not present

## 2018-08-16 ENCOUNTER — Other Ambulatory Visit: Payer: Self-pay | Admitting: Internal Medicine

## 2018-08-16 DIAGNOSIS — R7303 Prediabetes: Secondary | ICD-10-CM

## 2018-08-16 DIAGNOSIS — R1013 Epigastric pain: Secondary | ICD-10-CM

## 2018-08-16 MED ORDER — PANTOPRAZOLE SODIUM 40 MG PO TBEC: 40.0000 mg | DELAYED_RELEASE_TABLET | Freq: Every day | ORAL | 3 refills | Status: DC

## 2018-08-16 NOTE — Progress Notes (Signed)
a1c

## 2018-08-17 ENCOUNTER — Other Ambulatory Visit (INDEPENDENT_AMBULATORY_CARE_PROVIDER_SITE_OTHER): Payer: Medicare Other

## 2018-08-17 DIAGNOSIS — R1013 Epigastric pain: Secondary | ICD-10-CM

## 2018-08-17 DIAGNOSIS — R7303 Prediabetes: Secondary | ICD-10-CM

## 2018-08-17 LAB — COMPREHENSIVE METABOLIC PANEL
ALT: 70 U/L — ABNORMAL HIGH (ref 0–35)
AST: 54 U/L — ABNORMAL HIGH (ref 0–37)
Albumin: 4.4 g/dL (ref 3.5–5.2)
Alkaline Phosphatase: 130 U/L — ABNORMAL HIGH (ref 39–117)
BUN: 26 mg/dL — ABNORMAL HIGH (ref 6–23)
CO2: 25 mEq/L (ref 19–32)
Calcium: 9.8 mg/dL (ref 8.4–10.5)
Chloride: 107 mEq/L (ref 96–112)
Creatinine, Ser: 1 mg/dL (ref 0.40–1.20)
GFR: 58.25 mL/min — ABNORMAL LOW (ref >60.00)
Glucose, Bld: 89 mg/dL (ref 70–99)
Potassium: 4.1 mEq/L (ref 3.5–5.1)
Sodium: 142 mEq/L (ref 135–145)
Total Bilirubin: 0.6 mg/dL (ref 0.2–1.2)
Total Protein: 6.8 g/dL (ref 6.0–8.3)

## 2018-08-17 LAB — HEMOGLOBIN A1C: Hgb A1c MFr Bld: 5.8 % (ref 4.6–6.5)

## 2018-08-17 LAB — LIPASE: Lipase: 37 U/L (ref 11.0–59.0)

## 2018-08-19 ENCOUNTER — Ambulatory Visit: Payer: Medicare Other

## 2018-08-20 ENCOUNTER — Ambulatory Visit: Payer: Medicare Other

## 2018-08-23 ENCOUNTER — Ambulatory Visit
Admission: RE | Admit: 2018-08-23 | Discharge: 2018-08-23 | Disposition: A | Discharge status: Home / Self Care | Payer: Medicare Other | Source: Ambulatory Visit | Attending: Internal Medicine | Admitting: Internal Medicine

## 2018-08-23 DIAGNOSIS — N281 Cyst of kidney, acquired: Secondary | ICD-10-CM | POA: Insufficient documentation

## 2018-08-23 DIAGNOSIS — I773 Arterial fibromuscular dysplasia: Secondary | ICD-10-CM | POA: Diagnosis not present

## 2018-08-23 DIAGNOSIS — R1013 Epigastric pain: Secondary | ICD-10-CM

## 2018-08-24 ENCOUNTER — Other Ambulatory Visit: Payer: Self-pay | Admitting: Internal Medicine

## 2018-09-15 ENCOUNTER — Other Ambulatory Visit: Payer: Self-pay | Admitting: Internal Medicine

## 2018-10-15 DIAGNOSIS — Z681 Body mass index (BMI) 19 or less, adult: Secondary | ICD-10-CM | POA: Diagnosis not present

## 2018-10-15 DIAGNOSIS — N952 Postmenopausal atrophic vaginitis: Secondary | ICD-10-CM | POA: Diagnosis not present

## 2018-10-15 DIAGNOSIS — N390 Urinary tract infection, site not specified: Secondary | ICD-10-CM | POA: Diagnosis not present

## 2018-10-15 DIAGNOSIS — N393 Stress incontinence (female) (male): Secondary | ICD-10-CM | POA: Diagnosis not present

## 2018-11-11 ENCOUNTER — Encounter: Payer: Self-pay | Admitting: Internal Medicine

## 2018-11-11 ENCOUNTER — Ambulatory Visit (INDEPENDENT_AMBULATORY_CARE_PROVIDER_SITE_OTHER): Payer: Medicare Other | Admitting: Internal Medicine

## 2018-11-11 VITALS — BP 100/58 | HR 66 | Temp 97.3°F | Resp 15 | Ht 63.5 in | Wt 88.8 lb

## 2018-11-11 DIAGNOSIS — M889 Osteitis deformans of unspecified bone: Secondary | ICD-10-CM | POA: Diagnosis not present

## 2018-11-11 DIAGNOSIS — R911 Solitary pulmonary nodule: Secondary | ICD-10-CM | POA: Diagnosis not present

## 2018-11-11 DIAGNOSIS — E890 Postprocedural hypothyroidism: Secondary | ICD-10-CM

## 2018-11-11 DIAGNOSIS — R7303 Prediabetes: Secondary | ICD-10-CM | POA: Diagnosis not present

## 2018-11-11 DIAGNOSIS — E782 Mixed hyperlipidemia: Secondary | ICD-10-CM | POA: Diagnosis not present

## 2018-11-11 DIAGNOSIS — R634 Abnormal weight loss: Secondary | ICD-10-CM | POA: Diagnosis not present

## 2018-11-11 NOTE — Progress Notes (Signed)
Subjective:  Patient ID: Hailey Hunt, female    DOB: [birth year]  Age: 73 y.o. MRN: [id number]  CC: The primary encounter diagnosis was Prediabetes. Diagnoses of Solitary pulmonary nodule, Weight loss, Mixed hyperlipidemia, Paget's disease of bone, and Hypothyroidism following radioiodine therapy were also pertinent to this visit.  HPI Hailey Hunt presents for follow  up on hypertension , underweight,  CKD due to FMD and  prediabetes  Says she has gained weight ,  But by our scales she is down 3 lbs. Had been indulging more often  In healthy carbs other than those bread.  Reached a low of 83 lbs at home.   Indulges in potato chips.   Up to date  On screening.  DEXA 2018  pagets's disease  b12 injections Vit d supplementing     Lab Results  Component Value Date   HGBA1C 5.8 08/17/2018   Lab Results  Component Value Date   CREATININE 1.00 08/17/2018     Outpatient Medications Prior to Visit  Medication Sig Dispense Refill  . ASPIRIN 81 PO     . cholecalciferol (VITAMIN D) 1000 UNITS tablet Take 1,000 Units by mouth daily.    . cyanocobalamin (,VITAMIN B-12,) 1000 MCG/ML injection INJECT 1 ML IM EVERY MONTH 10 mL 3  . Cyanocobalamin (VITAMIN B 12 PO) Take by mouth.    Marland Kitchen Lifitegrast (XIIDRA) 5 % SOLN Apply 1 drop to eye 2 (two) times daily.    Marland Kitchen SYNTHROID 50 MCG tablet TAKE ONE TABLET ON AN EMPTY STOMACH WITHA GLASS OF WATER AT LEAST 30 TO 60 MINUTES BEFORE BREAKFAST 90 tablet 0  . Syringe/Needle, Disp, (SYRINGE 3CC/27GX1-1/4") 27G X 1-1/4" 3 ML MISC For B12 injections 100 each 0  . telmisartan (MICARDIS) 20 MG tablet TAKE HALF A TABLET BY MOUTH EVERY DAY 45 tablet 0  . traMADol (ULTRAM) 50 MG tablet Take 1 tablet (50 mg total) by mouth every 8 (eight) hours as needed. 30 tablet 3  . tretinoin (RETIN-A) 0.025 % cream Apply topically at bedtime. 45 g 1  . promethazine-dextromethorphan (PROMETHAZINE-DM) 6.25-15 MG/5ML syrup Take 5 mLs by mouth 4 (four) times daily as needed  for cough. (Patient not taking: Reported on 11/11/2018) 180 mL 1  . ipratropium (ATROVENT) 0.03 % nasal spray Place 2 sprays into both nostrils every 12 (twelve) hours. (Patient not taking: Reported on 06/30/2018) 30 mL 12  . pantoprazole (PROTONIX) 40 MG tablet Take 1 tablet (40 mg total) by mouth daily. 30 tablet 3   Facility-Administered Medications Prior to Visit  Medication Dose Route Frequency Provider Last Rate Last Dose  . 0.9 %  sodium chloride infusion  500 mL Intravenous Continuous Pyrtle, Lajuan Lines, MD        Review of Systems;  Patient denies headache, fevers, malaise, unintentional weight loss, skin rash, eye pain, sinus congestion and sinus pain, sore throat, dysphagia,  hemoptysis , cough, dyspnea, wheezing, chest pain, palpitations, orthopnea, edema, abdominal pain, nausea, melena, diarrhea, constipation, flank pain, dysuria, hematuria, urinary  Frequency, nocturia, numbness, tingling, seizures,  Focal weakness, Loss of consciousness,  Tremor, insomnia, depression, anxiety, and suicidal ideation.      Objective:  BP (!) 100/58 (BP Location: Left Arm, Patient Position: Sitting, Cuff Size: Normal)   Pulse 66   Temp (!) 97.3 F (36.3 C) (Oral)   Resp 15   Ht 5' 3.5" (1.613 m)   Wt 88 lb 12.8 oz (40.3 kg)   SpO2 97%   BMI 15.48 kg/m  BP Readings from Last 3 Encounters:  11/11/18 (!) 100/58  06/30/18 96/60  06/24/18 122/70    Wt Readings from Last 3 Encounters:  11/11/18 88 lb 12.8 oz (40.3 kg)  06/30/18 91 lb (41.3 kg)  06/24/18 92 lb 4 oz (41.8 kg)    General appearance: alert, cooperative and appears stated age Ears: normal TM's and external ear canals both ears Throat: lips, mucosa, and tongue normal; teeth and gums normal Neck: no adenopathy, no carotid bruit, supple, symmetrical, trachea midline and thyroid not enlarged, symmetric, no tenderness/mass/nodules Back: symmetric, no curvature. ROM normal. No CVA tenderness. Lungs: clear to auscultation  bilaterally Heart: regular rate and rhythm, S1, S2 normal, no murmur, click, rub or gallop Abdomen: soft, non-tender; bowel sounds normal; no masses,  no organomegaly Pulses: 2+ and symmetric Skin: Skin color, texture, turgor normal. No rashes or lesions Lymph nodes: Cervical, supraclavicular, and axillary nodes normal.  Lab Results  Component Value Date   HGBA1C 5.8 08/17/2018   HGBA1C 6.2 05/10/2018   HGBA1C 5.3 12/21/2017    Lab Results  Component Value Date   CREATININE 1.00 08/17/2018   CREATININE 1.06 05/10/2018   CREATININE 0.85 02/02/2018    Lab Results  Component Value Date   WBC 5.8 06/16/2018   HGB 11.6 (L) 06/16/2018   HCT 34.7 (L) 06/16/2018   PLT 269.0 06/16/2018   GLUCOSE 89 08/17/2018   CHOL 193 12/23/2017   TRIG 54.0 12/23/2017   HDL 83.50 12/23/2017   LDLDIRECT 92.0 11/19/2015   LDLCALC 99 12/23/2017   ALT 70 (H) 08/17/2018   AST 54 (H) 08/17/2018   NA 142 08/17/2018   K 4.1 08/17/2018   CL 107 08/17/2018   CREATININE 1.00 08/17/2018   BUN 26 (H) 08/17/2018   CO2 25 08/17/2018   TSH 2.05 05/10/2018   HGBA1C 5.8 08/17/2018   MICROALBUR 1.8 12/23/2017    US Abdomen Complete  Result Date: 08/23/2018 CLINICAL DATA:  Postprandial epigastric pain for 1 month. EXAM: ABDOMEN ULTRASOUND COMPLETE COMPARISON:  None. FINDINGS: Gallbladder: No gallstones or wall thickening visualized. No sonographic Murphy sign noted by sonographer. Common bile duct: Diameter: 2.2 mm Liver: No focal lesion identified. Within normal limits in parenchymal echogenicity. Portal vein is patent on color Doppler imaging with normal direction of blood flow towards the liver. IVC: No abnormality visualized. Pancreas: Visualized portion unremarkable. Spleen: Size and appearance within normal limits. Right Kidney: Length: 9.8 cm. Echogenicity within normal limits. No hydronephrosis visualized. There is a 1.5 x 1.7 x 0.6 cm complex cystic mass in the midpole region of the right kidney. Left  Kidney: Length: 2.0 cm. Echogenicity within normal limits. No hydronephrosis visualized. There is a 1.8 x 1.5 x 1.6 cm complex cystic mass in the lower pole of the left kidney. Abdominal aorta: No aneurysm visualized. Atherosclerotic calcifications noted. Other findings: None. IMPRESSION: Bilateral renal complex cystic masses. These may represent complicated cysts, but short-term, six-month follow-up is recommended to assure stability. Otherwise, no sonographic abnormalities within the abdomen. Electronically Signed   By: Fidela Salisbury M.D.   On: 08/23/2018 14:04    Assessment & Plan:   Problem List Items Addressed This Visit    Solitary pulmonary nodule    Her last CT was done in October 2018 .  Nodules are stable per report: IMPRESSION: 1. Interval stability of small solid pulmonary nodules in the left lower lobe, considered benign. 2. No active cardiopulmonary disease. 3. Aberrant right subclavian artery.      Prediabetes - Primary  A1c has improved. . Continue mediterranean diet .  A total of 25 minutes of face to face time was spent with patient,  more than half of which was spent in counselling about   Her diet  and coordination of care   Lab Results  Component Value Date   HGBA1C 5.8 08/17/2018            Relevant Orders   Hemoglobin A1c   Comprehensive metabolic panel   Hypothyroidism following radioiodine therapy    Thyroid function is WNL on current dose.  No current changes needed.   Lab Results  Component Value Date   TSH 2.05 05/10/2018   .lastsh        Other Visit Diagnoses    Weight loss       Relevant Orders   TSH   T4, free   Mixed hyperlipidemia       Relevant Medications   ASPIRIN 81 PO   Other Relevant Orders   Lipid panel   Paget's disease of bone       Relevant Orders   Alkaline phosphatase, isoenzymes     A total of 25 minutes of face to face time was spent with patient more than half of which was spent in counselling about the  above mentioned conditions  and coordination of care  I have discontinued Cambri B. Hollenback "[name]"'s ipratropium and pantoprazole. I am also having her maintain her SYRINGE 3CC/27GX1-1/4", cholecalciferol, Cyanocobalamin (VITAMIN B 12 PO), cyanocobalamin, traMADol, tretinoin, Lifitegrast, promethazine-dextromethorphan, telmisartan, SYNTHROID, and ASPIRIN 81 PO. We will continue to administer sodium chloride.  No orders of the defined types were placed in this encounter.   Medications Discontinued During This Encounter  Medication Reason  . ipratropium (ATROVENT) 0.03 % nasal spray   . pantoprazole (PROTONIX) 40 MG tablet     Follow-up: No follow-ups on file.   Crecencio Mc, MD

## 2018-11-11 NOTE — Patient Instructions (Signed)
I'm glad you are gaining weight.  This is a good sign that there is nothing sinister going on  We are repeating your thyroid studies along with liver enzymes etc  In late february  A 4 hour fast is all you need

## 2018-11-13 DIAGNOSIS — R3129 Other microscopic hematuria: Secondary | ICD-10-CM | POA: Insufficient documentation

## 2018-11-13 NOTE — Assessment & Plan Note (Signed)
Her last CT was done in October 2018 .  Nodules are stable per report: IMPRESSION: 1. Interval stability of small solid pulmonary nodules in the left lower lobe, considered benign. 2. No active cardiopulmonary disease. 3. Aberrant right subclavian artery.

## 2018-11-13 NOTE — Assessment & Plan Note (Signed)
Well controlled on low dose telmisartan  Renal function stable, no changes today.  Lab Results  Component Value Date   CREATININE 1.00 08/17/2018   Lab Results  Component Value Date   NA 142 08/17/2018   K 4.1 08/17/2018   CL 107 08/17/2018   CO2 25 08/17/2018

## 2018-11-13 NOTE — Assessment & Plan Note (Signed)
A1c has improved. . Continue mediterranean diet .  A total of 25 minutes of face to face time was spent with patient,  more than half of which was spent in counselling about   Her diet  and coordination of care   Lab Results  Component Value Date   HGBA1C 5.8 08/17/2018

## 2018-11-13 NOTE — Assessment & Plan Note (Signed)
Thyroid function is WNL on current dose.  No current changes needed.   Lab Results  Component Value Date   TSH 2.05 05/10/2018   .lastsh

## 2018-11-16 ENCOUNTER — Other Ambulatory Visit: Payer: Self-pay | Admitting: Internal Medicine

## 2018-11-24 ENCOUNTER — Other Ambulatory Visit: Payer: Medicare Other

## 2018-11-26 ENCOUNTER — Other Ambulatory Visit (INDEPENDENT_AMBULATORY_CARE_PROVIDER_SITE_OTHER): Payer: Medicare Other

## 2018-11-26 DIAGNOSIS — E782 Mixed hyperlipidemia: Secondary | ICD-10-CM | POA: Diagnosis not present

## 2018-11-26 DIAGNOSIS — R634 Abnormal weight loss: Secondary | ICD-10-CM | POA: Diagnosis not present

## 2018-11-26 DIAGNOSIS — M889 Osteitis deformans of unspecified bone: Secondary | ICD-10-CM

## 2018-11-26 DIAGNOSIS — R7303 Prediabetes: Secondary | ICD-10-CM | POA: Diagnosis not present

## 2018-11-26 LAB — LIPID PANEL
Cholesterol: 182 mg/dL (ref 0–200)
HDL: 81.5 mg/dL (ref >39.00)
LDL Cholesterol: 84 mg/dL (ref 0–99)
NonHDL: 100.31
Total CHOL/HDL Ratio: 2
Triglycerides: 81 mg/dL (ref 0.0–149.0)
VLDL: 16.2 mg/dL (ref 0.0–40.0)

## 2018-11-26 LAB — COMPREHENSIVE METABOLIC PANEL
ALT: 29 U/L (ref 0–35)
AST: 21 U/L (ref 0–37)
Albumin: 4.4 g/dL (ref 3.5–5.2)
Alkaline Phosphatase: 71 U/L (ref 39–117)
BUN: 31 mg/dL — ABNORMAL HIGH (ref 6–23)
CO2: 28 mEq/L (ref 19–32)
Calcium: 9.3 mg/dL (ref 8.4–10.5)
Chloride: 108 mEq/L (ref 96–112)
Creatinine, Ser: 0.93 mg/dL (ref 0.40–1.20)
GFR: 59.55 mL/min — ABNORMAL LOW (ref >60.00)
Glucose, Bld: 95 mg/dL (ref 70–99)
Potassium: 4.1 mEq/L (ref 3.5–5.1)
Sodium: 144 mEq/L (ref 135–145)
Total Bilirubin: 0.6 mg/dL (ref 0.2–1.2)
Total Protein: 6.3 g/dL (ref 6.0–8.3)

## 2018-11-26 LAB — T4, FREE: Free T4: 0.72 ng/dL (ref 0.60–1.60)

## 2018-11-26 LAB — TSH: TSH: 2.63 u[IU]/mL (ref 0.35–4.50)

## 2018-11-26 LAB — HEMOGLOBIN A1C: Hgb A1c MFr Bld: 5.8 % (ref 4.6–6.5)

## 2018-11-26 NOTE — Addendum Note (Signed)
Addended by: Leeanne Rio on: 11/26/2018 08:14 AM   Modules accepted: Orders

## 2018-11-29 LAB — ALKALINE PHOSPHATASE, ISOENZYMES
Alkaline Phosphatase: 80 IU/L (ref 39–117)
BONE FRACTION: 38 % (ref 14–68)
INTESTINAL FRAC.: 4 % (ref 0–18)
LIVER FRACTION: 58 % (ref 18–85)

## 2018-12-06 ENCOUNTER — Encounter: Payer: Self-pay | Admitting: Internal Medicine

## 2018-12-07 ENCOUNTER — Other Ambulatory Visit: Payer: Self-pay | Admitting: Internal Medicine

## 2018-12-07 ENCOUNTER — Telehealth: Payer: Self-pay

## 2018-12-07 DIAGNOSIS — M899 Disorder of bone, unspecified: Secondary | ICD-10-CM

## 2018-12-07 DIAGNOSIS — M949 Disorder of cartilage, unspecified: Secondary | ICD-10-CM

## 2018-12-07 DIAGNOSIS — M8580 Other specified disorders of bone density and structure, unspecified site: Secondary | ICD-10-CM

## 2018-12-07 NOTE — Telephone Encounter (Signed)
-----   Message from Philemon Kingdom, MD sent at 12/07/2018 12:10 PM EDT ----- M, I ordered a DEXA scan for this pt to be done at [facility] - after 12/17/2018. Can you please schedule this for her and let her know? Ty, C

## 2018-12-09 ENCOUNTER — Other Ambulatory Visit: Payer: Self-pay | Admitting: Internal Medicine

## 2018-12-21 ENCOUNTER — Ambulatory Visit (INDEPENDENT_AMBULATORY_CARE_PROVIDER_SITE_OTHER)
Admission: RE | Admit: 2018-12-21 | Discharge: 2018-12-21 | Disposition: A | Discharge status: Home / Self Care | Payer: Medicare Other | Source: Ambulatory Visit | Attending: Internal Medicine | Admitting: Internal Medicine

## 2018-12-21 ENCOUNTER — Other Ambulatory Visit: Payer: Self-pay

## 2018-12-21 DIAGNOSIS — M949 Disorder of cartilage, unspecified: Secondary | ICD-10-CM

## 2018-12-21 DIAGNOSIS — M899 Disorder of bone, unspecified: Secondary | ICD-10-CM | POA: Diagnosis not present

## 2018-12-21 DIAGNOSIS — M8580 Other specified disorders of bone density and structure, unspecified site: Secondary | ICD-10-CM

## 2018-12-29 ENCOUNTER — Other Ambulatory Visit: Payer: Self-pay

## 2018-12-30 ENCOUNTER — Other Ambulatory Visit: Payer: Self-pay

## 2018-12-30 ENCOUNTER — Encounter: Payer: Self-pay | Admitting: Internal Medicine

## 2018-12-30 ENCOUNTER — Ambulatory Visit (INDEPENDENT_AMBULATORY_CARE_PROVIDER_SITE_OTHER): Payer: Medicare Other | Admitting: Internal Medicine

## 2018-12-30 VITALS — BP 90/60 | HR 70 | Temp 97.7°F | Ht 63.5 in | Wt 90.4 lb

## 2018-12-30 DIAGNOSIS — M8889 Osteitis deformans of multiple sites: Secondary | ICD-10-CM

## 2018-12-30 DIAGNOSIS — R7303 Prediabetes: Secondary | ICD-10-CM

## 2018-12-30 DIAGNOSIS — E890 Postprocedural hypothyroidism: Secondary | ICD-10-CM | POA: Diagnosis not present

## 2018-12-30 DIAGNOSIS — M8580 Other specified disorders of bone density and structure, unspecified site: Secondary | ICD-10-CM | POA: Diagnosis not present

## 2018-12-30 LAB — VITAMIN D 25 HYDROXY (VIT D DEFICIENCY, FRACTURES): VITD: 55.06 ng/mL (ref 30.00–100.00)

## 2018-12-30 NOTE — Patient Instructions (Signed)
Please stop at the lab.  Please come back for a follow-up appointment in 6 months.  Denosumab: Patient drug information (Up-to-date) Copyright 9521399457 [employer] All rights reserved.  Brand Names: [place]  ProliaRivka Barbara What is this drug used for?  .It is used to treat soft, brittle bones (osteoporosis).  .It is used for bone growth.  .It is used when treating some cancers.  .It may be given to you for other reasons. Talk with the doctor. What do I need to tell my doctor BEFORE I take this drug?  All products:  .If you have an allergy to denosumab or any other part of this drug.  .If you are allergic to any drugs like this one, any other drugs, foods, or other substances. Tell your doctor about the allergy and what signs you had, like rash; hives; itching; shortness of breath; wheezing; cough; swelling of face, lips, tongue, or throat; or any other signs.  .If you have low calcium levels.  ProliaT:  .If you are pregnant or may be pregnant. Do not take this drug if you are pregnant.  This is not a list of all drugs or health problems that interact with this drug.  Tell your doctor and pharmacist about all of your drugs (prescription or OTC, natural products, vitamins) and health problems. You must check to make sure that it is safe for you to take this drug with all of your drugs and health problems. Do not start, stop, or change the dose of any drug without checking with your doctor. What are some things I need to know or do while I take this drug?  All products:  .Tell dentists, surgeons, and other doctors that you use this drug.  .This drug may raise the chance of a broken leg. Talk with your doctor.  .Have your blood work checked. Talk with your doctor.  .Have a bone density test. Talk with your doctor.  .Take calcium and vitamin D as you were told by your doctor.  .Have a dental exam before starting this drug.  .Take good care of your teeth. See a dentist often.  .If you  smoke, talk with your doctor.  .Do not give to a child. Talk with your doctor.  .Tell your doctor if you are breast-feeding. You will need to talk about any risks to your baby.  Xgeva:  .This drug may cause harm to the unborn baby if you take it while you are pregnant. If you get pregnant while taking this drug, call your doctor right away.  ProliaT:  .Very bad infections have been reported with use of this drug. If you have any infection, are taking antibiotics now or in the recent past, or have many infections, talk with your doctor.  .You may have more chance of getting an infection. Wash hands often. Stay away from people with infections, colds, or flu.  .Use birth control that you can trust to prevent pregnancy while taking this drug.  .If you are a man and your sex partner is pregnant or gets pregnant at any time while you are being treated, talk with your doctor. What are some side effects that I need to call my doctor about right away?  WARNING/CAUTION: Even though it may be rare, some people may have very bad and sometimes deadly side effects when taking a drug. Tell your doctor or get medical help right away if you have any of the following signs or symptoms that may be related to a very  bad side effect:  All products:  .Signs of an allergic reaction, like rash; hives; itching; red, swollen, blistered, or peeling skin with or without fever; wheezing; tightness in the chest or throat; trouble breathing or talking; unusual hoarseness; or swelling of the mouth, face, lips, tongue, or throat.  .Signs of low calcium levels like muscle cramps or spasms, numbness and tingling, or seizures.  .Mouth sores.  .Any new or strange groin, hip, or thigh pain.  .This drug may cause jawbone problems. The chance may be higher the longer you take this drug. The chance may be higher if you have cancer, dental problems, dentures that do not fit well, anemia, blood clotting problems, or an infection. The  chance may also be higher if you are having dental work or if you are getting chemo, some steroid drugs, or radiation. Call your doctor right away if you have jaw swelling or pain.  Xgeva:  .Not hungry.  .Muscle pain or weakness.  .Seizures.  .Shortness of breath.  ProliaT:  .Signs of infection. These include a fever of 100.69F (38C) or higher, chills, very bad sore throat, ear or sinus pain, cough, more sputum or change in color of sputum, pain with passing urine, mouth sores, wound that will not heal, or anal itching or pain.  .Signs of a pancreas problem (pancreatitis) like very bad stomach pain, very bad back pain, or very bad upset stomach or throwing up.  .Chest pain.  .A heartbeat that does not feel normal.  .Very bad skin irritation.  .Feeling very tired or weak.  .Bladder pain or pain when passing urine or change in how much urine is passed.  .Passing urine often.  .Swelling in the arms or legs. What are some other side effects of this drug?  All drugs may cause side effects. However, many people have no side effects or only have minor side effects. Call your doctor or get medical help if any of these side effects or any other side effects bother you or do not go away:  Xgeva:  .Feeling tired or weak.  Marland KitchenHeadache.  Marland KitchenUpset stomach or throwing up.  .Loose stools (diarrhea).  .Cough.  ProliaT:  .Back pain.  .Muscle or joint pain.  .Sore throat.  .Runny nose.  .Pain in arms or legs.  These are not all of the side effects that may occur. If you have questions about side effects, call your doctor. Call your doctor for medical advice about side effects.  You may report side effects to your national health agency. How is this drug best taken?  Use this drug as ordered by your doctor. Read and follow the dosing on the label closely.  .It is given as a shot into the fatty part of the skin. What do I do if I miss a dose?  .Call the doctor to find out what to do. How do I store  and/or throw out this drug?  Marland KitchenThis drug will be given to you in a hospital or doctor's office. You will not store it at home.  Marland KitchenKeep all drugs out of the reach of children and pets.  .Check with your pharmacist about how to throw out unused drugs.  General drug facts  .If your symptoms or health problems do not get better or if they become worse, call your doctor.  .Do not share your drugs with others and do not take anyone else's drugs.  Marland KitchenKeep a list of all your drugs (prescription, natural products, vitamins, OTC) with you. Give this  list to your doctor.  .Talk with the doctor before starting any new drug, including prescription or OTC, natural products, or vitamins.  .Some drugs may have another patient information leaflet. If you have any questions about this drug, please talk with your doctor, pharmacist, or other health care provider.  .If you think there has been an overdose, call your poison control center or get medical care right away. Be ready to tell or show what was taken, how much, and when it happened.

## 2018-12-30 NOTE — Progress Notes (Signed)
Patient ID: TAUHEEDAH BOK, female   DOB: 11-Feb-1946, 73 y.o.   MRN: [id number]   HPI  EVELISSE SZALKOWSKI is a 73 y.o.-year-old female, returning for follow-up for Paget ds, osteopenia, postablative hypothyroidism, prediabetes.  Last visit 6 months ago.  Reviewed history: She had a CTA abd/pelvis on 06/2015:incidental lesion:  Diffuse patchy sclerosis and cortical thickening of the left iliac bone, favored to represent Paget's disease.  She has had an anteverted L femur since her 54s. She has had pain in that his ever since.    Bone scan (01/02/2016): FINDINGS: There is asymmetric increased uptake throughout the left iliac bone. The appearance and distribution is compatible with Paget's disease. Increased uptake in the distribution of the nasal bone is also noted, nonspecific. This could be seen with Paget's disease, fibrous dysplasia or chronic sinusitis. No findings identified to suggest bone metastases. Physiologic uptake is noted within the kidneys and urinary bladder.  IMPRESSION: 1. Increased uptake within the left iliac bone compatible with Paget's disease. 2. Central area of increased uptake in the region of the nasal bone which may also reflect Paget's disease of the skull.  Reviewed her alkaline phosphatase levels-stable, normal, at last check: Lab Results  Component Value Date   ALKPHOS 80 11/26/2018   ALKPHOS 71 11/26/2018   ALKPHOS 130 (H) 08/17/2018   ALKPHOS 78 05/10/2018   ALKPHOS 71 12/23/2017   ALKPHOS 82 07/13/2017   ALKPHOS 87 09/18/2016   ALKPHOS 73 11/19/2015   ALKPHOS 96 08/14/2014   ALKPHOS 88 07/06/2014    Osteopenia:  Date L1-L4 T score FN T scores FRAX score  12/21/2018 ([place]) -0.5 (-1.6%) RFN: -2.4 LFN: -2.2  (-3%) 10-year MOF: 10.8% 10-year hip fracture risk: 3%  12/16/2016 ([place]) -0.40 RFN:-2.2 LFN:-2.1   01/22/2012 ([facility])  -0.60 LFN: -0.66     No recent fractures or falls.  She had a traumatic vertebral fracture as a child.  She has  a history of dizziness, but not recently.  She has been on the following osteoporosis treatments: - Actonel for 2 years. This was apparently stopped due to recurrent kidney stones -last episode 2010.  She did not feel good on this (stomach upset, weakness, fatigue).  Latest vitamin D level was normal: Lab Results  Component Value Date   VD25OH 60.99 02/02/2018   VD25OH 55.42 07/13/2017   VD25OH 49.37 12/12/2016   VD25OH 49.43 11/19/2015   VD25OH 46.46 08/14/2014   VD25OH 62 08/29/2013   VD25OH 21 (L) 01/20/2012   She is on 1000 units vitamin D daily  She eats dairy and green leafy vegetables.  She walks for exercise.  She had surgical menopause at 73 years old.   Pt does not have a FH of osteoporosis.  No history of hypo- or hyper-calcemia and no hyperparathyroidism: Lab Results  Component Value Date   CALCIUM 9.3 11/26/2018   CALCIUM 9.8 08/17/2018   CALCIUM 10.2 05/10/2018   CALCIUM 9.7 02/02/2018   CALCIUM 9.6 12/23/2017   CALCIUM 9.5 12/04/2017   CALCIUM 9.1 10/09/2017   CALCIUM 10.0 07/13/2017   CALCIUM 9.8 09/18/2016   CALCIUM 9.8 11/19/2015   She has a history of Graves' disease, status post RAI treatment in the 71s.  She then developed post ablative hypothyroidism.  Pt is on Synthroid d.a.w. 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; latest BUN/creatinine reviewed: Lab Results  Component Value Date   BUN 31 (H) 11/26/2018   CREATININE 0.93 11/26/2018   Prediabetes: Reviewed her HbA1c levels and they fluctuate between normal and prediabetes range: Lab Results  Component Value Date   HGBA1C 5.8 11/26/2018   HGBA1C 5.8 08/17/2018   HGBA1C 6.2 05/10/2018   HGBA1C 5.3 12/21/2017    HGBA1C 5.9 12/20/2017   HGBA1C 5.3 12/19/2017   HGBA1C 5.3 12/19/2017   HGBA1C 5.3 12/18/2017   HGBA1C 5.3 12/18/2017   HGBA1C 5.9 07/13/2017   Reviewed sugars at home: - am: 78-93, 123 >> 73, 80s - 2h after b'fast: 92-120 >> highest 120 - lunch: 94, 103 >> n/c - 2h after lunch: 102-113, 123 >> n/c - dinner: 93-108 >> n/c - 2h after dinner: 98, 116 >> n/c - bedtime: n/c  At last visit, We checked her for type 1 diabetes and the investigation was negative: Component     Latest Ref Rng & Units 06/30/2018  C-Peptide     0.80 - 3.85 ng/mL 2.45  Glucose, Plasma     65 - 99 mg/dL 93  ZNT8 Antibodies      negative  Glutamic Acid Decarb Ab     <5 IU/mL <5  Islet Cell Ab     Neg:<1:1 Negative    ROS: Constitutional: no weight gain/no weight loss, no fatigue, no subjective hyperthermia, no subjective hypothermia Eyes: no blurry vision, no xerophthalmia ENT: no sore throat, no nodules palpated in neck, no dysphagia, no odynophagia, no hoarseness Cardiovascular: no CP/no SOB/no palpitations/no leg swelling Respiratory: no cough/no SOB/no wheezing Gastrointestinal: no N/no V/no D/no C/no acid reflux Musculoskeletal: no muscle aches/no joint aches Skin: no rashes, no hair loss Neurological: no tremors/no numbness/no tingling/no dizziness  I reviewed pt's medications, allergies, PMH, social hx, family hx, and changes were documented in the history of present illness. Otherwise, unchanged from my initial visit note.  Past Medical History:  Diagnosis Date  . Aberrant right subclavian artery   . Cataract    surgery  . Clotting disorder (HCC)    hx of bleeding after vaginal delivery, dr told her not to have any surgeries that are necessary  . Dysplasia, artery, fibromuscular, renal ([place])   . Fibromuscular dysplasia ([place])   . Fibromuscular dysplasia of renal artery (HCC)    managed by Dr. Greg[facility]  . Graves disease   . Heart murmur   . Heart murmur   . Hypertension    . Kidney stone   . Migraines   . Neuropathy with IgA monoclonal gammopathy (HCC)    Dr Mervyn Skeeters  . Paget's disease of the bone   . Pernicious anemia   . Thyroid disease   . Urinary incontinence, mixed    resolved with removal of ained sutures in bladder    Past Surgical History:  Procedure Laterality Date  . BILATERAL SALPINGOOPHORECTOMY    . CATARACT EXTRACTION  Sep 10, 2011, Jan 13,2013  . COLONOSCOPY    . RENAL BIOPSY    . TONSILLECTOMY    . VAGINAL DELIVERY     x2  . VAGINAL HYSTERECTOMY    . WISDOM TOOTH EXTRACTION     Social History   Socioeconomic History  . Marital status: Married    Spouse name: Not on file  .  Number of children: 2  . Years of education: Not on file  . Highest education level: Not on file  Occupational History  . Occupation: Network engineer for [employer]  . Financial resource strain: Not hard at all  . Food insecurity:    Worry: Never true    Inability: Never true  . Transportation needs:    Medical: No    Non-medical: No  Tobacco Use  . Smoking status: Never Smoker  . Smokeless tobacco: Never Used  Substance and Sexual Activity  . Alcohol use: Yes    Comment: once a year  . Drug use: No  . Sexual activity: Not Currently  Lifestyle  . Physical activity:    Days per week: Not on file    Minutes per session: Not on file  . Stress: Not on file  Relationships  . Social connections:    Talks on phone: Not on file    Gets together: Not on file    Attends religious service: Not on file    Active member of club or organization: Not on file    Attends meetings of clubs or organizations: Not on file    Relationship status: Not on file  . Intimate partner violence:    Fear of current or ex partner: No    Emotionally abused: No    Physically abused: No    Forced sexual activity: No  Other Topics Concern  . Not on file  Social History Narrative  . Not on file   Current Outpatient Medications on File Prior to Visit  Medication  Sig Dispense Refill  . ASPIRIN 81 PO     . cholecalciferol (VITAMIN D) 1000 UNITS tablet Take 1,000 Units by mouth daily.    . cyanocobalamin (,VITAMIN B-12,) 1000 MCG/ML injection INJECT 1 ML IM EVERY MONTH 10 mL 3  . Cyanocobalamin (VITAMIN B 12 PO) Take by mouth.    Marland Kitchen Lifitegrast (XIIDRA) 5 % SOLN Apply 1 drop to eye 2 (two) times daily.    . promethazine-dextromethorphan (PROMETHAZINE-DM) 6.25-15 MG/5ML syrup Take 5 mLs by mouth 4 (four) times daily as needed for cough. (Patient not taking: Reported on 11/11/2018) 180 mL 1  . SYNTHROID 50 MCG tablet TAKE ONE TABLET ON AN EMPTY STOMACH WITHA GLASS OF WATER AT LEAST 30 TO 60 MINUTES BEFORE BREAKFAST 90 tablet 0  . Syringe/Needle, Disp, (SYRINGE 3CC/27GX1-1/4") 27G X 1-1/4" 3 ML MISC For B12 injections 100 each 0  . telmisartan (MICARDIS) 20 MG tablet TAKE ONE-HALF A TABLET BY MOUTH EVERY DAY 45 tablet 0  . traMADol (ULTRAM) 50 MG tablet Take 1 tablet (50 mg total) by mouth every 8 (eight) hours as needed. 30 tablet 3  . tretinoin (RETIN-A) 0.025 % cream Apply topically at bedtime. 45 g 1   Current Facility-Administered Medications on File Prior to Visit  Medication Dose Route Frequency Provider Last Rate Last Dose  . 0.9 %  sodium chloride infusion  500 mL Intravenous Continuous Pyrtle, Lajuan Lines, MD       Allergies  Allergen Reactions  . Voltaren [Diclofenac] Hypertension  . Adhesive [Tape]   . Demerol     HYPOTENSION  . Macrodantin   . Nsaids Hypertension  . Voltaren [Diclofenac Sodium] Other (See Comments)    FMD, increased bp  . Codeine Nausea And Vomiting and Rash  . Nickel Rash  . Penicillins Rash  . Sulfa Antibiotics Rash    Fever   Family History  Problem Relation Age of Onset  .  Anuerysm Mother   . COPD Mother   . Hypertension Mother   . Heart disease Mother   . Heart failure Mother   . Cancer Father        lung CA  . Heart disease Father   . Heart attack Father   . Heart disease Brother        viral cardiomyopathy,  cocaine use  . Drug abuse Brother   . Cancer Maternal Aunt        esophageal and live rCA,  hisotory of  ETOH and tobacco  . Anuerysm Maternal Grandfather   . Cancer Paternal Grandmother        brain tumor  . Diabetes Sister   . Colon cancer Paternal Uncle    PE: There were no vitals taken for this visit. There is no height or weight on file to calculate BMI. Wt Readings from Last 3 Encounters:  11/11/18 88 lb 12.8 oz (40.3 kg)  06/30/18 91 lb (41.3 kg)  06/24/18 92 lb 4 oz (41.8 kg)   Constitutional: thin, in NAD Eyes: PERRLA, EOMI, no exophthalmos ENT: moist mucous membranes, no thyromegaly, no cervical lymphadenopathy Cardiovascular: RRR, No MRG Respiratory: CTA B Gastrointestinal: abdomen soft, NT, ND, BS+ Musculoskeletal: no deformities, strength intact in all 4 Skin: moist, warm, no rashes Neurological: no tremor with outstretched hands, DTR normal in all 4  Assessment: 1. Paget disease  2. Osteopenia  3. Postablative hypothyroidism - RAI tx for Graves ds. In 1980s  4.  Prediabetes  Plan: 1. Paget ds. -The lesion in the left hip was observed incidentally on pelvic CTA at [facility].  This was then again noticed on the bone scan from 2017, along with a nasal focus. -No pain or warmth at the site of her pagetoid wounds -We discussed at previous visits that Paget's disease lesions do not need to be treated unless they are painful, contiguous with the joint, or in weightbearing joints.  Severe complications like bone cancer, fractures, heart failure, are extremely rare nowadays. -We reviewed together her latest alkaline phosphatase level, which was normal x2 in 10/2018.  She had one level that was higher in 07/2018. -She would like to avoid bisphosphonates due to previous intolerance -No intervention is needed for now (although, we plan to start Prolia for osteopenia), we will continue to monitor her clinically and by alkaline phosphatase.  She will need another bone scan if  this trends up.  2. Osteopenia -Reviewed latest bone density test showing slightly worse bone density scores at the femoral necks (however, this did not reach statistical significance) -Her FRAX score reveals a 10-year higher risk for hip fracture, so at this visit, I did suggest anti-osteoporotic treatment.  We discussed about different medication classes, mechanism of action, and possible side effects.  She agrees to try Prolia.  We will start a PA for this after the results return. -Latest vitamin D level was normal in 01/2018-we will recheck this today  3. Postablative hypothyroidism - latest thyroid labs reviewed with pt >> normal 10/2018 - she continues on LT4 50 mcg daily - pt feels good on this dose. - we discussed about taking the thyroid hormone every day, with water, >30 minutes before breakfast, separated by >4 hours from acid reflux medications, calcium, iron, multivitamins. Pt. is taking it correctly.  4.  Prediabetes -Her HbA1c fluctuates between the normal range in the prediabetic range.  Before last visit, she cut out fast foods, concentrated sweets and some starches.  She lost weight  and was concerned about type 1 diabetes.  However, we checked her for this at last visit and the investigation was negative. -Before this visit, HbA1c was stable, at 5.8% -Sugars are all at goal at home -We will repeat this when she comes back in 6 months  Component     Latest Ref Rng & Units 12/30/2018  Glucose     65 - 99 mg/dL 88  BUN     7 - 25 mg/dL 22  Creatinine     0.60 - 0.93 mg/dL 0.99 (H)  GFR, Est Non African American     > OR = 60 mL/min/1.48m2 58 (L)  GFR, Est African American     > OR = 60 mL/min/1.4m2 67  BUN/Creatinine Ratio     6 - 22 (calc) 22  Sodium     135 - 146 mmol/L 142  Potassium     3.5 - 5.3 mmol/L 4.8  Chloride     98 - 110 mmol/L 106  CO2     20 - 32 mmol/L 26  Calcium     8.6 - 10.4 mg/dL 9.7  VITD     30.00 - 100.00 ng/mL 55.06   Labs normal,  except GR slightly low. Will go ahead with the plan to start Prolia.  Philemon Kingdom, MD PhD [facility]

## 2018-12-31 LAB — BASIC METABOLIC PANEL WITH GFR
BUN/Creatinine Ratio: 22 (calc) (ref 6–22)
BUN: 22 mg/dL (ref 7–25)
CO2: 26 mmol/L (ref 20–32)
Calcium: 9.7 mg/dL (ref 8.6–10.4)
Chloride: 106 mmol/L (ref 98–110)
Creat: 0.99 mg/dL — ABNORMAL HIGH (ref 0.60–0.93)
GFR, Est African American: 67 mL/min/{1.73_m2} (ref >=60)
GFR, Est Non African American: 58 mL/min/{1.73_m2} — ABNORMAL LOW (ref >=60)
Glucose, Bld: 88 mg/dL (ref 65–99)
Potassium: 4.8 mmol/L (ref 3.5–5.3)
Sodium: 142 mmol/L (ref 135–146)

## 2019-01-21 ENCOUNTER — Ambulatory Visit: Payer: Medicare Other | Admitting: Internal Medicine

## 2019-01-31 ENCOUNTER — Other Ambulatory Visit: Payer: Self-pay | Admitting: Internal Medicine

## 2019-02-28 DIAGNOSIS — N281 Cyst of kidney, acquired: Secondary | ICD-10-CM | POA: Diagnosis not present

## 2019-03-03 ENCOUNTER — Other Ambulatory Visit: Payer: Self-pay | Admitting: Internal Medicine

## 2019-03-03 IMAGING — DX DG CHEST 2V
2 series · 2 of 2 positions shown · non-contrast
Comparison: None.

CLINICAL DATA: Cough and fever for 3 days.

EXAM:
CHEST  2 VIEW

[chest pa]
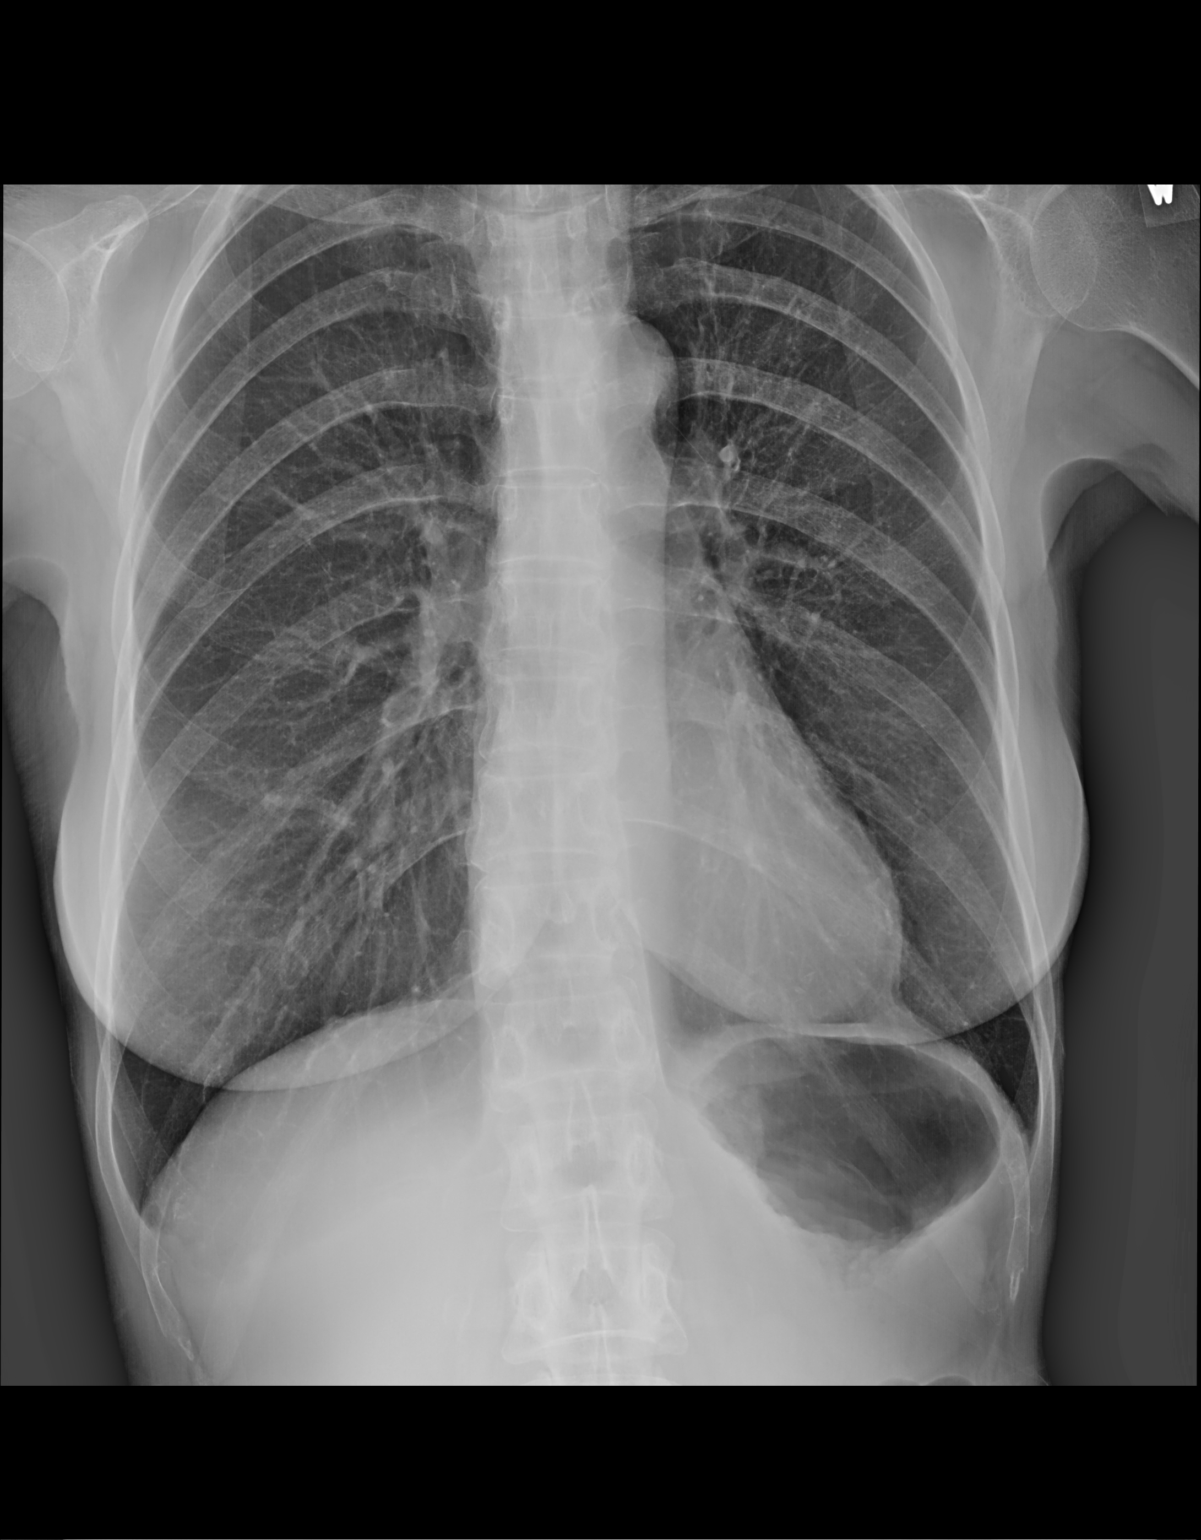

[chest lat]
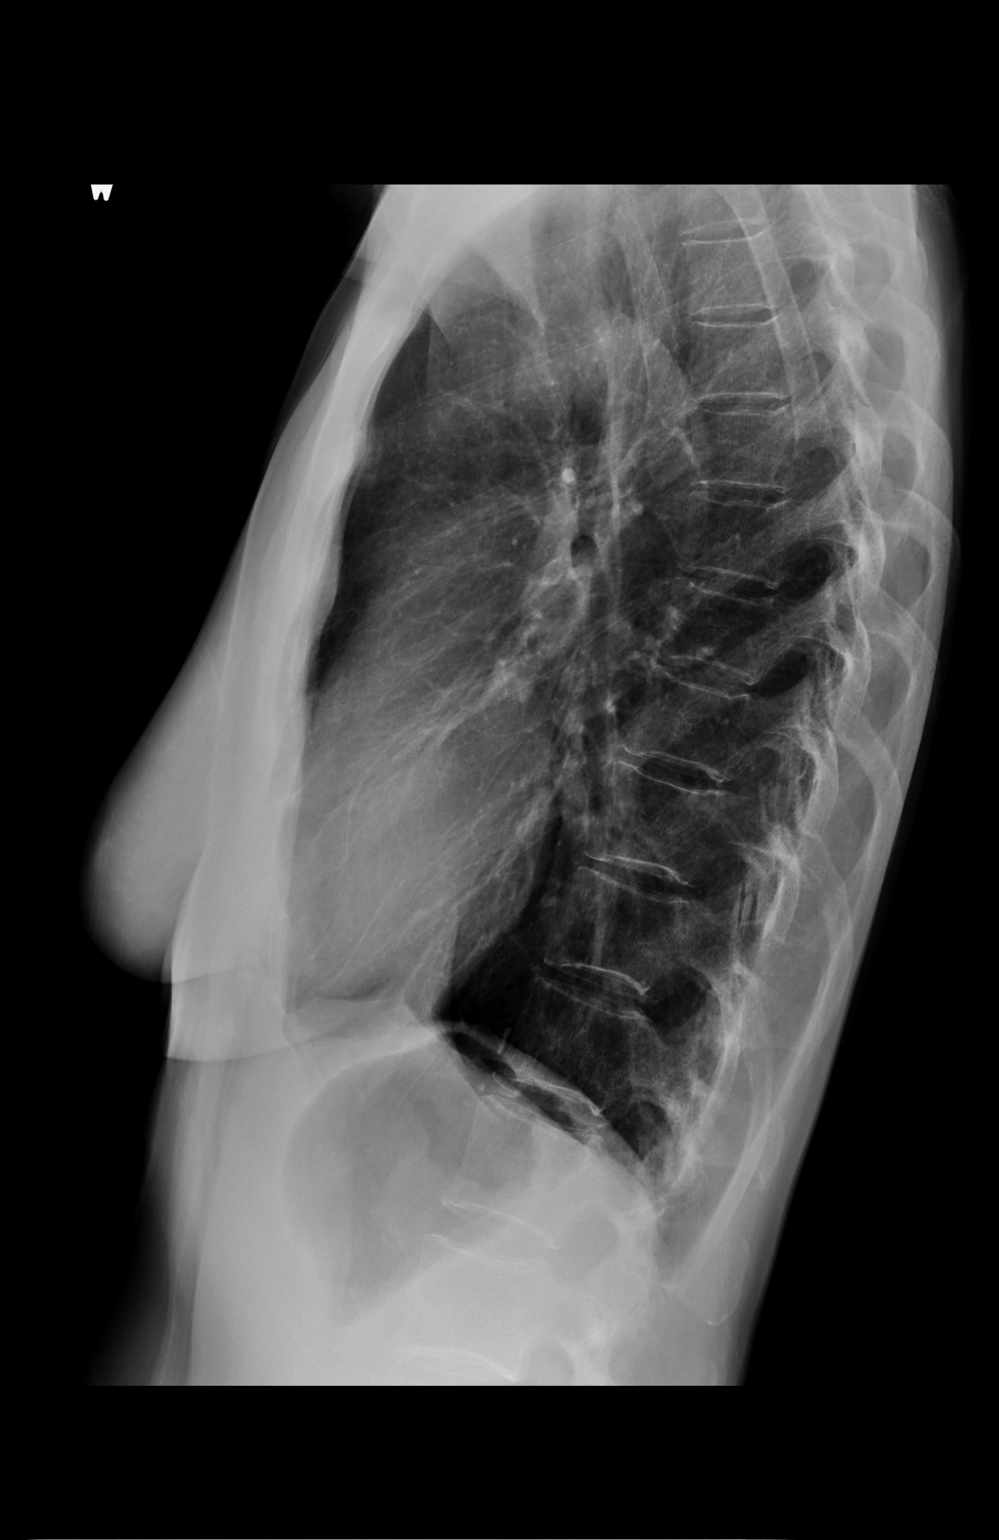

[2 of 2 positions shown; findings below may reference images not displayed]

FINDINGS: The heart size and mediastinal contours are within normal limits.
Both lungs are clear. The visualized skeletal structures are
unremarkable.
IMPRESSION: No active cardiopulmonary disease.

## 2019-03-08 DIAGNOSIS — Z79899 Other long term (current) drug therapy: Secondary | ICD-10-CM | POA: Diagnosis not present

## 2019-03-08 DIAGNOSIS — Z8249 Family history of ischemic heart disease and other diseases of the circulatory system: Secondary | ICD-10-CM | POA: Diagnosis not present

## 2019-03-08 DIAGNOSIS — Q6102 Congenital multiple renal cysts: Secondary | ICD-10-CM | POA: Diagnosis not present

## 2019-03-08 DIAGNOSIS — R3121 Asymptomatic microscopic hematuria: Secondary | ICD-10-CM | POA: Diagnosis not present

## 2019-03-08 DIAGNOSIS — N182 Chronic kidney disease, stage 2 (mild): Secondary | ICD-10-CM | POA: Diagnosis not present

## 2019-03-08 DIAGNOSIS — I773 Arterial fibromuscular dysplasia: Secondary | ICD-10-CM | POA: Diagnosis not present

## 2019-03-08 DIAGNOSIS — Z832 Family history of diseases of the blood and blood-forming organs and certain disorders involving the immune mechanism: Secondary | ICD-10-CM | POA: Diagnosis not present

## 2019-03-08 DIAGNOSIS — Z87440 Personal history of urinary (tract) infections: Secondary | ICD-10-CM | POA: Diagnosis not present

## 2019-03-08 DIAGNOSIS — I129 Hypertensive chronic kidney disease with stage 1 through stage 4 chronic kidney disease, or unspecified chronic kidney disease: Secondary | ICD-10-CM | POA: Diagnosis not present

## 2019-03-16 DIAGNOSIS — Z1231 Encounter for screening mammogram for malignant neoplasm of breast: Secondary | ICD-10-CM | POA: Diagnosis not present

## 2019-03-16 LAB — HM MAMMOGRAPHY

## 2019-04-07 ENCOUNTER — Other Ambulatory Visit: Payer: Self-pay | Admitting: Internal Medicine

## 2019-04-08 NOTE — Telephone Encounter (Signed)
B-12 injections have not been refilled in two years.

## 2019-04-14 DIAGNOSIS — H40013 Open angle with borderline findings, low risk, bilateral: Secondary | ICD-10-CM | POA: Diagnosis not present

## 2019-04-14 DIAGNOSIS — H268 Other specified cataract: Secondary | ICD-10-CM | POA: Diagnosis not present

## 2019-04-22 ENCOUNTER — Telehealth: Payer: Self-pay

## 2019-04-22 NOTE — Telephone Encounter (Signed)
She has a distant h/o vertebral fracture.

## 2019-04-22 NOTE — Telephone Encounter (Signed)
I have re-submitted with this information and I will f/u once I get notification

## 2019-04-22 NOTE — Telephone Encounter (Signed)
I have been unable to get this patient approved for Prolia-after speaking with reps I was told it is because of the Dx code associated-only way to get approval is if patient has had a history of fracture please advise if that is the case so that I can get this resolved

## 2019-05-02 DIAGNOSIS — I773 Arterial fibromuscular dysplasia: Secondary | ICD-10-CM | POA: Diagnosis not present

## 2019-05-02 DIAGNOSIS — Z681 Body mass index (BMI) 19 or less, adult: Secondary | ICD-10-CM | POA: Diagnosis not present

## 2019-05-02 DIAGNOSIS — N281 Cyst of kidney, acquired: Secondary | ICD-10-CM | POA: Diagnosis not present

## 2019-05-12 ENCOUNTER — Ambulatory Visit (INDEPENDENT_AMBULATORY_CARE_PROVIDER_SITE_OTHER): Payer: Medicare Other

## 2019-05-12 ENCOUNTER — Ambulatory Visit (INDEPENDENT_AMBULATORY_CARE_PROVIDER_SITE_OTHER): Payer: Medicare Other | Admitting: Internal Medicine

## 2019-05-12 ENCOUNTER — Encounter: Payer: Self-pay | Admitting: Internal Medicine

## 2019-05-12 ENCOUNTER — Other Ambulatory Visit: Payer: Self-pay

## 2019-05-12 VITALS — BP 122/80 | Ht 63.5 in | Wt 90.4 lb

## 2019-05-12 DIAGNOSIS — E441 Mild protein-calorie malnutrition: Secondary | ICD-10-CM | POA: Diagnosis not present

## 2019-05-12 DIAGNOSIS — R7303 Prediabetes: Secondary | ICD-10-CM | POA: Diagnosis not present

## 2019-05-12 DIAGNOSIS — I129 Hypertensive chronic kidney disease with stage 1 through stage 4 chronic kidney disease, or unspecified chronic kidney disease: Secondary | ICD-10-CM

## 2019-05-12 DIAGNOSIS — N281 Cyst of kidney, acquired: Secondary | ICD-10-CM | POA: Diagnosis not present

## 2019-05-12 DIAGNOSIS — I471 Supraventricular tachycardia: Secondary | ICD-10-CM

## 2019-05-12 DIAGNOSIS — Z0000 Encounter for general adult medical examination without abnormal findings: Secondary | ICD-10-CM

## 2019-05-12 DIAGNOSIS — E279 Disorder of adrenal gland, unspecified: Secondary | ICD-10-CM | POA: Diagnosis not present

## 2019-05-12 DIAGNOSIS — I773 Arterial fibromuscular dysplasia: Secondary | ICD-10-CM | POA: Diagnosis not present

## 2019-05-12 MED ORDER — TRAMADOL HCL 50 MG PO TABS: 50.0000 mg | ORAL_TABLET | Freq: Three times a day (TID) | ORAL | 3 refills | Status: DC | PRN

## 2019-05-12 NOTE — Progress Notes (Addendum)
Subjective:   Hailey Hunt is a 73 y.o. female who presents for Medicare Annual (Subsequent) preventive examination.  Review of Systems:  No ROS.  Medicare Wellness Virtual Visit.  Visual/audio telehealth visit. UTA vital signs.  See social history for additional risk factors.   Cardiac Risk Factors include: advanced age (>65men, >17 women);hypertension     Objective:     Vitals: There were no vitals taken for this visit.  There is no height or weight on file to calculate BMI.  Advanced Directives 05/12/2019 05/10/2018 12/17/2016 05/26/2016  Does Patient Have a Medical Advance Directive? Yes Yes Yes Yes  Type of Paramedic of [place];Living will [place];Living will [place];Living will [place];Living will  Does patient want to make changes to medical advance directive? No - Patient declined No - Patient declined No - Patient declined -  Copy of [place] in Chart? No - copy requested No - copy requested No - copy requested -    Tobacco Social History   Tobacco Use  Smoking Status Never Smoker  Smokeless Tobacco Never Used     Counseling given: Not Answered   Clinical Intake:  Pre-visit preparation completed: Yes        Diabetes: No  How often do you need to have someone help you when you read instructions, pamphlets, or other written materials from your doctor or pharmacy?: 1 - Never  Interpreter Needed?: No     Past Medical History:  Diagnosis Date  . Aberrant right subclavian artery   . Cataract    surgery  . Clotting disorder (HCC)    hx of bleeding after vaginal delivery, dr told her not to have any surgeries that are necessary  . Dysplasia, artery, fibromuscular, renal ([place])   . Fibromuscular dysplasia ([place])   . Fibromuscular dysplasia of renal artery (HCC)    managed by Dr. Gregary Signs Nephrology  . Graves disease   . Heart murmur   . Heart  murmur   . Hypertension   . Kidney stone   . Migraines   . Neuropathy with IgA monoclonal gammopathy (HCC)    Dr Mervyn Skeeters  . Paget's disease of the bone   . Pernicious anemia   . Thyroid disease   . Urinary incontinence, mixed    resolved with removal of ained sutures in bladder    Past Surgical History:  Procedure Laterality Date  . BILATERAL SALPINGOOPHORECTOMY    . CATARACT EXTRACTION  Sep 10, 2011, Jan 13,2013  . COLONOSCOPY    . RENAL BIOPSY    . TONSILLECTOMY    . VAGINAL DELIVERY     x2  . VAGINAL HYSTERECTOMY    . WISDOM TOOTH EXTRACTION     Family History  Problem Relation Age of Onset  . Anuerysm Mother   . COPD Mother   . Hypertension Mother   . Heart disease Mother   . Heart failure Mother   . Cancer Father        lung CA  . Heart disease Father   . Heart attack Father   . Heart disease Brother        viral cardiomyopathy, cocaine use  . Drug abuse Brother   . Cancer Maternal Aunt        esophageal and live rCA,  hisotory of  ETOH and tobacco  . Anuerysm Maternal Grandfather   . Cancer Paternal Grandmother  brain tumor  . Diabetes Sister   . Colon cancer Paternal Uncle    Social History   Socioeconomic History  . Marital status: Married    Spouse name: Not on file  . Number of children: 2  . Years of education: Not on file  . Highest education level: Not on file  Occupational History  . Occupation: Network engineer for [employer]  . Financial resource strain: Not hard at all  . Food insecurity    Worry: Never true    Inability: Never true  . Transportation needs    Medical: No    Non-medical: No  Tobacco Use  . Smoking status: Never Smoker  . Smokeless tobacco: Never Used  Substance and Sexual Activity  . Alcohol use: Yes    Comment: once a year  . Drug use: No  . Sexual activity: Not Currently  Lifestyle  . Physical activity    Days per week: 7 days    Minutes per session: Not on file  . Stress: Not at all  Relationships   . Social Herbalist on phone: Not on file    Gets together: Not on file    Attends religious service: Not on file    Active member of club or organization: Not on file    Attends meetings of clubs or organizations: Not on file    Relationship status: Married  Other Topics Concern  . Not on file  Social History Narrative  . Not on file    Outpatient Encounter Medications as of 05/12/2019  Medication Sig  . ASPIRIN 81 PO   . cholecalciferol (VITAMIN D) 1000 UNITS tablet Take 1,000 Units by mouth daily.  . cyanocobalamin (,VITAMIN B-12,) 1000 MCG/ML injection AS DIRECTED  . Cyanocobalamin (VITAMIN B 12 PO) Take by mouth.  . SYNTHROID 50 MCG tablet TAKE ONE TABLET ON AN EMPTY STOMACH WITHA GLASS OF WATER AT LEAST 30 TO 60 MINUTES BEFORE BREAKFAST  . Syringe/Needle, Disp, (SYRINGE 3CC/27GX1-1/4") 27G X 1-1/4" 3 ML MISC For B12 injections  . telmisartan (MICARDIS) 20 MG tablet TAKE 1/2 TABLET BY MOUTH DAILY  . traMADol (ULTRAM) 50 MG tablet Take 1 tablet (50 mg total) by mouth every 8 (eight) hours as needed.  . tretinoin (RETIN-A) 0.025 % cream APPLY TOPICALLY AT BEDTIME AS DIRECTED  . promethazine-dextromethorphan (PROMETHAZINE-DM) 6.25-15 MG/5ML syrup Take 5 mLs by mouth 4 (four) times daily as needed for cough. (Patient not taking: Reported on 05/12/2019)  . [DISCONTINUED] Lifitegrast (XIIDRA) 5 % SOLN Apply 1 drop to eye 2 (two) times daily.   Facility-Administered Encounter Medications as of 05/12/2019  Medication  . 0.9 %  sodium chloride infusion    Activities of Daily Living In your present state of health, do you have any difficulty performing the following activities: 05/12/2019  Hearing? N  Vision? N  Difficulty concentrating or making decisions? N  Walking or climbing stairs? N  Dressing or bathing? N  Doing errands, shopping? N  Preparing Food and eating ? N  Using the Toilet? N  In the past six months, have you accidently leaked urine? N  Do you have  problems with loss of bowel control? N  Managing your Medications? N  Managing your Finances? N  Housekeeping or managing your Housekeeping? N  Some recent data might be hidden    Patient Care Team: Crecencio Mc, MD as PCP - General (Internal Medicine) Kiser, Kerrie Buffalo, MD (Nephrology) Minna Merritts, MD as Consulting  Physician (Cardiology)    Assessment:   This is a routine wellness examination for Kacy.  I connected with patient 05/12/19 at  9:00 AM EDT by an audio enabled telemedicine application and verified that I am speaking with the correct person using two identifiers. Patient stated full name and DOB. Patient gave permission to continue with virtual visit. Patient's location was at home and Nurse's location was at [place] office.   Glaucoma -suspect; annual visits  Hearing -demonstrates normal hearing during visit. Hemoglobin A1C - 07/2018 (5.8) Cholesterol - 10/2018 Dental- Visits every 6 months. Vision- Cataracts extracted. Wears glasses.   Social  Alcohol intake - yes      Smoking history- never    Smokers in home? none Illicit drug use? none Exercise - walking 5 miles daily Diet - regular  Sexually Active -not currently BMI- discussed the importance of a healthy diet, water intake and the benefits of aerobic exercise.  Educational material provided.   Safety  Patient feels safe at home- yes Patient does have smoke detectors at home- yes Patient does wear sunscreen or protective clothing when in direct sunlight -yes Patient does wear seat belt when in a moving vehicle -yes Patient drives- yes  [id number] precautions and sickness symptoms discussed.   Activities of Daily Living Patient denies needing assistance with: driving, household chores, feeding themselves, getting from bed to chair, getting to the toilet, bathing/showering, dressing, managing money, or preparing meals.  No new identified risk were noted.    Depression Screen Patient denies  losing interest in daily life, feeling hopeless, or crying easily over simple problems.   Medication-taking as directed and without issues.   Memory Screen Patient is alert.  Patient denies difficulty focusing, concentrating or misplacing items. Correctly identified the president of the [place], season and recall 5/5. 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  Immunizations The following Immunizations were discussed: Influenza, shingles, pneumonia, and tetanus.   Other Providers Patient Care Team: Crecencio Mc, MD as PCP - General (Internal Medicine) Kiser, Kerrie Buffalo, MD (Nephrology) Minna Merritts, MD as Consulting Physician (Cardiology)  Exercise Activities and Dietary recommendations Current Exercise Habits: Home exercise routine, Type of exercise: walking, Time (Minutes): > 60, Frequency (Times/Week): 7, Weekly Exercise (Minutes/Week): 0, Intensity: Moderate  Goals    . Follow up with Primary Care Provider     As needed       Fall Risk Fall Risk  05/12/2019 05/10/2018 04/28/2018 12/17/2016 03/19/2016  Falls in the past year? 0 No No Yes Yes  Comment - - Emmi Telephone Survey: data to providers prior to load - -  Number falls in past yr: - - - 1 1  Injury with Fall? - - - No Yes  Follow up - - - Falls prevention discussed -   Is the patient's home free of loose throw rugs in walkways, pet beds, electrical cords, etc? yes      Grab bars in the bathroom? yes      Handrails on the stairs? yes       Adequate lighting? yes  Depression Screen PHQ 2/9 Scores 05/12/2019 05/10/2018 12/17/2016 03/19/2016  PHQ - 2 Score 0 0 0 0     Cognitive Function MMSE - Mini Mental State Exam 05/10/2018 12/17/2016  Orientation to time 5 5  Orientation to Place 5 5  Registration 3 3  Attention/ Calculation 5 5  Recall 3 3  Language- name 2 objects 2 2  Language- repeat 1  1  Language- follow 3 step command 3 3  Language- read & follow direction 1 1  Write a  sentence 1 1  Copy design 1 1  Total score 30 30     6CIT Screen 05/12/2019  What Year? 0 points  What month? 0 points  What time? 0 points  Count back from 20 0 points  Months in reverse 0 points  Repeat phrase 0 points  Total Score 0    Immunization History  Administered Date(s) Administered  . Influenza Nasal 07/09/2017  . Influenza Split 07/13/2012, 07/13/2014  . Influenza, High Dose Seasonal PF 07/21/2018  . Influenza-Unspecified 07/04/2013, 06/30/2015, 07/14/2016  . PPD Test 11/19/2015  . Pneumococcal Conjugate-13 07/28/2013  . Pneumococcal Polysaccharide-23 07/07/2003, 07/06/2014  . Tdap 07/14/2013  . Zoster 09/14/2008  . Zoster Recombinat (Shingrix) 04/10/2018, 06/07/2018    Screening Tests Health Maintenance  Topic Date Due  . INFLUENZA VACCINE  04/30/2019  . MAMMOGRAM  03/15/2020  . TETANUS/TDAP  07/15/2023  . COLONOSCOPY  05/26/2026  . DEXA SCAN  Completed  . Hepatitis C Screening  Completed  . PNA vac Low Risk Adult  Completed       Plan:    End of life planning; Advance aging; Advanced directives discussed.  Copy of current HCPOA/Living Will requested.    I have personally reviewed and noted the following in the patient's chart:   . Medical and social history . Use of alcohol, tobacco or illicit drugs  . Current medications and supplements . Functional ability and status . Nutritional status . Physical activity . Advanced directives . List of other physicians . Hospitalizations, surgeries, and ER visits in previous 12 months . Vitals . Screenings to include cognitive, depression, and falls . Referrals and appointments  In addition, I have reviewed and discussed with patient certain preventive protocols, quality metrics, and best practice recommendations. A written personalized care plan for preventive services as well as general preventive health recommendations were provided to patient.     OBrien-Blaney, Bali Lyn L, LPN  01/06/7352     I  have reviewed the above information and agree with above.   Deborra Medina, MD

## 2019-05-12 NOTE — Progress Notes (Signed)
Telephone Visit  This visit type was conducted due to national recommendations for restrictions regarding the COVID-19 pandemic (e.g. social distancing).  This format is felt to be most appropriate for this patient at this time.  All issues noted in this document were discussed and addressed.  No physical exam was performed (except for noted visual exam findings with Video Visits).   I connected with@ on 05/12/19 at  9:30 AM EDT by  telephone and verified that I am speaking with the correct person using two identifiers. Location patient: home Location provider: work or home office Persons participating in the virtual visit: patient, provider  I discussed the limitations, risks, security and privacy concerns of performing an evaluation and management service by telephone and the availability of in person appointments. I also discussed with the patient that there may be a patient responsible charge related to this service. The patient expressed understanding and agreed to proceed.  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   Prediabetes: last a1c 5.8 stable Feb 2020 . Unintentional 15 lb wt loss since diagnosis resulting from chang in diet.  BMI 15!!! Has resumed sugar and a1c is unchanged .  walking 5 miles daily since COVID Pandemic made 4 people housebound;  Spends about  1 hr 15 min walking daily    Insomnia:  Husband's snores NEEDS CHIN STRAP  FMD:  Renal cysts noted  [facility] urology, reassured that  no workup is needed other than a one year follow up   Osteopenia with paget's disease:   Prolia recommended by endocrine , still awaiting approval  HTN: 122/80 was the highest reading she has had since last visit    ROS: See pertinent positives and negatives per HPI.  Past Medical History:  Diagnosis Date  . Aberrant right subclavian artery   . Cataract    surgery  . Clotting  disorder (HCC)    hx of bleeding after vaginal delivery, dr told her not to have any surgeries that are necessary  . Dysplasia, artery, fibromuscular, renal ([place])   . Fibromuscular dysplasia ([place])   . Fibromuscular dysplasia of renal artery (HCC)    managed by Dr. Gregary Signs Nephrology  . Graves disease   . Heart murmur   . Heart murmur   . Hypertension   . Kidney stone   . Migraines   . Neuropathy with IgA monoclonal gammopathy (HCC)    Dr Mervyn Skeeters  . Paget's disease of the bone   . Pernicious anemia   . Thyroid disease   . Urinary incontinence, mixed    resolved with removal of ained sutures in bladder     Past Surgical History:  Procedure Laterality Date  . BILATERAL SALPINGOOPHORECTOMY    . CATARACT EXTRACTION  Sep 10, 2011, Jan 13,2013  . COLONOSCOPY    . RENAL BIOPSY    . TONSILLECTOMY    . VAGINAL DELIVERY     x2  . VAGINAL HYSTERECTOMY    . WISDOM TOOTH EXTRACTION      Family History  Problem Relation Age of Onset  . Anuerysm Mother   . COPD Mother   . Hypertension Mother   . Heart disease Mother   . Heart failure Mother   . Cancer Father        lung CA  . Heart disease Father   . Heart attack Father   . Heart disease  Brother        viral cardiomyopathy, cocaine use  . Drug abuse Brother   . Cancer Maternal Aunt        esophageal and live rCA,  hisotory of  ETOH and tobacco  . Anuerysm Maternal Grandfather   . Cancer Paternal Grandmother        brain tumor  . Diabetes Sister   . Colon cancer Paternal Uncle     SOCIAL HX:  reports that she has never smoked. She has never used smokeless tobacco. She reports current alcohol use. She reports that she does not use drugs.  Current Outpatient Medications:  .  ASPIRIN 81 PO, , Disp: , Rfl:  .  cholecalciferol (VITAMIN D) 1000 UNITS tablet, Take 1,000 Units by mouth daily., Disp: , Rfl:  .  cyanocobalamin (,VITAMIN B-12,) 1000 MCG/ML injection, AS DIRECTED, Disp: 1 mL, Rfl: 11 .  Cyanocobalamin (VITAMIN B 12  PO), Take by mouth., Disp: , Rfl:  .  SYNTHROID 50 MCG tablet, TAKE ONE TABLET ON AN EMPTY STOMACH WITHA GLASS OF WATER AT LEAST 30 TO 60 MINUTES BEFORE BREAKFAST, Disp: 90 tablet, Rfl: 0 .  Syringe/Needle, Disp, (SYRINGE 3CC/27GX1-1/4") 27G X 1-1/4" 3 ML MISC, For B12 injections, Disp: 100 each, Rfl: 0 .  telmisartan (MICARDIS) 20 MG tablet, TAKE 1/2 TABLET BY MOUTH DAILY, Disp: 45 tablet, Rfl: 0 .  traMADol (ULTRAM) 50 MG tablet, Take 1 tablet (50 mg total) by mouth every 8 (eight) hours as needed., Disp: 30 tablet, Rfl: 3 .  tretinoin (RETIN-A) 0.025 % cream, APPLY TOPICALLY AT BEDTIME AS DIRECTED, Disp: 45 g, Rfl: 1 .  promethazine-dextromethorphan (PROMETHAZINE-DM) 6.25-15 MG/5ML syrup, Take 5 mLs by mouth 4 (four) times daily as needed for cough. (Patient not taking: Reported on 05/12/2019), Disp: 180 mL, Rfl: 1  EXAM:  VITALS per patient if applicable: BP 378/58   Ht 5' 3.5" (1.613 m)   Wt 90 lb 6.4 oz (41 kg)   BMI 15.76 kg/m    General impression: alert, cooperative and articulate.  No signs of being in distress  Lungs: speech is fluent sentence length suggests that patient is not short of breath and not punctuated by cough, sneezing or sniffing. Marland Kitchen   Psych: affect normal.  speech is articulate and non pressured .  Denies suicidal thoughts    ASSESSMENT AND PLAN:   Fibromuscular dysplasia of renal artery Causing secondary hypertension, well controlled currently ,  Continue annual /semi annual follow up with Vascular   Mild protein-calorie malnutrition ([place]) Her weight is stable and she has increased her protein intake as much as her dietary restrictions allow/   Prediabetes  Screening for autommune syndromes was negative by endocrinology  Benign hypertensive kidney disease with chronic kidney disease Well controlled on low dose telmisartan  Renal function stable, no changes today.  Lab Results  Component Value Date   CREATININE 0.99 (H) 12/30/2018   Lab Results   Component Value Date   NA 142 12/30/2018   K 4.8 12/30/2018   CL 106 12/30/2018   CO2 26 12/30/2018     Renal cysts, acquired, bilateral Incidental findings.  Benign appearing per [facility] urology follow up one year.     I discussed the assessment and treatment plan with the patient. The patient was provided an opportunity to ask questions and all were answered. The patient agreed with the plan and demonstrated an understanding of the instructions.   The patient was advised to call back or seek an in-person  evaluation if the symptoms worsen or if the condition fails to improve as anticipated.  I provided 30 minutes of non-face-to-face time during this encounter.   Crecencio Mc, MD

## 2019-05-12 NOTE — Patient Instructions (Addendum)
  Hailey Hunt , Thank you for taking time to come for your Medicare Wellness Visit. I appreciate your ongoing commitment to your health goals. Please review the following plan we discussed and let me know if I can assist you in the future.   These are the goals we discussed: Goals    . Follow up with Primary Care Provider     As needed       This is a list of the screening recommended for you and due dates:  Health Maintenance  Topic Date Due  . Flu Shot  04/30/2019  . Mammogram  03/15/2020  . Tetanus Vaccine  07/15/2023  . Colon Cancer Screening  05/26/2026  . DEXA scan (bone density measurement)  Completed  .  Hepatitis C: One time screening is recommended by Center for Disease Control  (CDC) for  adults born from [birth year] through 1963.   Completed  . Pneumonia vaccines  Completed

## 2019-05-15 ENCOUNTER — Encounter: Payer: Self-pay | Admitting: Internal Medicine

## 2019-05-15 DIAGNOSIS — E441 Mild protein-calorie malnutrition: Secondary | ICD-10-CM | POA: Insufficient documentation

## 2019-05-15 DIAGNOSIS — N281 Cyst of kidney, acquired: Secondary | ICD-10-CM | POA: Insufficient documentation

## 2019-05-15 DIAGNOSIS — E279 Disorder of adrenal gland, unspecified: Secondary | ICD-10-CM | POA: Insufficient documentation

## 2019-05-15 NOTE — Assessment & Plan Note (Signed)
Screening for autommune syndromes was negative by endocrinology

## 2019-05-15 NOTE — Assessment & Plan Note (Addendum)
Causing secondary hypertension, well controlled currently ,  Continue annual /semi annual follow up with Vascular

## 2019-05-15 NOTE — Assessment & Plan Note (Signed)
Incidental findings.  Benign appearing per [facility] urology follow up one year.

## 2019-05-15 NOTE — Assessment & Plan Note (Signed)
Well controlled on low dose telmisartan  Renal function stable, no changes today.  Lab Results  Component Value Date   CREATININE 0.99 (H) 12/30/2018   Lab Results  Component Value Date   NA 142 12/30/2018   K 4.8 12/30/2018   CL 106 12/30/2018   CO2 26 12/30/2018

## 2019-05-15 NOTE — Assessment & Plan Note (Signed)
Her weight is stable and she has increased her protein intake as much as her dietary restrictions allow/  

## 2019-05-23 ENCOUNTER — Other Ambulatory Visit (INDEPENDENT_AMBULATORY_CARE_PROVIDER_SITE_OTHER): Payer: Medicare Other

## 2019-05-23 ENCOUNTER — Other Ambulatory Visit: Payer: Self-pay

## 2019-05-23 DIAGNOSIS — R7303 Prediabetes: Secondary | ICD-10-CM

## 2019-05-23 LAB — COMPREHENSIVE METABOLIC PANEL
ALT: 12 U/L (ref 0–35)
AST: 18 U/L (ref 0–37)
Albumin: 4.3 g/dL (ref 3.5–5.2)
Alkaline Phosphatase: 78 U/L (ref 39–117)
BUN: 28 mg/dL — ABNORMAL HIGH (ref 6–23)
CO2: 26 mEq/L (ref 19–32)
Calcium: 9.3 mg/dL (ref 8.4–10.5)
Chloride: 106 mEq/L (ref 96–112)
Creatinine, Ser: 0.99 mg/dL (ref 0.40–1.20)
GFR: 55.33 mL/min — ABNORMAL LOW (ref >60.00)
Glucose, Bld: 95 mg/dL (ref 70–99)
Potassium: 4 mEq/L (ref 3.5–5.1)
Sodium: 140 mEq/L (ref 135–145)
Total Bilirubin: 0.6 mg/dL (ref 0.2–1.2)
Total Protein: 6.1 g/dL (ref 6.0–8.3)

## 2019-05-23 LAB — HEMOGLOBIN A1C: Hgb A1c MFr Bld: 5.9 % (ref 4.6–6.5)

## 2019-05-24 ENCOUNTER — Telehealth: Payer: Self-pay

## 2019-05-24 NOTE — Telephone Encounter (Signed)
Copied from [place] [id number]. Topic: General - Other >> May 24, 2019 11:29 AM Leward Quan A wrote: Reason for CRM: Patient called to say that she and her two girls  Hailey Hunt and Hailey Hunt will need to be scheduled for the high dose flu shot. Asking for a call back Ph# [phone]

## 2019-05-30 ENCOUNTER — Other Ambulatory Visit: Payer: Self-pay

## 2019-05-30 ENCOUNTER — Ambulatory Visit (INDEPENDENT_AMBULATORY_CARE_PROVIDER_SITE_OTHER): Payer: Medicare Other

## 2019-05-30 DIAGNOSIS — Z23 Encounter for immunization: Secondary | ICD-10-CM | POA: Diagnosis not present

## 2019-06-03 ENCOUNTER — Other Ambulatory Visit: Payer: Self-pay | Admitting: Internal Medicine

## 2019-06-14 ENCOUNTER — Telehealth: Payer: Self-pay

## 2019-06-14 NOTE — Telephone Encounter (Signed)
Dx for osteoporosis is required for Prolia unless there is a history of fracture

## 2019-06-29 ENCOUNTER — Other Ambulatory Visit: Payer: Self-pay

## 2019-06-30 ENCOUNTER — Other Ambulatory Visit: Payer: Self-pay | Admitting: Internal Medicine

## 2019-06-30 MED ORDER — TRETINOIN 0.025 % EX CREA: TOPICAL_CREAM | CUTANEOUS | 1 refills | Status: DC

## 2019-06-30 NOTE — Telephone Encounter (Signed)
tretinoin (RETIN-A) 0.025 % cream   [employer]

## 2019-07-01 ENCOUNTER — Ambulatory Visit (INDEPENDENT_AMBULATORY_CARE_PROVIDER_SITE_OTHER): Payer: Medicare Other | Admitting: Internal Medicine

## 2019-07-01 ENCOUNTER — Encounter: Payer: Self-pay | Admitting: Internal Medicine

## 2019-07-01 ENCOUNTER — Other Ambulatory Visit: Payer: Self-pay

## 2019-07-01 VITALS — BP 100/62 | HR 71 | Ht 63.25 in | Wt 90.8 lb

## 2019-07-01 DIAGNOSIS — M8589 Other specified disorders of bone density and structure, multiple sites: Secondary | ICD-10-CM

## 2019-07-01 DIAGNOSIS — R7303 Prediabetes: Secondary | ICD-10-CM

## 2019-07-01 DIAGNOSIS — M810 Age-related osteoporosis without current pathological fracture: Secondary | ICD-10-CM

## 2019-07-01 DIAGNOSIS — M8889 Osteitis deformans of multiple sites: Secondary | ICD-10-CM

## 2019-07-01 DIAGNOSIS — E890 Postprocedural hypothyroidism: Secondary | ICD-10-CM | POA: Diagnosis not present

## 2019-07-01 LAB — TSH: TSH: 1.68 u[IU]/mL (ref 0.35–4.50)

## 2019-07-01 LAB — T4, FREE: Free T4: 1.06 ng/dL (ref 0.60–1.60)

## 2019-07-01 NOTE — Progress Notes (Addendum)
Patient ID: Hailey Hunt, female   DOB: August 14, 1946, 73 y.o.   MRN: [id number]   HPI  Hailey Hunt is a 73 y.o.-year-old female, returning for follow-up for Paget ds, osteopenia/osteoporosis, postablative hypothyroidism, prediabetes.  Last visit 6 months ago.  She walks 5 mi a day - 1h 15 min.  During this, she talks on the telephone with her sister in [place], who is also walking.  She enjoys this.  She also has an apple watch and completes at the circles every day.  Reviewed history: She had a CTA abd/pelvis on 06/2015:incidental lesion:  Diffuse patchy sclerosis and cortical thickening of the left iliac bone, favored to represent Paget's disease.  She has had an anteverted L femur since her 2s. She has had pain in that his ever since.    Bone scan (01/02/2016): FINDINGS: There is asymmetric increased uptake throughout the left iliac bone. The appearance and distribution is compatible with Paget's disease. Increased uptake in the distribution of the nasal bone is also noted, nonspecific. This could be seen with Paget's disease, fibrous dysplasia or chronic sinusitis. No findings identified to suggest bone metastases. Physiologic uptake is noted within the kidneys and urinary bladder.  IMPRESSION: 1. Increased uptake within the left iliac bone compatible with Paget's disease. 2. Central area of increased uptake in the region of the nasal bone which may also reflect Paget's disease of the skull.  Her HbA1c levels are stable, in the normal range: Lab Results  Component Value Date   ALKPHOS 78 05/23/2019   ALKPHOS 80 11/26/2018   ALKPHOS 71 11/26/2018   ALKPHOS 130 (H) 08/17/2018   ALKPHOS 78 05/10/2018   ALKPHOS 71 12/23/2017   ALKPHOS 82 07/13/2017   ALKPHOS 87 09/18/2016   ALKPHOS 73 11/19/2015   ALKPHOS 96 08/14/2014    Osteopenia/osteoporosis:  Date L1-L4 T score FN T scores FRAX score  12/21/2018 ([place]) -0.5 (-1.6%) RFN: -2.4 LFN: -2.2  (-3%) 10-year MOF:  10.8% 10-year hip fracture risk: 3%  12/16/2016 () -0.40 RFN:-2.2 LFN:-2.1   01/22/2012 ([facility])  -0.60 LFN: -0.66     No recent fractures or falls.  She had a traumatic vertebral fracture as a child.  She has a history of dizziness, but not recently.  She has been on the following osteoporosis treatments: - Actonel for 2 years. This was apparently stopped due to recurrent kidney stones -last episode 2010.  She did not feel good on this (stomach upset, weakness, fatigue). At last visit, I suggested Prolia, but she did not start this yet.  Vitamin D levels have been normal: Lab Results  Component Value Date   VD25OH 55.06 12/30/2018   VD25OH 60.99 02/02/2018   VD25OH 55.42 07/13/2017   VD25OH 49.37 12/12/2016   VD25OH 49.43 11/19/2015   VD25OH 46.46 08/14/2014   VD25OH 62 08/29/2013   VD25OH 21 (L) 01/20/2012   She takes 1000 units vitamin D daily -stable dose for several years.  She walks for exercise.  She had surgical menopause at 73 years old.   Pt does not have a FH of osteoporosis.  No history of hypo-or hypercalcemia and no hyperparathyroidism: Lab Results  Component Value Date   CALCIUM 9.3 05/23/2019   CALCIUM 9.7 12/30/2018   CALCIUM 9.3 11/26/2018   CALCIUM 9.8 08/17/2018   CALCIUM 10.2 05/10/2018   CALCIUM 9.7 02/02/2018   CALCIUM 9.6 12/23/2017   CALCIUM 9.5 12/04/2017   CALCIUM 9.1 10/09/2017   CALCIUM 10.0 07/13/2017   She has a history  of Graves' disease, status post RAI treatment in the 71s.  She developed post ablative hypothyroidism.  Pt is on Synthroid d.a.w. 50 mcg daily, taken: - in am - fasting - at least 30 min from b'fast - no Ca, Fe, MVI, PPIs - not on Biotin On B12 and D vitamins.  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; latest BUN/creatinine: Lab Results   Component Value Date   BUN 28 (H) 05/23/2019   CREATININE 0.99 05/23/2019   Prediabetes: Reviewed HbA1c levels: Lab Results  Component Value Date   HGBA1C 5.9 05/23/2019   HGBA1C 5.8 11/26/2018   HGBA1C 5.8 08/17/2018   HGBA1C 6.2 05/10/2018   HGBA1C 5.3 12/21/2017   HGBA1C 5.9 12/20/2017   HGBA1C 5.3 12/19/2017   HGBA1C 5.3 12/19/2017   HGBA1C 5.3 12/18/2017   HGBA1C 5.3 12/18/2017   Investigation for type 1 diabetes was negative: Component     Latest Ref Rng & Units 06/30/2018  C-Peptide     0.80 - 3.85 ng/mL 2.45  Glucose, Plasma     65 - 99 mg/dL 93  ZNT8 Antibodies      negative  Glutamic Acid Decarb Ab     <5 IU/mL <5  Islet Cell Ab     Neg:<1:1 Negative    ROS: Constitutional: no weight gain/no weight loss, no fatigue, no subjective hyperthermia, no subjective hypothermia Eyes: no blurry vision, no xerophthalmia ENT: no sore throat, no nodules palpated in neck, no dysphagia, no odynophagia, no hoarseness Cardiovascular: no CP/no SOB/no palpitations/no leg swelling Respiratory: no cough/no SOB/no wheezing Gastrointestinal: no N/no V/no D/no C/no acid reflux Musculoskeletal: no muscle aches/no joint aches Skin: no rashes, no hair loss Neurological: no tremors/no numbness/no tingling/no dizziness  I reviewed pt's medications, allergies, PMH, social hx, family hx, and changes were documented in the history of present illness. Otherwise, unchanged from my initial visit note.  Past Medical History:  Diagnosis Date  . Aberrant right subclavian artery   . Cataract    surgery  . Clotting disorder (HCC)    hx of bleeding after vaginal delivery, dr told her not to have any surgeries that are necessary  . Dysplasia, artery, fibromuscular, renal ([place])   . Fibromuscular dysplasia ([place])   . Fibromuscular dysplasia of renal artery (HCC)    managed by Dr. Gregary [facility]  . Graves disease   . Heart murmur   . Heart murmur   . Hypertension   . Kidney stone    . Migraines   . Neuropathy with IgA monoclonal gammopathy (HCC)    Dr Mervyn Skeeters  . Paget's disease of the bone   . Pernicious anemia   . Thyroid disease   . Urinary incontinence, mixed    resolved with removal of ained sutures in bladder    Past Surgical History:  Procedure Laterality Date  . BILATERAL SALPINGOOPHORECTOMY    . CATARACT EXTRACTION  Sep 10, 2011, Jan 13,2013  . COLONOSCOPY    . RENAL BIOPSY    . TONSILLECTOMY    . VAGINAL DELIVERY     x2  . VAGINAL HYSTERECTOMY    . WISDOM TOOTH EXTRACTION     Social History   Socioeconomic History  . Marital status: Married    Spouse name: Not on file  . Number  of children: 2  . Years of education: Not on file  . Highest education level: Not on file  Occupational History  . Occupation: Network engineer for [employer]  . Financial resource strain: Not hard at all  . Food insecurity    Worry: Never true    Inability: Never true  . Transportation needs    Medical: No    Non-medical: No  Tobacco Use  . Smoking status: Never Smoker  . Smokeless tobacco: Never Used  Substance and Sexual Activity  . Alcohol use: Yes    Comment: once a year  . Drug use: No  . Sexual activity: Not Currently  Lifestyle  . Physical activity    Days per week: 7 days    Minutes per session: Not on file  . Stress: Not at all  Relationships  . Social Herbalist on phone: Not on file    Gets together: Not on file    Attends religious service: Not on file    Active member of club or organization: Not on file    Attends meetings of clubs or organizations: Not on file    Relationship status: Married  . Intimate partner violence    Fear of current or ex partner: No    Emotionally abused: No    Physically abused: No    Forced sexual activity: No  Other Topics Concern  . Not on file  Social History Narrative  . Not on file   Current Outpatient Medications on File Prior to Visit  Medication Sig Dispense Refill  . ASPIRIN 81  PO     . cholecalciferol (VITAMIN D) 1000 UNITS tablet Take 1,000 Units by mouth daily.    . cyanocobalamin (,VITAMIN B-12,) 1000 MCG/ML injection AS DIRECTED 1 mL 11  . Cyanocobalamin (VITAMIN B 12 PO) Take by mouth.    . promethazine-dextromethorphan (PROMETHAZINE-DM) 6.25-15 MG/5ML syrup Take 5 mLs by mouth 4 (four) times daily as needed for cough. (Patient not taking: Reported on 05/12/2019) 180 mL 1  . SYNTHROID 50 MCG tablet TAKE ONE TABLET ON AN EMPTY STOMACH WITHA GLASS OF WATER AT LEAST 30 TO 60 MINUTES BEFORE BREAKFAST 90 tablet 0  . Syringe/Needle, Disp, (SYRINGE 3CC/27GX1-1/4") 27G X 1-1/4" 3 ML MISC For B12 injections 100 each 0  . telmisartan (MICARDIS) 20 MG tablet TAKE 1/2 TABLET BY MOUTH DAILY 45 tablet 0  . traMADol (ULTRAM) 50 MG tablet Take 1 tablet (50 mg total) by mouth every 8 (eight) hours as needed. 30 tablet 3  . tretinoin (RETIN-A) 0.025 % cream APPLY TOPICALLY AT BEDTIME AS DIRECTED 45 g 1   Current Facility-Administered Medications on File Prior to Visit  Medication Dose Route Frequency Provider Last Rate Last Dose  . 0.9 %  sodium chloride infusion  500 mL Intravenous Continuous Pyrtle, Lajuan Lines, MD       Allergies  Allergen Reactions  . Voltaren [Diclofenac] Hypertension  . Adhesive [Tape]   . Demerol     HYPOTENSION  . Macrodantin   . Nsaids Hypertension  . Voltaren [Diclofenac Sodium] Other (See Comments)    FMD, increased bp  . Codeine Nausea And Vomiting and Rash  . Nickel Rash  . Penicillins Rash  . Sulfa Antibiotics Rash    Fever   Family History  Problem Relation Age of Onset  . Anuerysm Mother   . COPD Mother   . Hypertension Mother   . Heart disease Mother   . Heart failure Mother   .  Cancer Father        lung CA  . Heart disease Father   . Heart attack Father   . Heart disease Brother        viral cardiomyopathy, cocaine use  . Drug abuse Brother   . Cancer Maternal Aunt        esophageal and live rCA,  hisotory of  ETOH and tobacco   . Anuerysm Maternal Grandfather   . Cancer Paternal Grandmother        brain tumor  . Diabetes Sister   . Colon cancer Paternal Uncle    PE: BP 100/62   Pulse 71   Ht 5' 3.25" (1.607 m) Comment: measured today without shoes  Wt 90 lb 12.8 oz (41.2 kg)   SpO2 95%   BMI 15.96 kg/m  Body mass index is 15.96 kg/m. Wt Readings from Last 3 Encounters:  07/01/19 90 lb 12.8 oz (41.2 kg)  05/12/19 90 lb 6.4 oz (41 kg)  12/30/18 90 lb 6 oz (41 kg)   Constitutional: thin, in NAD Eyes: PERRLA, EOMI, no exophthalmos ENT: moist mucous membranes, no thyromegaly, no cervical lymphadenopathy Cardiovascular: RRR, No MRG Respiratory: CTA B Gastrointestinal: abdomen soft, NT, ND, BS+ Musculoskeletal: no deformities, strength intact in all 4 Skin: moist, warm, no rashes Neurological: no tremor with outstretched hands, DTR normal in all 4  Assessment: 1. Paget disease  2. Osteopenia  3. Postablative hypothyroidism - RAI tx for Graves ds. In 1980s  4.  Prediabetes  Plan: 1. Paget ds. -The lesion in the left hip was observed incidentally on pelvic CTA at [facility].  This was then again noticed on the bone scan from 2017, along with a nasal focus. -No pain or warmth at the site of her pagetoid wounds -We discussed the previous visit that Paget's disease lesions do not need to be treated unless they are painful, contiguous with a joint, or in weightbearing joints. Severe complications like bone cancer, fractures, heart failure, are extremely rare nowadays. -We reviewed together her latest alkaline phosphatase  levels, last normal 1.5 months ago -She would like to avoid bisphosphonates due to previous intolerance -We will continue to monitor her clinically and by alkaline phosphatase.  We plan to start Prolia for osteopenia and this will also benefit her Paget's disease.  She will need another bone scan if this trends up.  2. Osteopenia -Reviewed latest bone density scan showing slightly worse  bone density scores at the femoral necks (however, this did not reach statistical significance).  Her FRAX score revealed a higher risk for fracture so we discussed about anti-osteoporotic treatment at last visit.  We discussed about different medication classes, mechanism of action, and possible side effects.  She agreed to start Prolia.  However, she did not start this yet as she was not called to schedule it.  We will try to go ahead with it now.  Again discussed about benefits and possible side effects. -Latest vitamin D level was normal in 12/2018 - 1000 units vitamin D daily.  We will continue this.  3. Postablative hypothyroidism - latest thyroid labs reviewed with pt >> normal 10/2018 - she continues on Synthroid d.a.w. 50 mcg daily - pt feels good on this dose. - we discussed about taking the thyroid hormone every day, with water, >30 minutes before breakfast, separated by >4 hours from acid reflux medications, calcium, iron, multivitamins. Pt. is taking it correctly. - We will repeat TFTs now  4.  Prediabetes -Her HbA1c fluctuates between  the normal range and the prediabetic range. -She did adjust her diet by cutting out fast foods, concentrated sweets and starches.  She lost weight afterwards and this prompted the need investigation for type 1 diabetes.  This was negative. -Latest HbA1c was only slightly elevated, at 5.9% in 04/2019 -No intervention is needed for now -continue exercise  Office Visit on 07/01/2019  Component Date Value Ref Range Status  . Glucose, Bld 07/01/2019 93  65 - 99 mg/dL Final   Comment: .            Fasting reference interval .   . BUN 07/01/2019 17  7 - 25 mg/dL Final  . Creat 07/01/2019 1.05* 0.60 - 0.93 mg/dL Final   Comment: For patients >38 years of age, the reference limit for Creatinine is approximately 13% higher for people identified as African-American. .   . GFR, Est Non African American 07/01/2019 54* > OR = 60 mL/min/1.75m2 Final  .  GFR, Est African American 07/01/2019 62  > OR = 60 mL/min/1.55m2 Final  . BUN/Creatinine Ratio 07/01/2019 16  6 - 22 (calc) Final  . Sodium 07/01/2019 143  135 - 146 mmol/L Final  . Potassium 07/01/2019 4.9  3.5 - 5.3 mmol/L Final  . Chloride 07/01/2019 108  98 - 110 mmol/L Final  . CO2 07/01/2019 27  20 - 32 mmol/L Final  . Calcium 07/01/2019 9.8  8.6 - 10.4 mg/dL Final  . TSH 07/01/2019 1.68  0.35 - 4.50 uIU/mL Final  . Free T4 07/01/2019 1.06  0.60 - 1.60 ng/dL Final   Comment: Specimens from patients who are undergoing biotin therapy and /or ingesting biotin supplements may contain high levels of biotin.  The higher biotin concentration in these specimens interferes with this Free T4 assay.  Specimens that contain high levels  of biotin may cause false high results for this Free T4 assay.  Please interpret results in light of the total clinical presentation of the patient.     Normal TFTs.  Slightly lower kidney function, but similar to before.  We will try to start Prolia. Insurance will not cover her Prolia unless the patient has a diagnosis of osteoporosis.  Guidelines recommend treatment for patients with T-scores in the osteopenic range and an elevated FRAX score.  Therefore, we will change her diagnosis to osteoporosis.  Philemon Kingdom, MD PhD [facility]

## 2019-07-01 NOTE — Patient Instructions (Addendum)
Please stop at the lab.  Please continue Synthroid 50 mcg daily.  Take the thyroid hormone every day, with water, at least 30 minutes before breakfast, separated by at least 4 hours from: - acid reflux medications - calcium - iron - multivitamins  Please come back for a follow-up appointment in 1 year. 

## 2019-07-02 LAB — BASIC METABOLIC PANEL WITH GFR
BUN/Creatinine Ratio: 16 (calc) (ref 6–22)
BUN: 17 mg/dL (ref 7–25)
CO2: 27 mmol/L (ref 20–32)
Calcium: 9.8 mg/dL (ref 8.6–10.4)
Chloride: 108 mmol/L (ref 98–110)
Creat: 1.05 mg/dL — ABNORMAL HIGH (ref 0.60–0.93)
GFR, Est African American: 62 mL/min/{1.73_m2} (ref >=60)
GFR, Est Non African American: 54 mL/min/{1.73_m2} — ABNORMAL LOW (ref >=60)
Glucose, Bld: 93 mg/dL (ref 65–99)
Potassium: 4.9 mmol/L (ref 3.5–5.3)
Sodium: 143 mmol/L (ref 135–146)

## 2019-07-07 NOTE — Telephone Encounter (Signed)
Spoke with the Prolia rep today and she stated this patient will not be approved due to Medicare only accepting the Dx code of M81.0 or M80.0- I did submit her through the portal for a second time using the new information given by Dr. Cruzita Lederer and will update as soon as possible

## 2019-07-12 DIAGNOSIS — I773 Arterial fibromuscular dysplasia: Secondary | ICD-10-CM | POA: Diagnosis not present

## 2019-07-12 DIAGNOSIS — I701 Atherosclerosis of renal artery: Secondary | ICD-10-CM | POA: Diagnosis not present

## 2019-07-12 DIAGNOSIS — R1013 Epigastric pain: Secondary | ICD-10-CM | POA: Diagnosis not present

## 2019-08-10 ENCOUNTER — Other Ambulatory Visit: Payer: Self-pay

## 2019-08-10 ENCOUNTER — Ambulatory Visit (INDEPENDENT_AMBULATORY_CARE_PROVIDER_SITE_OTHER): Payer: Medicare Other

## 2019-08-10 DIAGNOSIS — M810 Age-related osteoporosis without current pathological fracture: Secondary | ICD-10-CM | POA: Diagnosis not present

## 2019-08-10 DIAGNOSIS — M8589 Other specified disorders of bone density and structure, multiple sites: Secondary | ICD-10-CM | POA: Diagnosis not present

## 2019-08-10 MED ORDER — DENOSUMAB 60 MG/ML ~~LOC~~ SOSY
60.0000 mg | PREFILLED_SYRINGE | Freq: Once | SUBCUTANEOUS | Status: AC
  Administered 2019-08-10: 14:00:00 60 mg via SUBCUTANEOUS

## 2019-08-10 NOTE — Progress Notes (Signed)
Per orders of Dr. Gherghe injection of Prolia given today by Tashera Montalvo, Certified Medical Assistant . Patient tolerated injection well.  

## 2019-08-29 DIAGNOSIS — I779 Disorder of arteries and arterioles, unspecified: Secondary | ICD-10-CM | POA: Diagnosis not present

## 2019-08-29 DIAGNOSIS — R1013 Epigastric pain: Secondary | ICD-10-CM | POA: Diagnosis not present

## 2019-08-29 DIAGNOSIS — I773 Arterial fibromuscular dysplasia: Secondary | ICD-10-CM | POA: Diagnosis not present

## 2019-09-01 IMAGING — US US ABDOMEN COMPLETE
1 series · 13 of 25 positions shown · non-contrast
Comparison: None.

CLINICAL DATA: Postprandial epigastric pain for 1 month.

EXAM:
ABDOMEN ULTRASOUND COMPLETE

[Series 1: us abdomen complete · 0.12mm/px · 13 of 119 slices shown]
[im 1/119]
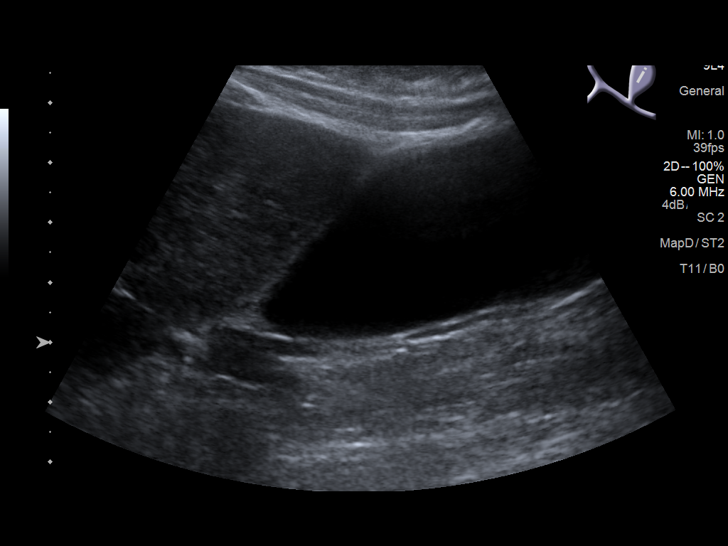
[im 10/119]
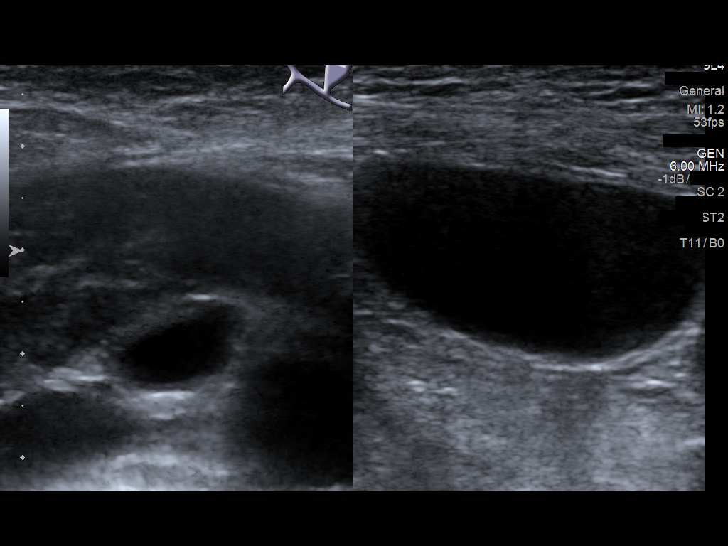
[im 20/119]
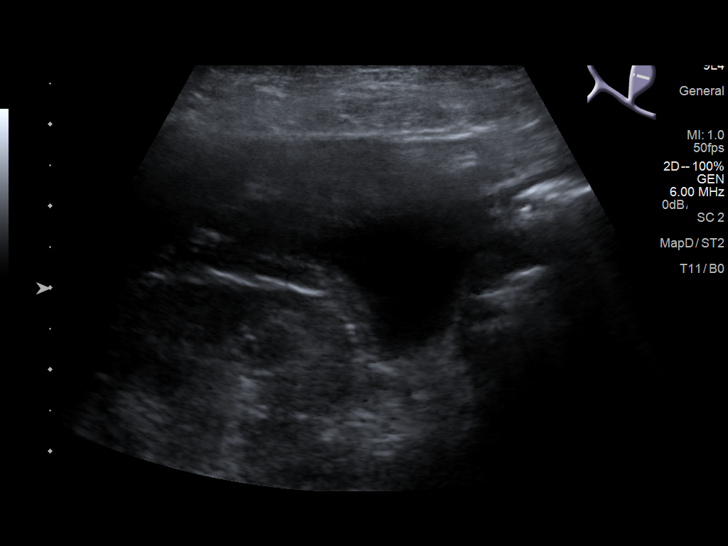
[im 30/119]
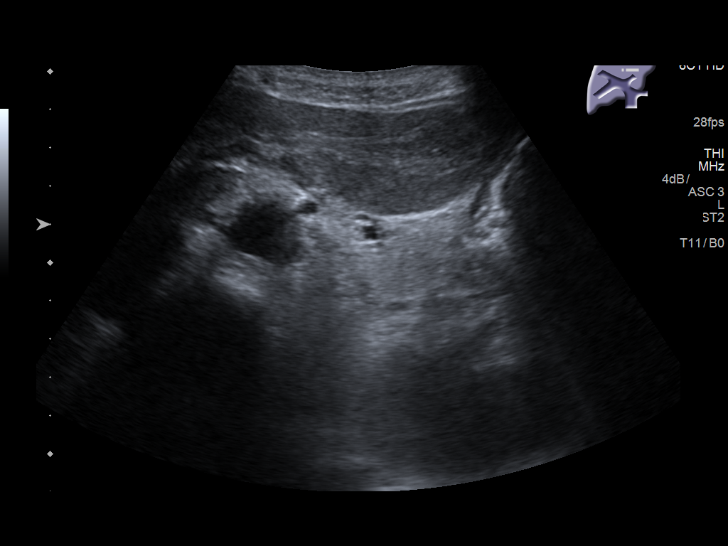
[im 40/119]
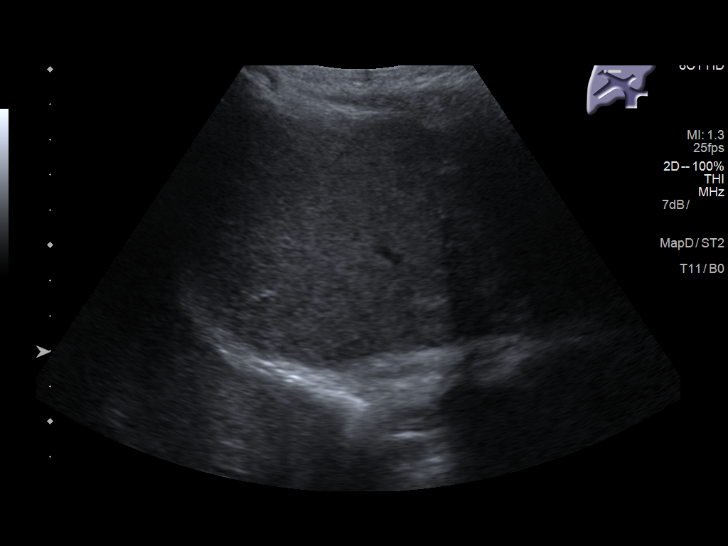
[im 50/119]
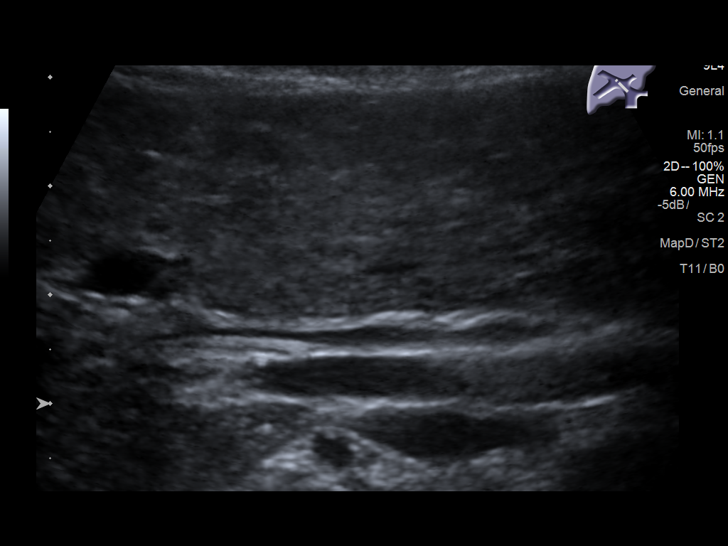
[im 60/119]
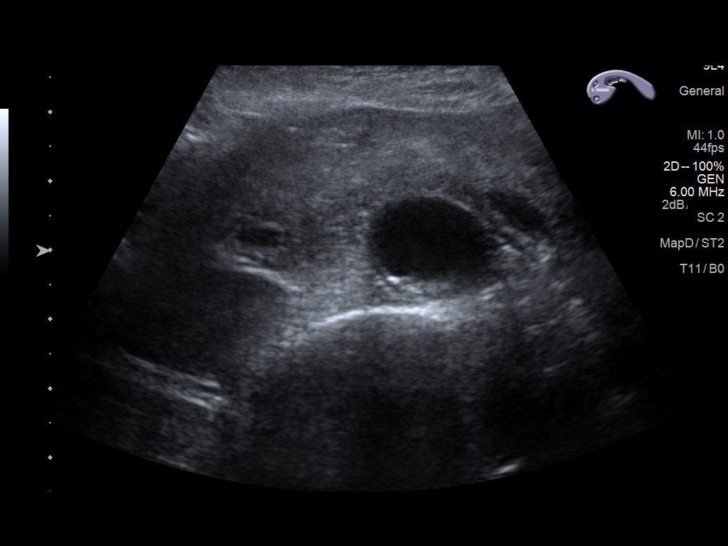
[im 69/119]
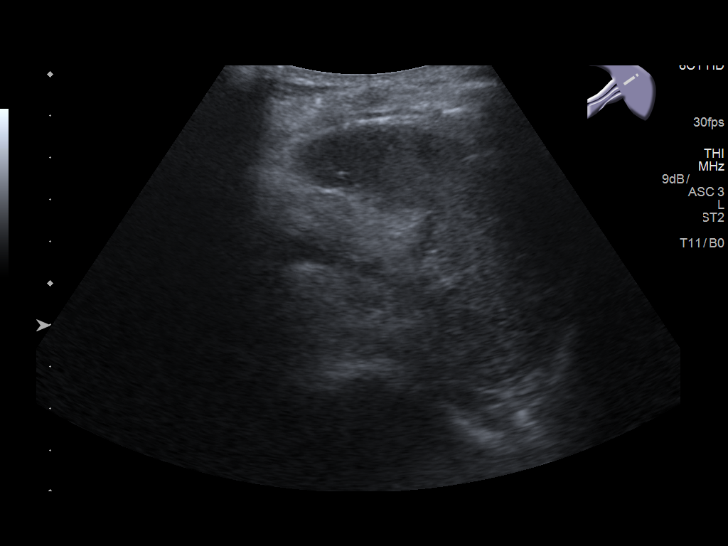
[im 79/119]
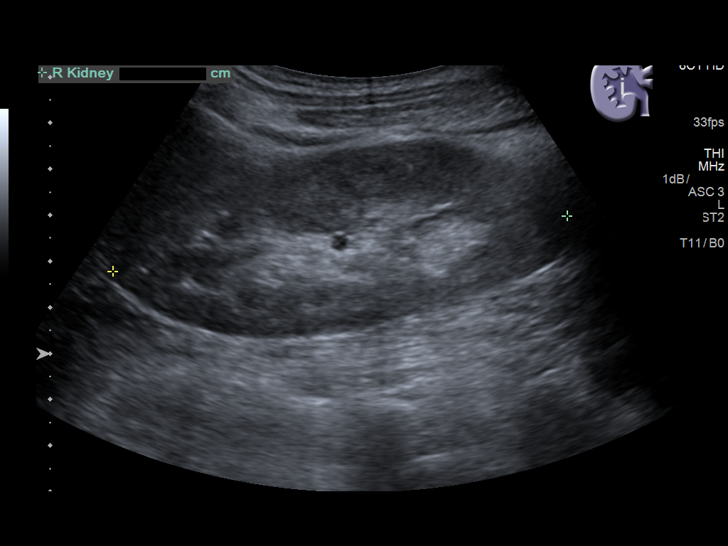
[im 89/119]
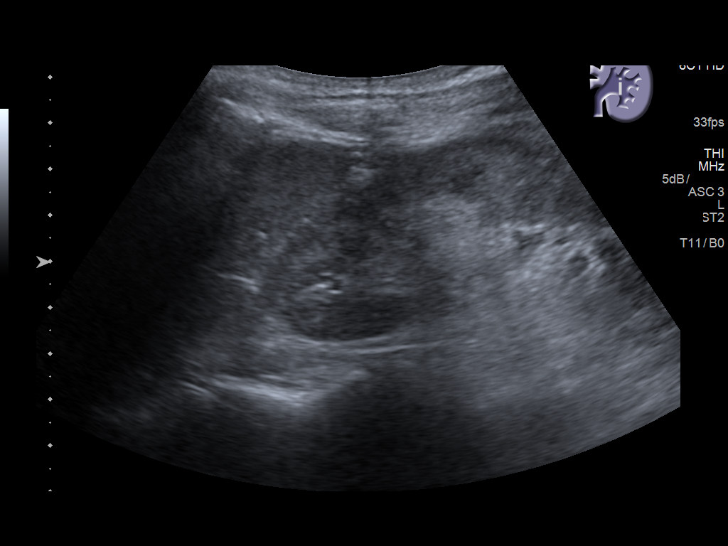
[im 99/119]
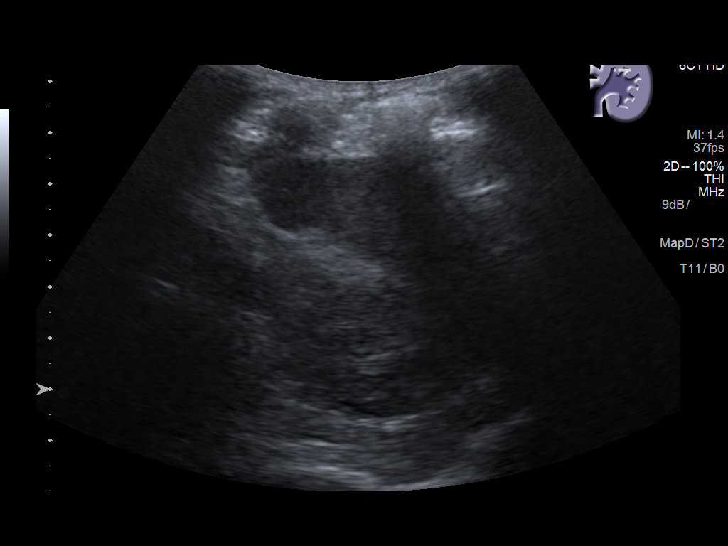
[im 109/119]
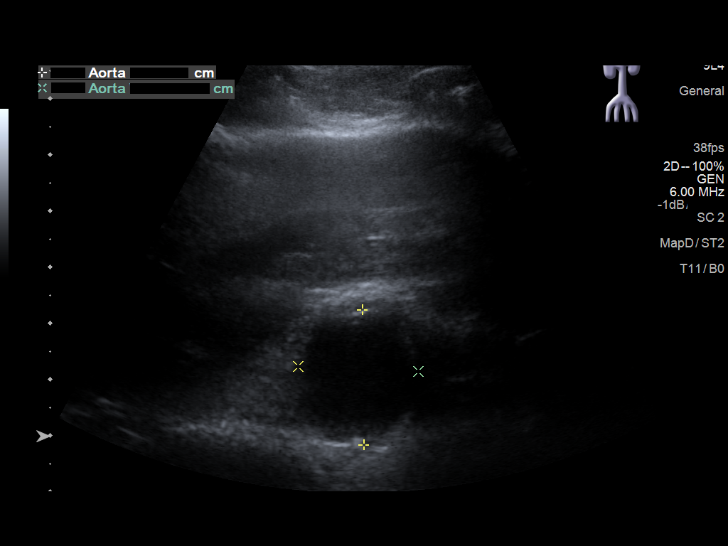
[im 119/119]
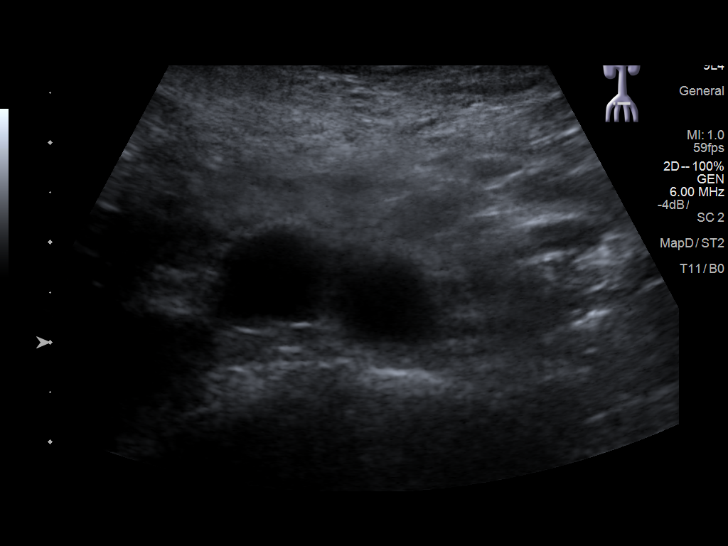

[13 of 25 positions shown; findings below may reference images not displayed]

FINDINGS: Gallbladder: No gallstones or wall thickening visualized. No
sonographic Murphy sign noted by sonographer.

Common bile duct: Diameter: 2.2 mm

Liver: No focal lesion identified. Within normal limits in
parenchymal echogenicity. Portal vein is patent on color Doppler
imaging with normal direction of blood flow towards the liver.

IVC: No abnormality visualized.

Pancreas: Visualized portion unremarkable.

Spleen: Size and appearance within normal limits.

Right Kidney: Length: 9.8 cm. Echogenicity within normal limits. No
hydronephrosis visualized. There is a 1.5 x 1.7 x 0.6 cm complex
cystic mass in the midpole region of the right kidney.

Left Kidney: Length: 2.0 cm. Echogenicity within normal limits. No
hydronephrosis visualized. There is a 1.8 x 1.5 x 1.6 cm complex
cystic mass in the lower pole of the left kidney.

Abdominal aorta: No aneurysm visualized. Atherosclerotic
calcifications noted.

Other findings: None.
IMPRESSION: Bilateral renal complex cystic masses. These may represent
complicated cysts, but short-term, six-month follow-up is
recommended to assure stability.

Otherwise, no sonographic abnormalities within the abdomen.

## 2019-09-06 ENCOUNTER — Other Ambulatory Visit: Payer: Self-pay | Admitting: Internal Medicine

## 2019-09-06 MED ORDER — TELMISARTAN 20 MG PO TABS: 10.0000 mg | ORAL_TABLET | Freq: Every day | ORAL | 1 refills | Status: DC

## 2019-09-06 NOTE — Telephone Encounter (Signed)
Medication Refill - Medication: telmisartan (MICARDIS) 20 MG tablet   Has the patient contacted their pharmacy? No. (Agent: If no, request that the patient contact the pharmacy for the refill.) (Agent: If yes, when and what did the pharmacy advise?)  Preferred Pharmacy (with phone number or street name):  [place], [place] [phone] (Phone) [phone] (Fax     Agent: Please be advised that RX refills may take up to 3 business days. We ask that you follow-up with your pharmacy.

## 2019-09-06 NOTE — Telephone Encounter (Signed)
Refill sent as directed

## 2019-10-02 IMAGING — DX DG CHEST 2V
2 series · 2 of 2 positions shown · non-contrast
Comparison: 10/09/2017

CLINICAL DATA: Nonproductive cough.

EXAM:
CHEST - 2 VIEW

[chest pa]
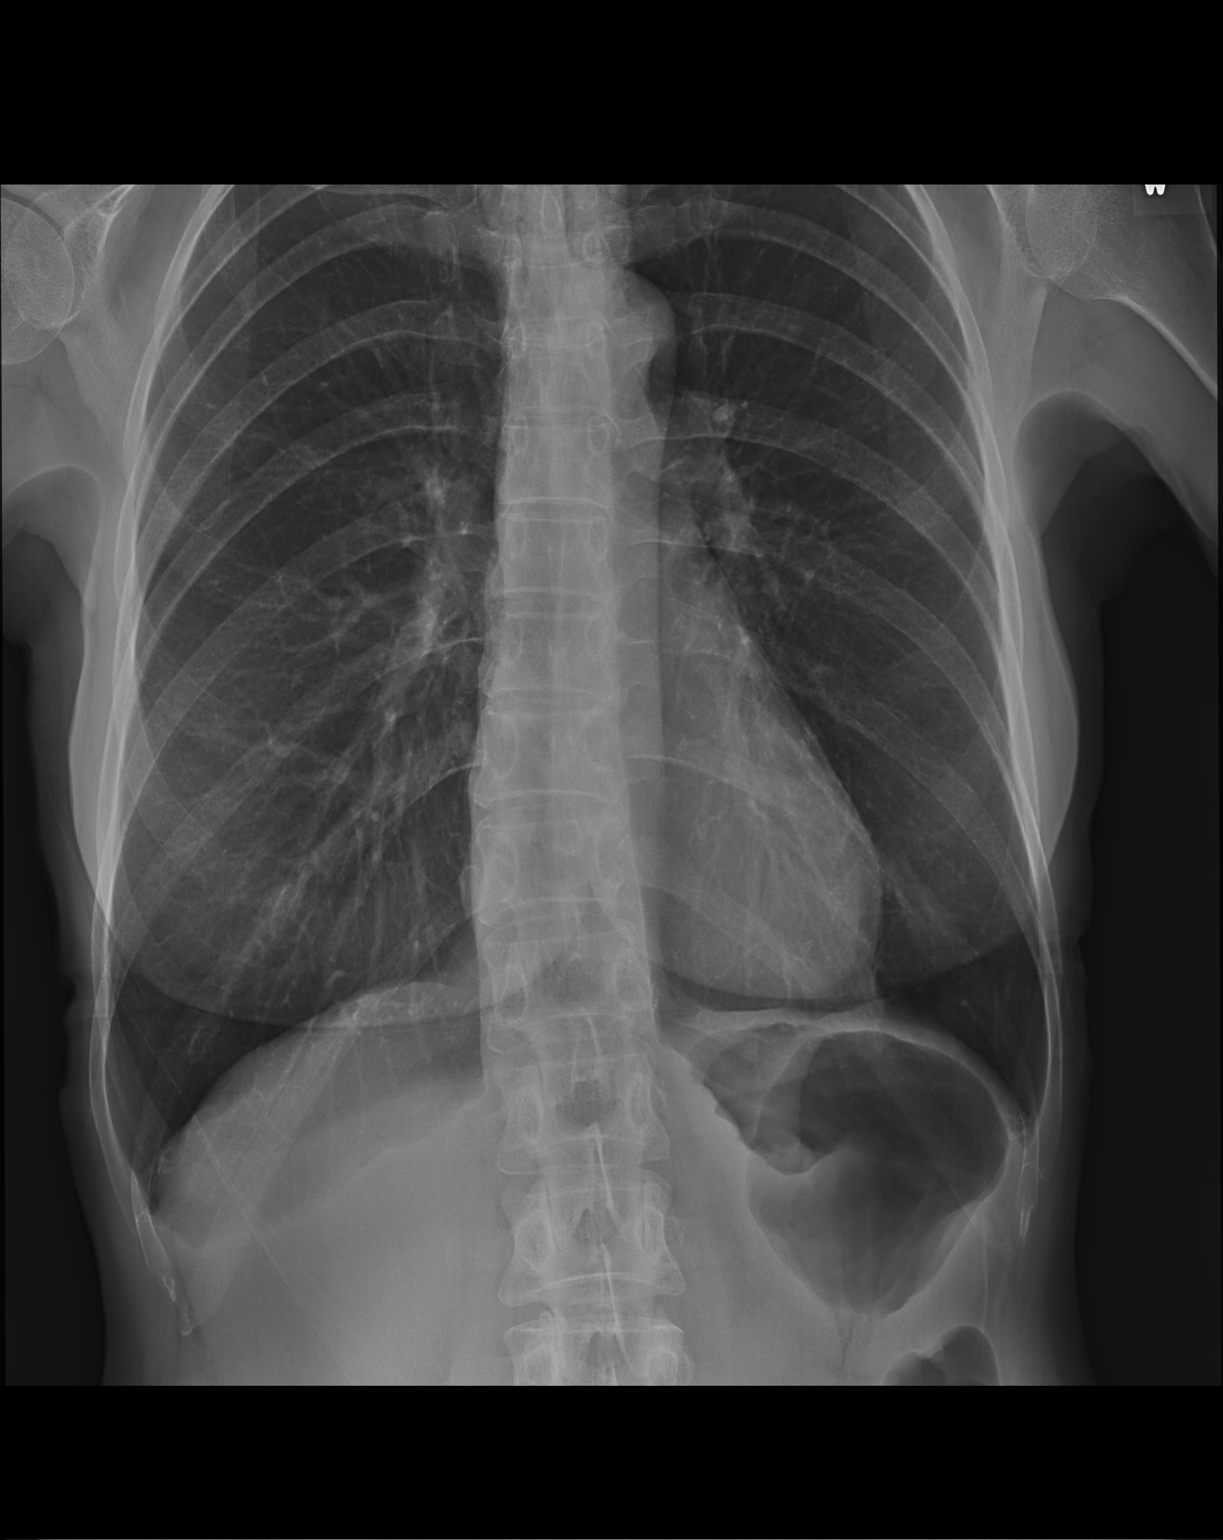

[chest lat]
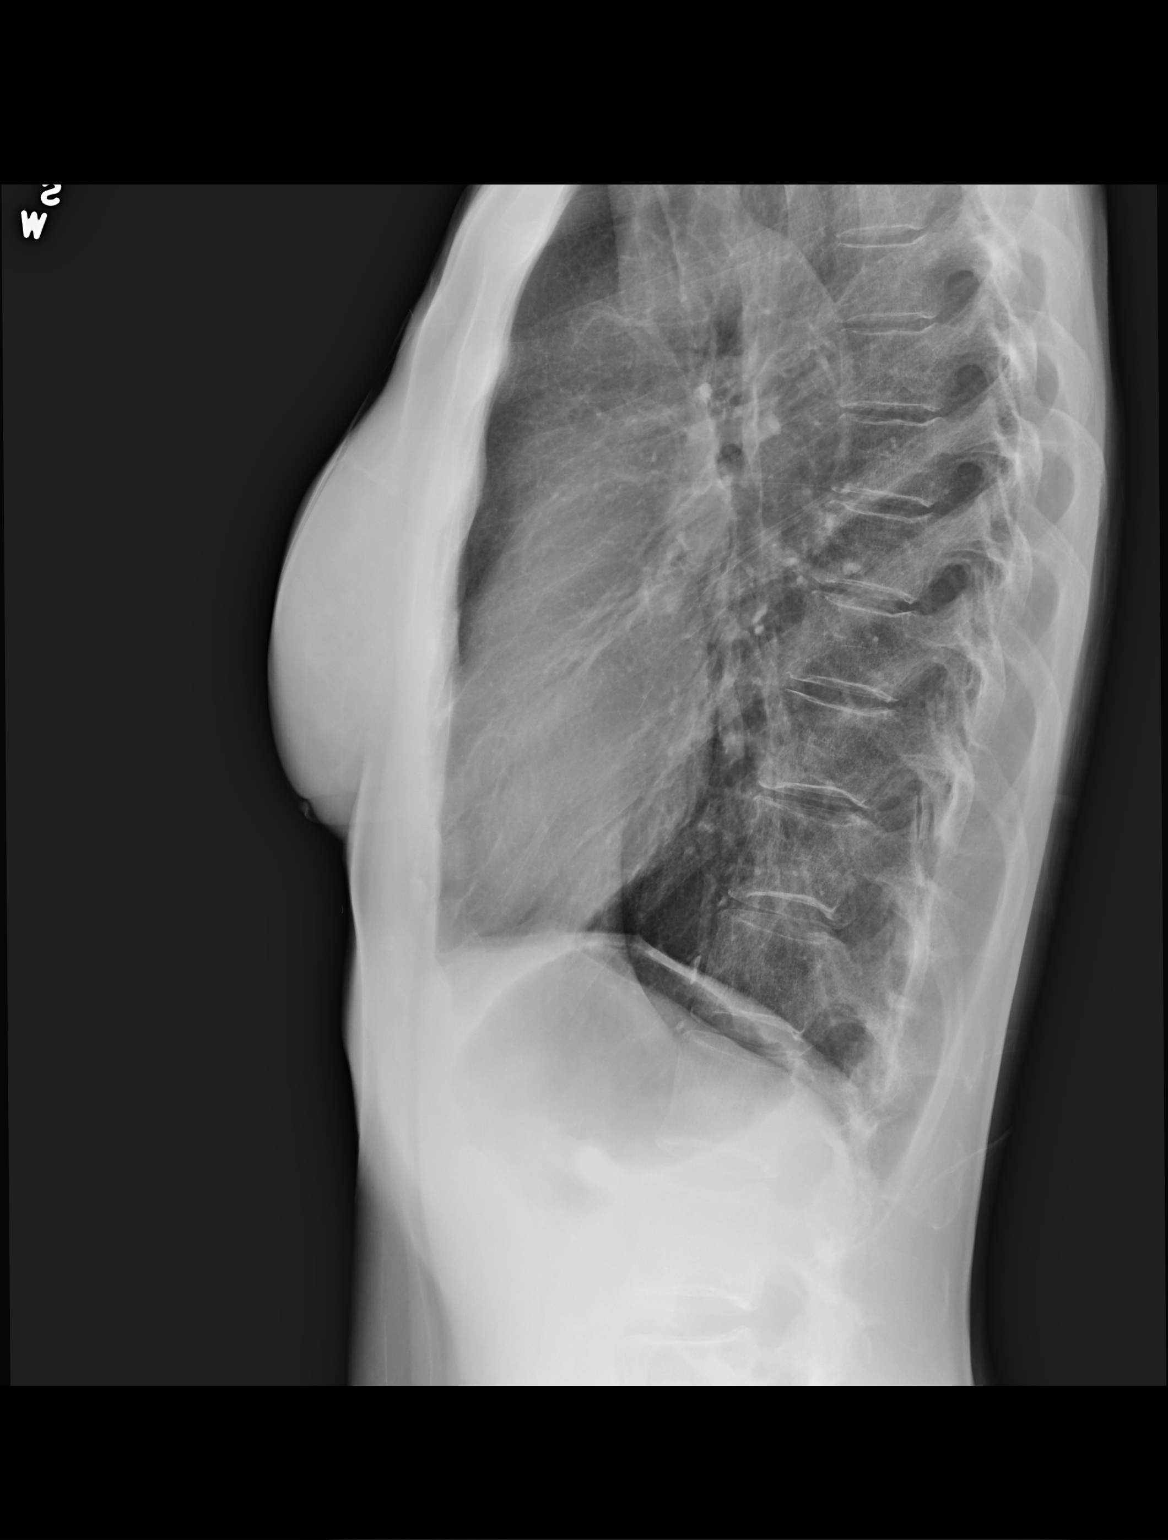

[2 of 2 positions shown; findings below may reference images not displayed]

FINDINGS: The heart size and mediastinal contours are within normal limits.
Both lungs are clear. The visualized skeletal structures are
unremarkable.
IMPRESSION: No active cardiopulmonary disease.

## 2019-10-03 ENCOUNTER — Telehealth: Payer: Self-pay | Admitting: Internal Medicine

## 2019-10-03 MED ORDER — SYNTHROID 50 MCG PO TABS: ORAL_TABLET | ORAL | 0 refills | Status: DC

## 2019-10-03 NOTE — Telephone Encounter (Signed)
Medication has been refilled and sent to [facility].

## 2019-10-03 NOTE — Telephone Encounter (Signed)
Pt needs a refill sent to [facility] of SYNTHROID 50 MCG tablet.

## 2019-10-06 DIAGNOSIS — N952 Postmenopausal atrophic vaginitis: Secondary | ICD-10-CM | POA: Diagnosis not present

## 2019-10-06 DIAGNOSIS — N390 Urinary tract infection, site not specified: Secondary | ICD-10-CM | POA: Diagnosis not present

## 2019-10-06 DIAGNOSIS — N393 Stress incontinence (female) (male): Secondary | ICD-10-CM | POA: Diagnosis not present

## 2019-10-06 DIAGNOSIS — Z681 Body mass index (BMI) 19 or less, adult: Secondary | ICD-10-CM | POA: Diagnosis not present

## 2019-11-12 DIAGNOSIS — Z23 Encounter for immunization: Secondary | ICD-10-CM | POA: Diagnosis not present

## 2019-12-10 DIAGNOSIS — Z23 Encounter for immunization: Secondary | ICD-10-CM | POA: Diagnosis not present

## 2019-12-21 ENCOUNTER — Other Ambulatory Visit: Payer: Self-pay | Admitting: Internal Medicine

## 2020-01-20 ENCOUNTER — Telehealth: Payer: Self-pay | Admitting: Internal Medicine

## 2020-01-20 NOTE — Telephone Encounter (Signed)
Patient submitted to prolia portal Awaiting SOB

## 2020-01-31 NOTE — Telephone Encounter (Signed)
Patient called this morning checking on status of prolia - told her you submitted to the portal on the 23 and may still be waiting for response from insurance. She requests a call back with a status update. [phone]

## 2020-02-01 NOTE — Telephone Encounter (Signed)
Pt has been contacted and is already scheduled for prolia injection.

## 2020-02-08 ENCOUNTER — Ambulatory Visit (INDEPENDENT_AMBULATORY_CARE_PROVIDER_SITE_OTHER): Payer: Medicare Other

## 2020-02-08 ENCOUNTER — Other Ambulatory Visit: Payer: Self-pay

## 2020-02-08 DIAGNOSIS — M810 Age-related osteoporosis without current pathological fracture: Secondary | ICD-10-CM

## 2020-02-08 MED ORDER — DENOSUMAB 60 MG/ML ~~LOC~~ SOSY
60.0000 mg | PREFILLED_SYRINGE | Freq: Once | SUBCUTANEOUS | Status: AC
  Administered 2020-02-08: 60 mg via SUBCUTANEOUS

## 2020-02-08 NOTE — Progress Notes (Signed)
Per orders of Dr. Gherghe injection of Prolia given today by Crysta Gulick W . Patient tolerated injection well. 

## 2020-03-02 DIAGNOSIS — I10 Essential (primary) hypertension: Secondary | ICD-10-CM | POA: Diagnosis not present

## 2020-03-02 DIAGNOSIS — R3129 Other microscopic hematuria: Secondary | ICD-10-CM | POA: Diagnosis not present

## 2020-03-02 DIAGNOSIS — I129 Hypertensive chronic kidney disease with stage 1 through stage 4 chronic kidney disease, or unspecified chronic kidney disease: Secondary | ICD-10-CM | POA: Diagnosis not present

## 2020-03-08 DIAGNOSIS — I773 Arterial fibromuscular dysplasia: Secondary | ICD-10-CM | POA: Diagnosis not present

## 2020-03-08 DIAGNOSIS — I10 Essential (primary) hypertension: Secondary | ICD-10-CM | POA: Diagnosis not present

## 2020-03-08 DIAGNOSIS — R3129 Other microscopic hematuria: Secondary | ICD-10-CM | POA: Diagnosis not present

## 2020-03-08 DIAGNOSIS — Z681 Body mass index (BMI) 19 or less, adult: Secondary | ICD-10-CM | POA: Diagnosis not present

## 2020-03-08 DIAGNOSIS — N390 Urinary tract infection, site not specified: Secondary | ICD-10-CM | POA: Diagnosis not present

## 2020-03-16 LAB — HM MAMMOGRAPHY

## 2020-03-30 ENCOUNTER — Encounter: Payer: Self-pay | Admitting: Internal Medicine

## 2020-04-08 ENCOUNTER — Other Ambulatory Visit: Payer: Self-pay | Admitting: Internal Medicine

## 2020-04-16 DIAGNOSIS — H268 Other specified cataract: Secondary | ICD-10-CM | POA: Diagnosis not present

## 2020-04-16 DIAGNOSIS — H40013 Open angle with borderline findings, low risk, bilateral: Secondary | ICD-10-CM | POA: Diagnosis not present

## 2020-05-14 ENCOUNTER — Ambulatory Visit (INDEPENDENT_AMBULATORY_CARE_PROVIDER_SITE_OTHER): Payer: Medicare Other

## 2020-05-14 ENCOUNTER — Telehealth: Payer: Self-pay

## 2020-05-14 VITALS — Ht 63.25 in | Wt 90.0 lb

## 2020-05-14 DIAGNOSIS — Z0000 Encounter for general adult medical examination without abnormal findings: Secondary | ICD-10-CM

## 2020-05-14 NOTE — Telephone Encounter (Signed)
Failed attempt to reach patient for scheduled awv. No answer. Left message to call the office back in allotted timeframe to complete visit or reschedule.

## 2020-05-14 NOTE — Progress Notes (Addendum)
Subjective:   Hailey Hunt is a 74 y.o. female who presents for Medicare Annual (Subsequent) preventive examination.  Review of Systems    No ROS.  Medicare Wellness Virtual Visit.   Cardiac Risk Factors include: advanced age (>79men, >3 women);hypertension     Objective:    Today's Vitals   05/14/20 0907  Weight: 90 lb (40.8 kg)  Height: 5' 3.25" (1.607 m)   Body mass index is 15.82 kg/m.  Advanced Directives 05/14/2020 05/12/2019 05/10/2018 12/17/2016 05/26/2016  Does Patient Have a Medical Advance Directive? Yes Yes Yes Yes Yes  Type of Paramedic of [place];Living will [place];Living will [place];Living will [place];Living will [place];Living will  Does patient want to make changes to medical advance directive? No - Patient declined No - Patient declined No - Patient declined No - Patient declined -  Copy of [place] in Chart? No - copy requested No - copy requested No - copy requested No - copy requested -    Current Medications (verified) Outpatient Encounter Medications as of 05/14/2020  Medication Sig   ASPIRIN 81 PO    cholecalciferol (VITAMIN D) 1000 UNITS tablet Take 1,000 Units by mouth daily.   cyanocobalamin (,VITAMIN B-12,) 1000 MCG/ML injection AS DIRECTED   Cyanocobalamin (VITAMIN B 12 PO) Take by mouth.   promethazine-dextromethorphan (PROMETHAZINE-DM) 6.25-15 MG/5ML syrup Take 5 mLs by mouth 4 (four) times daily as needed for cough.   SYNTHROID 50 MCG tablet TAKE ONE TABLET BY MOUTH DAILY ON AN EMPTY STOMACH WITH A FULL GLASS OF WATER AT LEAST 30-60 MINUTES BEFORE BREAKFAST   Syringe/Needle, Disp, (SYRINGE 3CC/27GX1-1/4") 27G X 1-1/4" 3 ML MISC For B12 injections   telmisartan (MICARDIS) 20 MG tablet Take 0.5 tablets (10 mg total) by mouth daily.   traMADol (ULTRAM) 50 MG tablet Take 1 tablet (50 mg total) by mouth every 8 (eight)  hours as needed.   tretinoin (RETIN-A) 0.025 % cream APPLY TOPICALLY AT BEDTIME AS DIRECTED   Facility-Administered Encounter Medications as of 05/14/2020  Medication   0.9 %  sodium chloride infusion    Allergies (verified) Voltaren [diclofenac], Adhesive [tape], Demerol, Macrodantin, Nsaids, Voltaren [diclofenac sodium], Codeine, Nickel, Penicillins, and Sulfa antibiotics   History: Past Medical History:  Diagnosis Date   Aberrant right subclavian artery    Cataract    surgery   Clotting disorder (HCC)    hx of bleeding after vaginal delivery, dr told her not to have any surgeries that are necessary   Dysplasia, artery, fibromuscular, renal ([place])    Fibromuscular dysplasia ([place])    Fibromuscular dysplasia of renal artery ([place])    managed by Dr. 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[place])    Dr Mervyn Skeeters   Paget's disease of the bone    Pernicious anemia    Thyroid disease    Urinary incontinence, mixed    resolved with removal of ained sutures in bladder    Past Surgical History:  Procedure Laterality Date   BILATERAL SALPINGOOPHORECTOMY     CATARACT EXTRACTION  Sep 10, 2011, Jan 13,2013   COLONOSCOPY     RENAL BIOPSY     TONSILLECTOMY     VAGINAL DELIVERY     x2   VAGINAL  HYSTERECTOMY     WISDOM TOOTH EXTRACTION     Family History  Problem Relation Age of Onset   Anuerysm Mother    COPD Mother    Hypertension Mother    Heart disease Mother    Heart failure Mother    Cancer Father        lung CA   Heart disease Father    Heart attack Father    Heart disease Brother        viral cardiomyopathy, cocaine use   Drug abuse Brother    Cancer Maternal Aunt        esophageal and live rCA,  hisotory of  ETOH and tobacco   Anuerysm Maternal Grandfather    Cancer Paternal Grandmother        brain tumor   Diabetes Sister    Colon cancer  Paternal Uncle    Social History   Socioeconomic History   Marital status: Married    Spouse name: Not on file   Number of children: 2   Years of education: Not on file   Highest education level: Not on file  Occupational History   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  Tobacco Use   Smoking status: Never Smoker   Smokeless tobacco: Never Used  Scientific laboratory technician Use: Never used  Substance and Sexual Activity   Alcohol use: Yes    Comment: once a year   Drug use: No   Sexual activity: Not Currently  Other Topics Concern   Not on file  Social History Narrative   Not on file   Social Determinants of Health   Financial Resource Strain: Low Risk    Difficulty of Paying Living Expenses: Not hard at all  Food Insecurity: No Food Insecurity   Worried About Charity fundraiser in the Last Year: Never true   [place] in the Last Year: Never true  Transportation Needs: No Transportation Needs   Lack of Transportation (Medical): No   Lack of Transportation (Non-Medical): No  Physical Activity:    Days of Exercise per Week:    Minutes of Exercise per Session:   Stress: No Stress Concern Present   Feeling of Stress : Not at all  Social Connections: Unknown   Frequency of Communication with Friends and Family: Not on file   Frequency of Social Gatherings with Friends and Family: Not on file   Attends Religious Services: Not on file   Active Member of Clubs or Organizations: Not on file   Attends Archivist Meetings: Not on file   Marital Status: Married    Tobacco Counseling Counseling given: Not Answered   Clinical Intake:  Pre-visit preparation completed: Yes        Diabetes: No            Activities of Daily Living In your present state of health, do you have any difficulty performing the following activities: 05/14/2020  Hearing? N  Vision? N  Difficulty concentrating or making decisions? N  Walking or climbing stairs? N  Dressing or  bathing? N  Doing errands, shopping? N  Preparing Food and eating ? N  Using the Toilet? N  In the past six months, have you accidently leaked urine? N  Do you have problems with loss of bowel control? N  Managing your Medications? N  Managing your Finances? N  Housekeeping or managing your Housekeeping? N  Some recent data might be hidden    Patient Care Team:  Crecencio Mc, MD as PCP - General (Internal Medicine) Kiser, Kerrie [place], MD (Nephrology) Minna Merritts, MD as Consulting Physician (Cardiology)  Indicate any recent Medical Services you may have received from other than [facility] providers in the past year (date may be approximate).     Assessment:   This is a routine wellness examination for Kaleb.  I connected with Jocelyn Lamer today by telephone and verified that I am speaking with the correct person using two identifiers. Location patient: home Location provider: work Persons participating in the virtual visit: patient, Marine scientist.    I discussed the limitations, risks, security and privacy concerns of performing an evaluation and management service by telephone and the availability of in person appointments. The patient expressed understanding and verbally consented to this telephonic visit.    Interactive audio and video telecommunications were attempted between this provider and patient, however failed, due to patient having technical difficulties OR patient did not have access to video capability.  We continued and completed visit with audio only.  Some vital signs may be absent or patient reported.  Patient spot checks BP. No longer taking antihypertensive medication.   Hearing/Vision screen  Hearing Screening   125Hz  250Hz  500Hz  1000Hz  2000Hz  3000Hz  4000Hz  6000Hz  8000Hz   Right ear:           Left ear:           Comments: Patient is able to hear conversational tones without difficulty.  No issues reported.  Vision Screening Comments: Followed by [facility] Wears corrective lenses Cataract extraction, bilateral Glaucoma; suspect Visual acuity not assessed, virtual visit.  They have seen their ophthalmologist in the last 12 months.    Dietary issues and exercise activities discussed: Current Exercise Habits: Home exercise routine  High protein/low carb Ensure protein drink daily Good water intake  Goals      Follow up with Primary Care Provider     As needed       Depression Screen PHQ 2/9 Scores 05/14/2020 05/12/2019 05/10/2018 12/17/2016 03/19/2016 12/13/2014 08/29/2013  PHQ - 2 Score 0 0 0 0 0 0 0    Fall Risk Fall Risk  05/14/2020 05/12/2019 05/10/2018 04/28/2018 12/17/2016  Falls in the past year? 0 0 No No Yes  Comment - - - Emmi Telephone Survey: data to providers prior to load -  Number falls in past yr: 0 - - - 1  Injury with Fall? - - - - No  Follow up Falls evaluation completed - - - Falls prevention discussed   Handrails in use when climbing stairs?  Yes  Home free of loose throw rugs in walkways, pet beds, electrical cords, etc? Yes  Adequate lighting in your home to reduce risk of falls? Yes   TIMED UP AND GO: Was the test performed? No . Virtual visit.   Cognitive Function: Patient is alert and oriented x3.  Currently works 4 days weekly as [place] Exam 05/14/2020 05/10/2018 12/17/2016  Not completed: Unable to complete - -  Orientation to time - 5 5  Orientation to Place - 5 5  Registration - 3 3  Attention/ Calculation - 5 5  Recall - 3 3  Language- name 2 objects - 2 2  Language- repeat - 1 1  Language- follow 3 step command - 3 3  Language- read & follow direction - 1 1  Write a sentence - 1 1  Copy design - 1 1  Total score -  30 30     6CIT Screen 05/12/2019  What Year? 0 points  What month? 0 points  What time? 0 points  Count back from 20 0 points  Months in reverse 0 points  Repeat phrase 0 points  Total Score 0    Immunizations Immunization History   Administered Date(s) Administered   Fluad Quad(high Dose 65+) 05/30/2019   Influenza Nasal 07/09/2017   Influenza Split 07/13/2012, 07/13/2014   Influenza, High Dose Seasonal PF 07/21/2018   Influenza-Unspecified 07/04/2013, 06/30/2015, 07/14/2016   PPD Test 11/19/2015   Pneumococcal Conjugate-13 07/28/2013   Pneumococcal Polysaccharide-23 07/07/2003, 07/06/2014   Tdap 07/14/2013   Zoster 09/14/2008   Zoster Recombinat (Shingrix) 04/10/2018, 06/07/2018    Health Maintenance Health Maintenance  Topic Date Due   INFLUENZA VACCINE  08/14/2020 (Originally 04/29/2020)   MAMMOGRAM  03/16/2021   TETANUS/TDAP  07/15/2023   COLONOSCOPY  05/26/2026   DEXA SCAN  Completed   COVID-19 Vaccine  Completed   Hepatitis C Screening  Completed   PNA vac Low Risk Adult  Completed   Influenza vaccine- out of stock  Dental Screening: Recommended annual dental exams for proper oral hygiene. Visits every 6 months.   Community Resource Referral / Chronic Care Management: CRR required this visit?  No   CCM required this visit?  No      Plan:   Keep all routine maintenance appointments.   Annual follow up 05/25/20 @ 10:00  I have personally reviewed and noted the following in the patient's chart:   Medical and social history Use of alcohol, tobacco or illicit drugs  Current medications and supplements Functional ability and status Nutritional status Physical activity Advanced directives List of other physicians Hospitalizations, surgeries, and ER visits in previous 12 months Vitals Screenings to include cognitive, depression, and falls Referrals and appointments  In addition, I have reviewed and discussed with patient certain preventive protocols, quality metrics, and best practice recommendations. A written personalized care plan for preventive services as well as general preventive health recommendations were provided to patient.     OBrien-Blaney, Allen Egerton L, LPN   4/97/0263       I have reviewed the above information and agree with above.   Deborra Medina, MD

## 2020-05-14 NOTE — Patient Instructions (Addendum)
Hailey Hunt , Thank you for taking time to come for your Medicare Wellness Visit. I appreciate your ongoing commitment to your health goals. Please review the following plan we discussed and let me know if I can assist you in the future.   These are the goals we discussed: Goals     Follow up with Primary Care Provider     As needed       This is a list of the screening recommended for you and due dates:  Health Maintenance  Topic Date Due   Flu Shot  08/14/2020*   Mammogram  03/16/2021   Tetanus Vaccine  07/15/2023   Colon Cancer Screening  05/26/2026   DEXA scan (bone density measurement)  Completed   COVID-19 Vaccine  Completed    Hepatitis C: One time screening is recommended by Center for Disease Control  (CDC) for  adults born from [birth year] through 1963.   Completed   Pneumonia vaccines  Completed  *Topic was postponed. The date shown is not the original due date.    Immunizations Immunization History  Administered Date(s) Administered   Fluad Quad(high Dose 65+) 05/30/2019   Influenza Nasal 07/09/2017   Influenza Split 07/13/2012, 07/13/2014   Influenza, High Dose Seasonal PF 07/21/2018   Influenza-Unspecified 07/04/2013, 06/30/2015, 07/14/2016   PPD Test 11/19/2015   Pneumococcal Conjugate-13 07/28/2013   Pneumococcal Polysaccharide-23 07/07/2003, 07/06/2014   Tdap 07/14/2013   Zoster 09/14/2008   Zoster Recombinat (Shingrix) 04/10/2018, 06/07/2018   Keep all routine maintenance appointments.   Annual follow up 05/25/20 @ 10:00  Advanced directives: End of life planning; Advance aging; Advanced directives discussed.  Copy of current HCPOA/Living Will requested.    Conditions/risks identified: none new  Follow up in one year for your annual wellness visit    Preventive Care 65 Years and Older, Female Preventive care refers to lifestyle choices and visits with your health care provider that can promote health and wellness. What does  preventive care include?  A yearly physical exam. This is also called an annual well check.  Dental exams once or twice a year.  Routine eye exams. Ask your health care provider how often you should have your eyes checked.  Personal lifestyle choices, including:  Daily care of your teeth and gums.  Regular physical activity.  Eating a healthy diet.  Avoiding tobacco and drug use.  Limiting alcohol use.  Practicing safe sex.  Taking low-dose aspirin every day.  Taking vitamin and mineral supplements as recommended by your health care provider. What happens during an annual well check? The services and screenings done by your health care provider during your annual well check will depend on your age, overall health, lifestyle risk factors, and family history of disease. Counseling  Your health care provider may ask you questions about your:  Alcohol use.  Tobacco use.  Drug use.  Emotional well-being.  Home and relationship well-being.  Sexual activity.  Eating habits.  History of falls.  Memory and ability to understand (cognition).  Work and work Statistician.  Reproductive health. Screening  You may have the following tests or measurements:  Height, weight, and BMI.  Blood pressure.  Lipid and cholesterol levels. These may be checked every 5 years, or more frequently if you are over 72 years old.  Skin check.  Lung cancer screening. You may have this screening every year starting at age 72 if you have a 30-pack-year history of smoking and currently smoke or have quit within the past 15  years.  Fecal occult blood test (FOBT) of the stool. You may have this test every year starting at age 72.  Flexible sigmoidoscopy or colonoscopy. You may have a sigmoidoscopy every 5 years or a colonoscopy every 10 years starting at age 72.  Hepatitis C blood test.  Hepatitis B blood test.  Sexually transmitted disease (STD) testing.  Diabetes screening. This  is done by checking your blood sugar (glucose) after you have not eaten for a while (fasting). You may have this done every 1-3 years.  Bone density scan. This is done to screen for osteoporosis. You may have this done starting at age 72.  Mammogram. This may be done every 1-2 years. Talk to your health care provider about how often you should have regular mammograms. Talk with your health care provider about your test results, treatment options, and if necessary, the need for more tests. Vaccines  Your health care provider may recommend certain vaccines, such as:  Influenza vaccine. This is recommended every year.  Tetanus, diphtheria, and acellular pertussis (Tdap, Td) vaccine. You may need a Td booster every 10 years.  Zoster vaccine. You may need this after age 72.  Pneumococcal 13-valent conjugate (PCV13) vaccine. One dose is recommended after age 72.  Pneumococcal polysaccharide (PPSV23) vaccine. One dose is recommended after age 72. Talk to your health care provider about which screenings and vaccines you need and how often you need them. This information is not intended to replace advice given to you by your health care provider. Make sure you discuss any questions you have with your health care provider. Document Released: 10/12/2015 Document Revised: 06/04/2016 Document Reviewed: 07/17/2015 Elsevier Interactive Patient Education  2017 [place] Prevention in the Home Falls can cause injuries. They can happen to people of all ages. There are many things you can do to make your home safe and to help prevent falls. What can I do on the outside of my home?  Regularly fix the edges of walkways and driveways and fix any cracks.  Remove anything that might make you trip as you walk through a door, such as a raised step or threshold.  Trim any bushes or trees on the path to your home.  Use bright outdoor lighting.  Clear any walking paths of anything that might make  someone trip, such as rocks or tools.  Regularly check to see if handrails are loose or broken. Make sure that both sides of any steps have handrails.  Any raised decks and porches should have guardrails on the edges.  Have any leaves, snow, or ice cleared regularly.  Use sand or salt on walking paths during winter.  Clean up any spills in your garage right away. This includes oil or grease spills. What can I do in the bathroom?  Use night lights.  Install grab bars by the toilet and in the tub and shower. Do not use towel bars as grab bars.  Use non-skid mats or decals in the tub or shower.  If you need to sit down in the shower, use a plastic, non-slip stool.  Keep the floor dry. Clean up any water that spills on the floor as soon as it happens.  Remove soap buildup in the tub or shower regularly.  Attach bath mats securely with double-sided non-slip rug tape.  Do not have throw rugs and other things on the floor that can make you trip. What can I do in the bedroom?  Use night lights.  Make  sure that you have a light by your bed that is easy to reach.  Do not use any sheets or blankets that are too big for your bed. They should not hang down onto the floor.  Have a firm chair that has side arms. You can use this for support while you get dressed.  Do not have throw rugs and other things on the floor that can make you trip. What can I do in the kitchen?  Clean up any spills right away.  Avoid walking on wet floors.  Keep items that you use a lot in easy-to-reach places.  If you need to reach something above you, use a strong step stool that has a grab bar.  Keep electrical cords out of the way.  Do not use floor polish or wax that makes floors slippery. If you must use wax, use non-skid floor wax.  Do not have throw rugs and other things on the floor that can make you trip. What can I do with my stairs?  Do not leave any items on the stairs.  Make sure that  there are handrails on both sides of the stairs and use them. Fix handrails that are broken or loose. Make sure that handrails are as long as the stairways.  Check any carpeting to make sure that it is firmly attached to the stairs. Fix any carpet that is loose or worn.  Avoid having throw rugs at the top or bottom of the stairs. If you do have throw rugs, attach them to the floor with carpet tape.  Make sure that you have a light switch at the top of the stairs and the bottom of the stairs. If you do not have them, ask someone to add them for you. What else can I do to help prevent falls?  Wear shoes that:  Do not have high heels.  Have rubber bottoms.  Are comfortable and fit you well.  Are closed at the toe. Do not wear sandals.  If you use a stepladder:  Make sure that it is fully opened. Do not climb a closed stepladder.  Make sure that both sides of the stepladder are locked into place.  Ask someone to hold it for you, if possible.  Clearly mark and make sure that you can see:  Any grab bars or handrails.  First and last steps.  Where the edge of each step is.  Use tools that help you move around (mobility aids) if they are needed. These include:  Canes.  Walkers.  Scooters.  Crutches.  Turn on the lights when you go into a dark area. Replace any light bulbs as soon as they burn out.  Set up your furniture so you have a clear path. Avoid moving your furniture around.  If any of your floors are uneven, fix them.  If there are any pets around you, be aware of where they are.  Review your medicines with your doctor. Some medicines can make you feel dizzy. This can increase your chance of falling. Ask your doctor what other things that you can do to help prevent falls. This information is not intended to replace advice given to you by your health care provider. Make sure you discuss any questions you have with your health care provider. Document Released:  07/12/2009 Document Revised: 02/21/2016 Document Reviewed: 10/20/2014 Elsevier Interactive Patient Education  2017 [employer].

## 2020-05-25 ENCOUNTER — Encounter: Payer: Self-pay | Admitting: Internal Medicine

## 2020-05-25 ENCOUNTER — Ambulatory Visit (INDEPENDENT_AMBULATORY_CARE_PROVIDER_SITE_OTHER): Payer: Medicare Other | Admitting: Internal Medicine

## 2020-05-25 ENCOUNTER — Other Ambulatory Visit: Payer: Self-pay

## 2020-05-25 ENCOUNTER — Ambulatory Visit (INDEPENDENT_AMBULATORY_CARE_PROVIDER_SITE_OTHER): Payer: Medicare Other

## 2020-05-25 VITALS — BP 102/64 | HR 67 | Temp 97.4°F | Resp 14 | Ht 63.25 in | Wt 93.8 lb

## 2020-05-25 DIAGNOSIS — R7303 Prediabetes: Secondary | ICD-10-CM

## 2020-05-25 DIAGNOSIS — R059 Cough, unspecified: Secondary | ICD-10-CM

## 2020-05-25 DIAGNOSIS — I150 Renovascular hypertension: Secondary | ICD-10-CM

## 2020-05-25 DIAGNOSIS — R05 Cough: Secondary | ICD-10-CM | POA: Diagnosis not present

## 2020-05-25 DIAGNOSIS — R058 Other specified cough: Secondary | ICD-10-CM

## 2020-05-25 DIAGNOSIS — J3089 Other allergic rhinitis: Secondary | ICD-10-CM

## 2020-05-25 DIAGNOSIS — R911 Solitary pulmonary nodule: Secondary | ICD-10-CM

## 2020-05-25 DIAGNOSIS — I701 Atherosclerosis of renal artery: Secondary | ICD-10-CM

## 2020-05-25 DIAGNOSIS — E890 Postprocedural hypothyroidism: Secondary | ICD-10-CM

## 2020-05-25 DIAGNOSIS — E785 Hyperlipidemia, unspecified: Secondary | ICD-10-CM | POA: Diagnosis not present

## 2020-05-25 LAB — TSH: TSH: 1.34 u[IU]/mL (ref 0.35–4.50)

## 2020-05-25 LAB — COMPREHENSIVE METABOLIC PANEL
ALT: 11 U/L (ref 0–35)
AST: 18 U/L (ref 0–37)
Albumin: 4.3 g/dL (ref 3.5–5.2)
Alkaline Phosphatase: 62 U/L (ref 39–117)
BUN: 23 mg/dL (ref 6–23)
CO2: 28 mEq/L (ref 19–32)
Calcium: 9.3 mg/dL (ref 8.4–10.5)
Chloride: 105 mEq/L (ref 96–112)
Creatinine, Ser: 0.98 mg/dL (ref 0.40–1.20)
GFR: 55.82 mL/min — ABNORMAL LOW (ref >60.00)
Glucose, Bld: 90 mg/dL (ref 70–99)
Potassium: 4.4 mEq/L (ref 3.5–5.1)
Sodium: 141 mEq/L (ref 135–145)
Total Bilirubin: 0.7 mg/dL (ref 0.2–1.2)
Total Protein: 6.4 g/dL (ref 6.0–8.3)

## 2020-05-25 LAB — LIPID PANEL
Cholesterol: 177 mg/dL (ref 0–200)
HDL: 74.7 mg/dL (ref >39.00)
LDL Cholesterol: 86 mg/dL (ref 0–99)
NonHDL: 102.2
Total CHOL/HDL Ratio: 2
Triglycerides: 80 mg/dL (ref 0.0–149.0)
VLDL: 16 mg/dL (ref 0.0–40.0)

## 2020-05-25 LAB — HEMOGLOBIN A1C: Hgb A1c MFr Bld: 6 % (ref 4.6–6.5)

## 2020-05-25 MED ORDER — OMEPRAZOLE 40 MG PO CPDR
40.0000 mg | DELAYED_RELEASE_CAPSULE | Freq: Every day | ORAL | 3 refills | Status: DC
Start: 2020-05-25 — End: 2022-10-30

## 2020-05-25 NOTE — Progress Notes (Signed)
Patient ID: Hailey Hunt, female    DOB: Oct 23, 1945  Age: 74 y.o. MRN: [id number]  The patient is here for annual follow up and examination and management of other chronic and acute problems.  This visit occurred during the SARS-CoV-2 public health emergency.  Safety protocols were in place, including screening questions prior to the visit, additional usage of staff PPE, and extensive cleaning of exam room while observing appropriate contact time as indicated for disinfecting solutions.    Patient has received both doses of the available COVID 19 vaccine without complications.  Patient continues to mask when outside of the home except when walking in yard or at safe distances from others .  Patient denies any change in mood or development of unhealthy behaviors resuting from the pandemic's restriction of activities and socialization.     The risk factors are reflected in the social history.  The roster of all physicians providing medical care to patient - is listed in the Snapshot section of the chart.  Activities of daily living:  The patient is 100% independent in all ADLs: dressing, toileting, feeding as well as independent mobility  Home safety : The patient has smoke detectors in the home. They wear seatbelts.  There are no firearms at home. There is no violence in the home.   There is no risks for hepatitis, STDs or HIV. There is no   history of blood transfusion. They have no travel history to infectious disease endemic areas of the world.  The patient has seen their dentist in the last six month. They have seen their eye doctor in the last year. They admit to slight hearing difficulty with regard to whispered voices and some television programs.  They have deferred audiologic testing in the last year.  They do not  have excessive sun exposure. Discussed the need for sun protection: hats, long sleeves and use of sunscreen if there is significant sun exposure.   Diet: the importance of a  healthy diet is discussed. They do have a healthy diet.  The benefits of regular aerobic exercise were discussed. She walks 4 times per week ,  20 minutes.   Depression screen: there are no signs or vegative symptoms of depression- irritability, change in appetite, anhedonia, sadness/tearfullness.  Cognitive assessment: the patient manages all their financial and personal affairs and is actively engaged. They could relate day,date,year and events; recalled 2/3 objects at 3 minutes; performed clock-face test normally.  The following portions of the patient's history were reviewed and updated as appropriate: allergies, current medications, past family history, past medical history,  past surgical history, past social history  and problem list.  Visual acuity was not assessed per patient preference since she has regular follow up with her ophthalmologist. Hearing and body mass index were assessed and reviewed.   During the course of the visit the patient was educated and counseled about appropriate screening and preventive services including : fall prevention , diabetes screening, nutrition counseling, colorectal cancer screening, and recommended immunizations.    CC: The primary encounter diagnosis was Prediabetes. 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  1) persistent cough , nonproductive,  Day and night.  Present for over 6 months.  No dyspnea, orthopnea, wheezing,  Lower extremity edema.  No rhinitis,  sneezing or esophageal burning.   2) prediabetes: following a Mediterranean style diet.  Weight stable .  Attempts to follow a low GI diet resulting in inappropriate weight loss   3) insomnia:   Secondary to Whitney's issues..  Improving now that Loree Fee is recovering and gaining weight.   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   She has a past surgical history that includes Cataract extraction (Sep 10, 2011, Jan 13,2013); Renal biopsy; Vaginal delivery; Vaginal hysterectomy; Colonoscopy; Tonsillectomy; Wisdom tooth extraction; and Bilateral salpingoophorectomy.   Her family history includes Anuerysm in her maternal grandfather and mother; COPD in her mother; Cancer in her father, maternal aunt, and paternal grandmother; Colon cancer in her paternal uncle; Diabetes in her sister; Drug abuse in her brother; Heart attack in her father; Heart disease in her brother, father, and mother; Heart failure in her mother; Hypertension in her mother.She reports that she has never smoked. She has never used smokeless tobacco. She reports current alcohol use. She reports that she does not use drugs.  Outpatient Medications Prior to Visit  Medication Sig Dispense Refill  . ASPIRIN 81 PO     . cholecalciferol (VITAMIN D) 1000 UNITS tablet Take 1,000 Units by mouth daily.    . cyanocobalamin (,VITAMIN B-12,) 1000 MCG/ML injection AS DIRECTED 1 mL 11  . Cyanocobalamin (VITAMIN B 12 PO) Take by mouth.    . denosumab (PROLIA) 60 MG/ML SOSY injection     . promethazine-dextromethorphan (PROMETHAZINE-DM) 6.25-15 MG/5ML syrup Take 5 mLs by mouth 4 (four) times daily as needed for cough. 180 mL 1  . SYNTHROID 50 MCG tablet TAKE ONE TABLET BY MOUTH DAILY ON AN EMPTY STOMACH WITH A FULL GLASS  OF WATER AT LEAST 30-60 MINUTES BEFORE BREAKFAST 90 tablet 0  . Syringe/Needle, Disp, (SYRINGE 3CC/27GX1-1/4") 27G X 1-1/4" 3 ML MISC For B12 injections 100 each 0  . traMADol (ULTRAM) 50 MG tablet Take 1 tablet (50 mg total) by mouth every 8 (eight) hours as needed. 30 tablet 3  . tretinoin (RETIN-A) 0.025 % cream APPLY TOPICALLY AT BEDTIME AS DIRECTED 45 g 1  . telmisartan (MICARDIS) 20 MG tablet Take 0.5 tablets (10 mg total) by mouth daily. 45 tablet 1   Facility-Administered Medications Prior to Visit  Medication Dose Route Frequency Provider Last Rate Last Admin  . 0.9 %  sodium chloride infusion  500 mL Intravenous Continuous Pyrtle, Lajuan Lines, MD        Review of Systems  Objective:  BP 102/64 (BP Location: Left Arm, Patient Position: Sitting, Cuff Size: Normal)   Pulse 67   Temp (!) 97.4 F (36.3 C) (Oral)   Resp 14   Ht 5' 3.25" (1.607 m)   Wt 93 lb 12.8 oz (42.5 kg)   SpO2 97%   BMI 16.48 kg/m   Physical Exam    Assessment & Plan:   Problem List Items Addressed This Visit      Unprioritized   Allergic rhinitis    She has no recent history of sneezing or congestion but has a chronic cough which was evaluated today with chest x ray, which was normal.  Will recommend trial of antihistamine if PPI trial fails.  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  Lab Results  Component Value Date   CREATININE 0.98 05/25/2020   Lab Results  Component Value Date   NA 141 05/25/2020   K 4.4 05/25/2020   CL 105 05/25/2020   CO2 28 05/25/2020         Cough in adult patient    ddx includes medication (ARB, PND and GERD)  Will treat for GERD for one month and follow up.  Chest x ray normal  And no evidence of heart disease       Hypothyroidism following radioiodine therapy    Thyroid function is WNL on current dose.  No current changes needed.   Lab Results   Component Value Date   TSH 1.34 05/25/2020   .lastsh       Relevant Orders   TSH (Completed)   Prediabetes - Primary    Fasting glucoses have been repeatedly normal and c peptide is normal as well  Lab Results  Component Value Date   HGBA1C 6.0 05/25/2020         Relevant Orders   Hemoglobin A1c (Completed)   Comprehensive metabolic panel (Completed)   Solitary pulmonary nodule    Benign by serial CT scans,  Done until 2018.  Not evident on today's chest x ray        Other Visit Diagnoses    Dyslipidemia (high LDL; low HDL)       Relevant Orders   Lipid panel (Completed)   Cough present for greater than 3 weeks       Relevant Orders   DG Chest 2 View (Completed)      I have discontinued Saraann B. Gitlin "[name]"'s telmisartan. I am also having her start on omeprazole. Additionally, I am having her maintain her SYRINGE 3CC/27GX1-1/4", cholecalciferol, Cyanocobalamin (VITAMIN B 12 PO), promethazine-dextromethorphan, ASPIRIN 81 PO, cyanocobalamin, traMADol, tretinoin, Synthroid, and denosumab. We will continue to administer sodium chloride.  Meds ordered this encounter  Medications  . omeprazole (PRILOSEC) 40 MG capsule    Sig: Take 1 capsule (40 mg total) by mouth daily.    Dispense:  30 capsule    Refill:  3    Medications Discontinued During This Encounter  Medication Reason  . telmisartan (MICARDIS) 20 MG tablet    A total of 40 minutes was spent with patient more than half of which was spent in counseling patient on the above mentioned issues , reviewing and explaining recent labs and imaging studies done, and coordination of care.  Follow-up: Return in about 6 months (around 11/25/2020).   Crecencio Mc, MD

## 2020-05-25 NOTE — Patient Instructions (Addendum)
I recommend trying melatonin for your insomnia.  It is not a sedative,  But must be taken on  a regular basis to help your internal clock.  Take every evening after dinner start with 5 mg dose   Max effective dose is 10  mg  You can also try using " Headspace".  This  is a phone app that teaches you to meditate and empty your mind of thoughts that interfere with space    Trial of omeprazole 40 mg once daily to see if GERD is causing your cough  Take it once daily,  30 minutes prior to eating    Health Maintenance After Age 65 After age 76, you are at a higher risk for certain long-term diseases and infections as well as injuries from falls. Falls are a major cause of broken bones and head injuries in people who are older than age 21. Getting regular preventive care can help to keep you healthy and well. Preventive care includes getting regular testing and making lifestyle changes as recommended by your health care provider. Talk with your health care provider about:  Which screenings and tests you should have. A screening is a test that checks for a disease when you have no symptoms.  A diet and exercise plan that is right for you. What should I know about screenings and tests to prevent falls? Screening and testing are the best ways to find a health problem early. Early diagnosis and treatment give you the best chance of managing medical conditions that are common after age 98. Certain conditions and lifestyle choices may make you more likely to have a fall. Your health care provider may recommend:  Regular vision checks. Poor vision and conditions such as cataracts can make you more likely to have a fall. If you wear glasses, make sure to get your prescription updated if your vision changes.  Medicine review. Work with your health care provider to regularly review all of the medicines you are taking, including over-the-counter medicines. Ask your health care provider about any side effects  that may make you more likely to have a fall. Tell your health care provider if any medicines that you take make you feel dizzy or sleepy.  Osteoporosis screening. Osteoporosis is a condition that causes the bones to get weaker. This can make the bones weak and cause them to break more easily.  Blood pressure screening. Blood pressure changes and medicines to control blood pressure can make you feel dizzy.  Strength and balance checks. Your health care provider may recommend certain tests to check your strength and balance while standing, walking, or changing positions.  Foot health exam. Foot pain and numbness, as well as not wearing proper footwear, can make you more likely to have a fall.  Depression screening. You may be more likely to have a fall if you have a fear of falling, feel emotionally low, or feel unable to do activities that you used to do.  Alcohol use screening. Using too much alcohol can affect your balance and may make you more likely to have a fall. What actions can I take to lower my risk of falls? General instructions  Talk with your health care provider about your risks for falling. Tell your health care provider if: ? You fall. Be sure to tell your health care provider about all falls, even ones that seem minor. ? You feel dizzy, sleepy, or off-balance.  Take over-the-counter and prescription medicines only as told by your health  care provider. These include any supplements.  Eat a healthy diet and maintain a healthy weight. A healthy diet includes low-fat dairy products, low-fat (lean) meats, and fiber from whole grains, beans, and lots of fruits and vegetables. Home safety  Remove any tripping hazards, such as rugs, cords, and clutter.  Install safety equipment such as grab bars in bathrooms and safety rails on stairs.  Keep rooms and walkways well-lit. Activity   Follow a regular exercise program to stay fit. This will help you maintain your balance. Ask  your health care provider what types of exercise are appropriate for you.  If you need a cane or walker, use it as recommended by your health care provider.  Wear supportive shoes that have nonskid soles. Lifestyle  Do not drink alcohol if your health care provider tells you not to drink.  If you drink alcohol, limit how much you have: ? 0-1 drink a day for women. ? 0-2 drinks a day for men.  Be aware of how much alcohol is in your drink. In the [place] one drink equals one typical bottle of beer (12 oz), one-half glass of wine (5 oz), or one shot of hard liquor (1 oz).  Do not use any products that contain nicotine or tobacco, such as cigarettes and e-cigarettes. If you need help quitting, ask your health care provider. Summary  Having a healthy lifestyle and getting preventive care can help to protect your health and wellness after age 93.  Screening and testing are the best way to find a health problem early and help you avoid having a fall. Early diagnosis and treatment give you the best chance for managing medical conditions that are more common for people who are older than age 18.  Falls are a major cause of broken bones and head injuries in people who are older than age 56. Take precautions to prevent a fall at home.  Work with your health care provider to learn what changes you can make to improve your health and wellness and to prevent falls. This information is not intended to replace advice given to you by your health care provider. Make sure you discuss any questions you have with your health care provider. Document Revised: 01/06/2019 Document Reviewed: 07/29/2017 Elsevier Patient Education  2020 [employer].

## 2020-05-26 DIAGNOSIS — R059 Cough, unspecified: Secondary | ICD-10-CM | POA: Insufficient documentation

## 2020-05-26 NOTE — Assessment & Plan Note (Signed)
ddx includes medication (ARB, PND and GERD)  Will treat for GERD for one month and follow up.  Chest x ray normal  And no evidence of heart disease

## 2020-05-26 NOTE — Assessment & Plan Note (Signed)
Fasting glucoses have been repeatedly normal and c peptide is normal as well  Lab Results  Component Value Date   HGBA1C 6.0 05/25/2020

## 2020-05-26 NOTE — Assessment & Plan Note (Signed)
Well controlled on low dose telmisartan  Renal function stable, no changes today.  Lab Results  Component Value Date   CREATININE 0.98 05/25/2020   Lab Results  Component Value Date   NA 141 05/25/2020   K 4.4 05/25/2020   CL 105 05/25/2020   CO2 28 05/25/2020

## 2020-05-26 NOTE — Assessment & Plan Note (Signed)
Thyroid function is WNL on current dose.  No current changes needed.   Lab Results  Component Value Date   TSH 1.34 05/25/2020   .lastsh

## 2020-05-26 NOTE — Assessment & Plan Note (Signed)
Benign by serial CT scans,  Done until 2018.  Not evident on today's chest x ray

## 2020-05-26 NOTE — Assessment & Plan Note (Signed)
She has no recent history of sneezing or congestion but has a chronic cough which was evaluated today with chest x ray, which was normal.  Will recommend trial of antihistamine if PPI trial fails.

## 2020-06-22 DIAGNOSIS — Z23 Encounter for immunization: Secondary | ICD-10-CM | POA: Diagnosis not present

## 2020-06-29 ENCOUNTER — Other Ambulatory Visit: Payer: Self-pay | Admitting: Internal Medicine

## 2020-07-03 ENCOUNTER — Ambulatory Visit: Payer: Medicare Other | Admitting: Internal Medicine

## 2020-07-10 DIAGNOSIS — I701 Atherosclerosis of renal artery: Secondary | ICD-10-CM | POA: Diagnosis not present

## 2020-07-10 DIAGNOSIS — I773 Arterial fibromuscular dysplasia: Secondary | ICD-10-CM | POA: Diagnosis not present

## 2020-07-10 DIAGNOSIS — R1013 Epigastric pain: Secondary | ICD-10-CM | POA: Diagnosis not present

## 2020-07-14 ENCOUNTER — Other Ambulatory Visit: Payer: Self-pay | Admitting: Internal Medicine

## 2020-07-18 ENCOUNTER — Encounter: Payer: Self-pay | Admitting: Internal Medicine

## 2020-07-18 ENCOUNTER — Ambulatory Visit (INDEPENDENT_AMBULATORY_CARE_PROVIDER_SITE_OTHER): Payer: Medicare Other | Admitting: Internal Medicine

## 2020-07-18 ENCOUNTER — Other Ambulatory Visit: Payer: Self-pay

## 2020-07-18 VITALS — BP 110/60 | HR 75 | Ht 63.0 in | Wt 94.6 lb

## 2020-07-18 DIAGNOSIS — I150 Renovascular hypertension: Secondary | ICD-10-CM | POA: Diagnosis not present

## 2020-07-18 DIAGNOSIS — M8889 Osteitis deformans of multiple sites: Secondary | ICD-10-CM

## 2020-07-18 DIAGNOSIS — M810 Age-related osteoporosis without current pathological fracture: Secondary | ICD-10-CM | POA: Diagnosis not present

## 2020-07-18 DIAGNOSIS — E890 Postprocedural hypothyroidism: Secondary | ICD-10-CM | POA: Diagnosis not present

## 2020-07-18 DIAGNOSIS — I701 Atherosclerosis of renal artery: Secondary | ICD-10-CM

## 2020-07-18 DIAGNOSIS — R7303 Prediabetes: Secondary | ICD-10-CM

## 2020-07-18 NOTE — Progress Notes (Signed)
Patient ID: Hailey Hunt, female   DOB: [birth year], 74 y.o.   MRN: [id number]  This visit occurred during the SARS-CoV-2 public health emergency.  Safety protocols were in place, including screening questions prior to the visit, additional usage of staff PPE, and extensive cleaning of exam room while observing appropriate contact time as indicated for disinfecting solutions.   HPI  Hailey Hunt is a 74 y.o.-year-old female, returning for follow-up for Paget ds, osteopenia/osteoporosis, postablative hypothyroidism, prediabetes.  Last visit 1 year ago.  Reviewed and addended history: She had a CTA abd/pelvis on 06/2015:incidental lesion:  Diffuse patchy sclerosis and cortical thickening of the left iliac bone, favored to represent Paget's disease.  She has had an anteverted L femur since her 74. She has had pain in that his ever since.    Bone scan (01/02/2016): FINDINGS: There is asymmetric increased uptake throughout the left iliac bone. The appearance and distribution is compatible with Paget's disease. Increased uptake in the distribution of the nasal bone is also noted, nonspecific. This could be seen with Paget's disease, fibrous dysplasia or chronic sinusitis. No findings identified to suggest bone metastases. Physiologic uptake is noted within the kidneys and urinary bladder.  IMPRESSION: 1. Increased uptake within the left iliac bone compatible with Paget's disease. 2. Central area of increased uptake in the region of the nasal bone which may also reflect Paget's disease of the skull.  Per alkaline phosphatase levels are in the normal range: Lab Results  Component Value Date   ALKPHOS 62 05/25/2020   ALKPHOS 78 05/23/2019   ALKPHOS 80 11/26/2018   ALKPHOS 71 11/26/2018   ALKPHOS 130 (H) 08/17/2018   ALKPHOS 78 05/10/2018   ALKPHOS 71 12/23/2017   ALKPHOS 82 07/13/2017   ALKPHOS 87 09/18/2016   ALKPHOS 73 11/19/2015  - Osteopenia/osteoporosis:  Reviewed  previous bone density scan reports: Date L1-L4 T score FN T scores FRAX score  12/21/2018 ([place]) -0.5 (-1.6%) RFN: -2.4 LFN: -2.2  (-3%) 10-year MOF: 10.8% 10-year hip fracture risk: 3%  12/16/2016 ([place]) -0.40 RFN:-2.2 LFN:-2.1   01/22/2012 ([facility])  -0.60 LFN: -0.66     No recent fractures or falls.  She had a traumatic vertebral fracture as a child.  She has a history of dizziness, but not recently.  Previous osteoporosis treatments: - Actonel for 2 years. This was apparently stopped due to recurrent kidney stones -last episode 2010.  She did not feel good on this (stomach upset, weakness, fatigue). - Prolia - 08/10/2019, 02/08/2020 - no SEs  Latest vitamin D levels have been normal, she previously had a low level in 2013: Lab Results  Component Value Date   VD25OH 55.06 12/30/2018   VD25OH 60.99 02/02/2018   VD25OH 55.42 07/13/2017   VD25OH 49.37 12/12/2016   VD25OH 49.43 11/19/2015   VD25OH 46.46 08/14/2014   VD25OH 62 08/29/2013   VD25OH 21 (L) 01/20/2012   She has been on 1000 units vitamin D daily for years.  She continues to walk. In the past, D\during this walks, she talks on the telephone with her sister in [place], who is also walking.  She also has an apple watch and completes all the circles every day.  She had surgical menopause at 74 years old.   Pt does not have a FH of osteoporosis.  No history of hypo or hypercalcemia or hyperparathyroidism: Lab Results  Component Value Date   CALCIUM 9.3 05/25/2020   CALCIUM 9.8 07/01/2019   CALCIUM 9.3 05/23/2019   CALCIUM 9.7  12/30/2018   CALCIUM 9.3 11/26/2018   CALCIUM 9.8 08/17/2018   CALCIUM 10.2 05/10/2018   CALCIUM 9.7 02/02/2018   CALCIUM 9.6 12/23/2017   CALCIUM 9.5 12/04/2017   She has a history of Graves' disease status post RAI treatment in the 74.  She developed post ablative hypothyroidism.  Pt is on Synthroid 50 mcg daily, taken: - in am - fasting - at least 30 min from b'fast - no Ca, Fe,  MVI, PPIs - not on Biotin  On vitamin B12 and D.  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; latest BUN/creatinine: Lab Results  Component Value Date   BUN 23 05/25/2020   CREATININE 0.98 05/25/2020   Prediabetes: Reviewed her HbA1c levels: Lab Results  Component Value Date   HGBA1C 6.0 05/25/2020   HGBA1C 5.9 05/23/2019   HGBA1C 5.8 11/26/2018   HGBA1C 5.8 08/17/2018   HGBA1C 6.2 05/10/2018   HGBA1C 5.3 12/21/2017   HGBA1C 5.9 12/20/2017   HGBA1C 5.3 12/19/2017   HGBA1C 5.3 12/19/2017   HGBA1C 5.3 12/18/2017   HGBA1C 5.3 12/18/2017   Investigation for type 1 diabetes was negative: Component     Latest Ref Rng & Units 06/30/2018  C-Peptide     0.80 - 3.85 ng/mL 2.45  Glucose, Plasma     65 - 99 mg/dL 93  ZNT8 Antibodies      negative  Glutamic Acid Decarb Ab     <5 IU/mL <5  Islet Cell Ab     Neg:<1:1 Negative    ROS: Constitutional: no weight gain/no weight loss, no fatigue, no subjective hyperthermia, no subjective hypothermia Eyes: no blurry vision, no xerophthalmia ENT: no sore throat, no nodules palpated in neck, no dysphagia, no odynophagia, no hoarseness Cardiovascular: no CP/no SOB/no palpitations/no leg swelling Respiratory: no cough/no SOB/no wheezing Gastrointestinal: no N/no V/no D/no C/no acid reflux Musculoskeletal: no muscle aches/no joint aches Skin: no rashes, no hair loss Neurological: no tremors/no numbness/no tingling/no dizziness  I reviewed pt's medications, allergies, PMH, social hx, family hx, and changes were documented in the history of present illness. Otherwise, unchanged from my initial visit note.  Past Medical History:  Diagnosis Date  . Aberrant right subclavian artery   . Cataract    surgery  . Clotting disorder (HCC)    hx of bleeding after vaginal delivery,  dr told her not to have any surgeries that are necessary  . Dysplasia, artery, fibromuscular, renal ([place])   . Fibromuscular dysplasia ([place])   . Fibromuscular dysplasia of renal artery (HCC)    managed by Dr. Gregary [facility]  . Graves disease   . Heart murmur   . Heart murmur   . Hypertension   . Kidney stone   . Migraines   . Neuropathy with IgA monoclonal gammopathy (HCC)    Dr Mervyn Skeeters  . Paget's disease of the bone   . Pernicious anemia   . Thyroid disease   . Urinary incontinence, mixed    resolved with removal of ained sutures in bladder    Past Surgical History:  Procedure Laterality Date  . BILATERAL SALPINGOOPHORECTOMY    . CATARACT EXTRACTION  Sep 10, 2011, Jan 13,2013  . COLONOSCOPY    . RENAL BIOPSY    . TONSILLECTOMY    . VAGINAL DELIVERY  x2  . VAGINAL HYSTERECTOMY    . WISDOM TOOTH EXTRACTION     Social History   Socioeconomic History  . Marital status: Married    Spouse name: Not on file  . Number of children: 2  . Years of education: Not on file  . Highest education level: Not on file  Occupational History  . 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 Smoking status: Never Smoker  . Smokeless tobacco: Never Used  Vaping Use  . Vaping Use: Never used  Substance and Sexual Activity  . Alcohol use: Yes    Comment: once a year  . Drug use: No  . Sexual activity: Not Currently  Other Topics Concern  . Not on file  Social History Narrative  . Not on file   Social Determinants of Health   Financial Resource Strain: Low Risk   . Difficulty of Paying Living Expenses: Not hard at all  Food Insecurity: No Food Insecurity  . Worried About Charity fundraiser in the Last Year: Never true  . Ran Out of Food in the Last Year: Never true  Transportation Needs: No Transportation Needs  . Lack of Transportation (Medical): No  . Lack of Transportation (Non-Medical): No  Physical Activity:   . Days of Exercise per Week: Not on file  .  Minutes of Exercise per Session: Not on file  Stress: No Stress Concern Present  . Feeling of Stress : Not at all  Social Connections: Unknown  . Frequency of Communication with Friends and Family: Not on file  . Frequency of Social Gatherings with Friends and Family: Not on file  . Attends Religious Services: Not on file  . Active Member of Clubs or Organizations: Not on file  . Attends Archivist Meetings: Not on file  . Marital Status: Married  Human resources officer Violence: Not At Risk  . Fear of Current or Ex-Partner: No  . Emotionally Abused: No  . Physically Abused: No  . Sexually Abused: No   Current Outpatient Medications on File Prior to Visit  Medication Sig Dispense Refill  . ASPIRIN 81 PO     . cholecalciferol (VITAMIN D) 1000 UNITS tablet Take 1,000 Units by mouth daily.    . cyanocobalamin (,VITAMIN B-12,) 1000 MCG/ML injection AS DIRECTED 1 mL 11  . Cyanocobalamin (VITAMIN B 12 PO) Take by mouth.    . denosumab (PROLIA) 60 MG/ML SOSY injection     . omeprazole (PRILOSEC) 40 MG capsule Take 1 capsule (40 mg total) by mouth daily. 30 capsule 3  . promethazine-dextromethorphan (PROMETHAZINE-DM) 6.25-15 MG/5ML syrup Take 5 mLs by mouth 4 (four) times daily as needed for cough. 180 mL 1  . SYNTHROID 50 MCG tablet TAKE ONE TABLET BY MOUTH DAILY ON AN EMPTY STOMACH WITH A FULL GLASS OF WATER AT LEAST 30-60 MINUTES BEFORE BREAKFAST 90 tablet 1  . Syringe/Needle, Disp, (SYRINGE 3CC/27GX1-1/4") 27G X 1-1/4" 3 ML MISC For B12 injections 100 each 0  . traMADol (ULTRAM) 50 MG tablet Take 1 tablet (50 mg total) by mouth every 8 (eight) hours as needed. 30 tablet 3  . tretinoin (RETIN-A) 0.025 % cream APPLY TOPICALLY AT BEDTIME 45 g 1   Current Facility-Administered Medications on File Prior to Visit  Medication Dose Route Frequency Provider Last Rate Last Admin  . 0.9 %  sodium chloride infusion  500 mL Intravenous Continuous Pyrtle, Lajuan Lines, MD       Allergies  Allergen  Reactions  .  Voltaren [Diclofenac] Hypertension  . Adhesive [Tape]   . Demerol     HYPOTENSION  . Macrodantin   . Nsaids Hypertension  . Voltaren [Diclofenac Sodium] Other (See Comments)    FMD, increased bp  . Codeine Nausea And Vomiting and Rash  . Nickel Rash  . Penicillins Rash  . Sulfa Antibiotics Rash    Fever   Family History  Problem Relation Age of Onset  . Anuerysm Mother   . COPD Mother   . Hypertension Mother   . Heart disease Mother   . Heart failure Mother   . Cancer Father        lung CA  . Heart disease Father   . Heart attack Father   . Heart disease Brother        viral cardiomyopathy, cocaine use  . Drug abuse Brother   . Cancer Maternal Aunt        esophageal and live rCA,  hisotory of  ETOH and tobacco  . Anuerysm Maternal Grandfather   . Cancer Paternal Grandmother        brain tumor  . Diabetes Sister   . Colon cancer Paternal Uncle    PE: BP 110/60   Pulse 75   Ht 5\' 3"  (1.6 m)   Wt 94 lb 9.6 oz (42.9 kg)   SpO2 96%   BMI 16.76 kg/m  Body mass index is 16.76 kg/m. Wt Readings from Last 3 Encounters:  07/18/20 94 lb 9.6 oz (42.9 kg)  05/25/20 93 lb 12.8 oz (42.5 kg)  05/14/20 90 lb (40.8 kg)   Constitutional: thin, in NAD Eyes: PERRLA, EOMI, no exophthalmos ENT: moist mucous membranes, no thyromegaly, no cervical lymphadenopathy Cardiovascular: RRR, No MRG Respiratory: CTA B Gastrointestinal: abdomen soft, NT, ND, BS+ Musculoskeletal: no deformities, strength intact in all 4 Skin: moist, warm, no rashes Neurological: + tremor with outstretched hands, DTR normal in all 4  Assessment: 1. Paget disease  2.  Clinical osteoporosis  3. Postablative hypothyroidism - RAI tx for Graves ds. In 1980s  4.  Prediabetes  Plan: 1. Paget ds. -Stable, without new pain or warmth at the site of her pagetoid lesions -She had a lesion in the left hip observed incidentally on pelvic CTA at [facility].  She [facility] to [facility] for management of her FMD.   This was again noticed on the bone scan from 2017, along with a nasal focus -We discussed that Paget's disease lesions do not need to be treated unless they are painful, contiguous with the joint, or in weightbearing joints.  Also, consideration for treatment should be given in case alkaline phosphatase increases.  Her levels have been stable including at last check 1.5 months ago -We discussed that severe complications like bone cancer, fractures, heart failure, are extremely rare nowadays. -She wanted to avoid bisphosphonates due to previous intolerance -At last visit, we discussed about Prolia and she agreed to start. She had 2 inj's >> tolerates these well.   2.  Clinical osteoporosis -Reviewed latest bone density scan showing slightly worse bone density scores at the femoral necks-however, this did not reach statistical significance.  Her FRAX score revealed a high risk for fracture, confirming clinical osteoporosis.  In this case, treatment is indicated. She agreed to start Prolia in the past, after a discussion about mechanism of action, benefits and possible side effects. -Latest vitamin D level was reviewed and this was normal -She continues on 1000 units vitamin D daily. -We will repeat a vitamin D level  today - continue Prolia >> no jaw, thigh, hip pain - due for DXA in 10/2019 >> advised her to send me  msg so I can order it then  3. Postablative hypothyroidism - latest thyroid labs reviewed with pt >> normal: Lab Results  Component Value Date   TSH 1.34 05/25/2020   - she continues on LT4 50 mcg daily - pt feels good on this dose. - we discussed about taking the thyroid hormone every day, with water, >30 minutes before breakfast, separated by >4 hours from acid reflux medications, calcium, iron, multivitamins. Pt. is taking it correctly.  4.  Prediabetes -Her HbA1c levels fluctuate between the normal range in the prediabetic range -She did adjust her diet by cutting out fast  foods, concentrated sweets and starches.  She lost weight afterwards and this prompted an investigation for type 1 diabetes.  This was negative. -Latest HbA1c was slightly higher 1.5 months ago, at 6.0% -No intervention needed for now, continue exercise (she was less active recently taking care of her daughter, who was sick)  Component     Latest Ref Rng & Units 07/18/2020  Vitamin D, 25-Hydroxy     30.0 - 100.0 ng/mL 38.5   Normal vitamin D.  Philemon Kingdom, MD PhD [facility]

## 2020-07-18 NOTE — Patient Instructions (Addendum)
Please stop at the lab.  Please continue Synthroid 50 mcg daily.  Take the thyroid hormone every day, with water, at least 30 minutes before breakfast, separated by at least 4 hours from: - acid reflux medications - calcium - iron - multivitamins  Please send me a message in March so I can order your next DXA scan.  Please come back for a follow-up appointment in 1 year.

## 2020-07-19 DIAGNOSIS — Z23 Encounter for immunization: Secondary | ICD-10-CM | POA: Diagnosis not present

## 2020-07-19 LAB — VITAMIN D 25 HYDROXY (VIT D DEFICIENCY, FRACTURES): Vit D, 25-Hydroxy: 38.5 ng/mL (ref 30.0–100.0)

## 2020-08-08 ENCOUNTER — Telehealth: Payer: Self-pay | Admitting: Internal Medicine

## 2020-08-08 NOTE — Telephone Encounter (Signed)
Patient has called to check on her next Prolia shot, scheduling etc.  Her last one was 02/08/2020.    Call back number is [phone]

## 2020-08-10 NOTE — Telephone Encounter (Signed)
Waiting for summary of benefits to come back than I will reach out for scheduling

## 2020-08-18 NOTE — Telephone Encounter (Signed)
Pt scheduled and aware

## 2020-08-30 ENCOUNTER — Other Ambulatory Visit: Payer: Self-pay

## 2020-08-30 ENCOUNTER — Ambulatory Visit (INDEPENDENT_AMBULATORY_CARE_PROVIDER_SITE_OTHER): Payer: Medicare Other

## 2020-08-30 DIAGNOSIS — M810 Age-related osteoporosis without current pathological fracture: Secondary | ICD-10-CM

## 2020-08-30 MED ORDER — DENOSUMAB 60 MG/ML ~~LOC~~ SOSY
60.0000 mg | PREFILLED_SYRINGE | Freq: Once | SUBCUTANEOUS | Status: AC
  Administered 2020-08-30: 60 mg via SUBCUTANEOUS

## 2020-08-30 NOTE — Progress Notes (Signed)
Patient received prolia injection.  Tolerated well.

## 2020-09-10 NOTE — Telephone Encounter (Signed)
Patient received Prolia on 08/30/20

## 2020-10-04 ENCOUNTER — Other Ambulatory Visit: Payer: Self-pay

## 2020-10-04 ENCOUNTER — Other Ambulatory Visit: Payer: Self-pay | Admitting: Internal Medicine

## 2020-10-04 MED ORDER — CYANOCOBALAMIN 1000 MCG/ML IJ SOLN
INTRAMUSCULAR | 11 refills | Status: DC
Start: 2020-10-04 — End: 2021-07-18

## 2020-10-17 DIAGNOSIS — Z961 Presence of intraocular lens: Secondary | ICD-10-CM | POA: Diagnosis not present

## 2020-11-13 ENCOUNTER — Other Ambulatory Visit: Payer: Self-pay

## 2020-11-13 ENCOUNTER — Telehealth: Payer: Self-pay

## 2020-11-13 ENCOUNTER — Other Ambulatory Visit (INDEPENDENT_AMBULATORY_CARE_PROVIDER_SITE_OTHER): Payer: Medicare Other

## 2020-11-13 DIAGNOSIS — R300 Dysuria: Secondary | ICD-10-CM | POA: Diagnosis not present

## 2020-11-13 MED ORDER — CIPROFLOXACIN HCL 250 MG PO TABS: 250.0000 mg | ORAL_TABLET | Freq: Two times a day (BID) | ORAL | 0 refills | Status: DC

## 2020-11-13 NOTE — Telephone Encounter (Signed)
Spoke with pt and she stated that she took a home test for a UTI and it came back positive. Pt stated that it shows leukocytes, nitrites, and protein. Pt is wanting to see if an antibiotic could be called in. I explained to pt that she would need an appt to give [place] a urine sample and a telephone/video visit to discuss. There is no available appts with any one in the office this week and pt does not want to go to urgent care. Is there somewhere that would be okay to work patient in or does she just need to go on to urgent care.

## 2020-11-13 NOTE — Telephone Encounter (Signed)
Spoke with pt and scheduled her a lab appt this afternoon and scheduled virtual visit on Thursday.

## 2020-11-13 NOTE — Telephone Encounter (Signed)
If she will drop off a urine for culture,  [facility],  And then I will work her in at 12:30 on Thursday for review of the results (virtual)

## 2020-11-13 NOTE — Telephone Encounter (Signed)
Pt states that she has a UTi-did a home test. There are no avail appts today at the time of her call. She would like you to call her back .

## 2020-11-14 LAB — URINALYSIS, ROUTINE W REFLEX MICROSCOPIC
Bilirubin Urine: NEGATIVE
Ketones, ur: NEGATIVE
Nitrite: NEGATIVE
Specific Gravity, Urine: 1.02 (ref 1.000–1.030)
Total Protein, Urine: 30 — AB
Urine Glucose: NEGATIVE
Urobilinogen, UA: 0.2 (ref 0.0–1.0)
pH: 6.5 (ref 5.0–8.0)

## 2020-11-15 ENCOUNTER — Telehealth (INDEPENDENT_AMBULATORY_CARE_PROVIDER_SITE_OTHER): Payer: Medicare Other | Admitting: Internal Medicine

## 2020-11-15 ENCOUNTER — Encounter: Payer: Self-pay | Admitting: Internal Medicine

## 2020-11-15 DIAGNOSIS — N281 Cyst of kidney, acquired: Secondary | ICD-10-CM

## 2020-11-15 DIAGNOSIS — R102 Pelvic and perineal pain: Secondary | ICD-10-CM

## 2020-11-15 DIAGNOSIS — N390 Urinary tract infection, site not specified: Secondary | ICD-10-CM | POA: Insufficient documentation

## 2020-11-15 NOTE — Assessment & Plan Note (Signed)
Complex/septated , with no significant change on follow up renal ultrasound done at [facility] June 2021

## 2020-11-15 NOTE — Assessment & Plan Note (Signed)
Intermittent,  With abnormal UA /micro.  Symptoms have improved with empiric cipro started on Feb 16  Await final culture data  If negative culture,  Will obtain CT to rule out stones  (last CT was Nov 2020)

## 2020-11-15 NOTE — Progress Notes (Signed)
Virtual Visit via [place]  This visit type was conducted due to national recommendations for restrictions regarding the COVID-19 pandemic (e.g. social distancing).  This format is felt to be most appropriate for this patient at this time.  All issues noted in this document were discussed and addressed.  No physical exam was performed (except for noted visual exam findings with Video Visits).   I connected with@ on 11/15/20 at 12:30 PM EST by a video enabled telemedicine application  and verified that I am speaking with the correct person using two identifiers. Location patient: home Location provider: work or home office Persons participating in the virtual visit: patient, provider  I discussed the limitations, risks, security and privacy concerns of performing an evaluation and management service by telephone and the availability of in person appointments. I also discussed with the patient that there may be a patient responsible charge related to this service. The patient expressed understanding and agreed to proceed.  Reason for visit: positive home test for UTI  HPI:   75 yr old female with history of hematuria, atrophic vaginitis ,recurrent UTI, nephrolithiasis and bilateral complex renal cysts   presents with positive home test for UTI after having several occurrences of intermittent suprapubic pain during the last week .denies nausea, back pain and increased frequency.  Takes cranberry supplement daily     ROS: See pertinent positives and negatives per HPI.  Past Medical History:  Diagnosis Date  . Aberrant right subclavian artery   . Cataract    surgery  . Clotting disorder (HCC)    hx of bleeding after vaginal delivery, dr told her not to have any surgeries that are necessary  . Dysplasia, artery, fibromuscular, renal ([place])   . Fibromuscular dysplasia ([place])   . Fibromuscular dysplasia of renal artery (HCC)    managed by Dr. Gregary [facility]  . Graves disease   . Heart  murmur   . Heart murmur   . Hypertension   . Kidney stone   . Migraines   . Neuropathy with IgA monoclonal gammopathy (HCC)    Dr Mervyn Skeeters  . Paget's disease of the bone   . Pernicious anemia   . Thyroid disease   . Urinary incontinence, mixed    resolved with removal of ained sutures in bladder     Past Surgical History:  Procedure Laterality Date  . BILATERAL SALPINGOOPHORECTOMY    . CATARACT EXTRACTION  Sep 10, 2011, Jan 13,2013  . COLONOSCOPY    . RENAL BIOPSY    . TONSILLECTOMY    . VAGINAL DELIVERY     x2  . VAGINAL HYSTERECTOMY    . WISDOM TOOTH EXTRACTION      Family History  Problem Relation Age of Onset  . Anuerysm Mother   . COPD Mother   . Hypertension Mother   . Heart disease Mother   . Heart failure Mother   . Cancer Father        lung CA  . Heart disease Father   . Heart attack Father   . Heart disease Brother        viral cardiomyopathy, cocaine use  . Drug abuse Brother   . Cancer Maternal Aunt        esophageal and live rCA,  hisotory of  ETOH and tobacco  . Anuerysm Maternal Grandfather   . Cancer Paternal Grandmother        brain tumor  . Diabetes Sister   . Colon cancer Paternal Uncle  SOCIAL HX:  reports that she has never smoked. She has never used smokeless tobacco. She reports current alcohol use. She reports that she does not use drugs.   Current Outpatient Medications:  .  ASPIRIN 81 PO, , Disp: , Rfl:  .  cholecalciferol (VITAMIN D) 1000 UNITS tablet, Take 1,000 Units by mouth daily., Disp: , Rfl:  .  ciprofloxacin (CIPRO) 250 MG tablet, Take 1 tablet (250 mg total) by mouth 2 (two) times daily., Disp: 6 tablet, Rfl: 0 .  cyanocobalamin (,VITAMIN B-12,) 1000 MCG/ML injection, INJECT AS DIRECTED, Disp: 1 mL, Rfl: 11 .  Cyanocobalamin (VITAMIN B 12 PO), Take by mouth., Disp: , Rfl:  .  denosumab (PROLIA) 60 MG/ML SOSY injection, , Disp: , Rfl:  .  omeprazole (PRILOSEC) 40 MG capsule, Take 1 capsule (40 mg total) by mouth daily.,  Disp: 30 capsule, Rfl: 3 .  promethazine-dextromethorphan (PROMETHAZINE-DM) 6.25-15 MG/5ML syrup, Take 5 mLs by mouth 4 (four) times daily as needed for cough., Disp: 180 mL, Rfl: 1 .  SYNTHROID 50 MCG tablet, TAKE ONE TABLET BY MOUTH DAILY ON AN EMPTY STOMACH WITH A FULL GLASS OF WATER AT LEAST 30-60 MINUTES BEFORE BREAKFAST, Disp: 90 tablet, Rfl: 1 .  Syringe/Needle, Disp, (SYRINGE 3CC/27GX1-1/4") 27G X 1-1/4" 3 ML MISC, For B12 injections, Disp: 100 each, Rfl: 0 .  traMADol (ULTRAM) 50 MG tablet, Take 1 tablet (50 mg total) by mouth every 8 (eight) hours as needed., Disp: 30 tablet, Rfl: 3 .  tretinoin (RETIN-A) 0.025 % cream, APPLY TOPICALLY AT BEDTIME, Disp: 45 g, Rfl: 1 .  Varenicline Tartrate (TYRVAYA) 0.03 MG/ACT SOLN, , Disp: , Rfl:   Current Facility-Administered Medications:  .  0.9 %  sodium chloride infusion, 500 mL, Intravenous, Continuous, Pyrtle, Lajuan Lines, MD  EXAM:  VITALS per patient if applicable:  GENERAL: alert, oriented, appears well and in no acute distress  HEENT: atraumatic, conjunttiva clear, no obvious abnormalities on inspection of external nose and ears  NECK: normal movements of the head and neck  LUNGS: on inspection no signs of respiratory distress, breathing rate appears normal, no obvious gross SOB, gasping or wheezing  CV: no obvious cyanosis  MS: moves all visible extremities without noticeable abnormality  PSYCH/NEURO: pleasant and cooperative, no obvious depression or anxiety, speech and thought processing grossly intact  ASSESSMENT AND PLAN:  Discussed the following assessment and plan:  Suprapubic pain, acute  Renal cysts, acquired, bilateral  Suprapubic pain, acute Intermittent,  With abnormal UA /micro.  Symptoms have improved with empiric cipro started on Feb 16  Await final culture data  If negative culture,  Will obtain CT to rule out stones  (last CT was Nov 2020)  Renal cysts, acquired, bilateral Complex/septated , with no  significant change on follow up renal ultrasound done at [facility] June 2021     I discussed the assessment and treatment plan with the patient. The patient was provided an opportunity to ask questions and all were answered. The patient agreed with the plan and demonstrated an understanding of the instructions.   The patient was advised to call back or seek an in-person evaluation if the symptoms worsen or if the condition fails to improve as anticipated.   I spent 20 minutes dedicated to the care of this patient on the date of this encounter to include pre-visit review of his medical history,  Face-to-face time with the patient , and post visit ordering of testing and therapeutics.    Crecencio Mc, MD

## 2020-11-16 LAB — URINE CULTURE
MICRO NUMBER:: 11536403
SPECIMEN QUALITY:: ADEQUATE

## 2020-11-19 MED ORDER — CIPROFLOXACIN HCL 250 MG PO TABS: 250.0000 mg | ORAL_TABLET | Freq: Two times a day (BID) | ORAL | 0 refills | Status: DC

## 2020-11-19 NOTE — Addendum Note (Signed)
Addended by: Crecencio Mc on: 11/19/2020 02:50 PM   Modules accepted: Orders

## 2020-11-19 NOTE — Telephone Encounter (Signed)
Jocelyn Lamer,  Pseudomonas is not likely to be a contaminant, since I have had positive urine cultures for it before,  and since it was sensitive to cipro,  You do not need more than 3 days of antibiotics if your symptoms have resolved.  Let me know if you are having recurrent symptoms and I will send another round  Regards,   Deborra Medina, MD

## 2020-11-26 DIAGNOSIS — H26491 Other secondary cataract, right eye: Secondary | ICD-10-CM | POA: Diagnosis not present

## 2020-11-28 ENCOUNTER — Encounter: Payer: Self-pay | Admitting: Internal Medicine

## 2020-11-28 ENCOUNTER — Ambulatory Visit (INDEPENDENT_AMBULATORY_CARE_PROVIDER_SITE_OTHER): Payer: Medicare Other | Admitting: Internal Medicine

## 2020-11-28 ENCOUNTER — Other Ambulatory Visit: Payer: Self-pay

## 2020-11-28 VITALS — BP 112/66 | HR 68 | Temp 97.3°F | Resp 14 | Ht 63.0 in | Wt 97.0 lb

## 2020-11-28 DIAGNOSIS — R7303 Prediabetes: Secondary | ICD-10-CM | POA: Diagnosis not present

## 2020-11-28 DIAGNOSIS — Z961 Presence of intraocular lens: Secondary | ICD-10-CM | POA: Diagnosis not present

## 2020-11-28 DIAGNOSIS — D510 Vitamin B12 deficiency anemia due to intrinsic factor deficiency: Secondary | ICD-10-CM

## 2020-11-28 DIAGNOSIS — I150 Renovascular hypertension: Secondary | ICD-10-CM | POA: Diagnosis not present

## 2020-11-28 DIAGNOSIS — E441 Mild protein-calorie malnutrition: Secondary | ICD-10-CM

## 2020-11-28 DIAGNOSIS — E559 Vitamin D deficiency, unspecified: Secondary | ICD-10-CM

## 2020-11-28 DIAGNOSIS — N3000 Acute cystitis without hematuria: Secondary | ICD-10-CM

## 2020-11-28 DIAGNOSIS — E890 Postprocedural hypothyroidism: Secondary | ICD-10-CM | POA: Diagnosis not present

## 2020-11-28 DIAGNOSIS — I701 Atherosclerosis of renal artery: Secondary | ICD-10-CM | POA: Diagnosis not present

## 2020-11-28 LAB — URINALYSIS, ROUTINE W REFLEX MICROSCOPIC
Bilirubin Urine: NEGATIVE
Ketones, ur: NEGATIVE
Leukocytes,Ua: NEGATIVE
Nitrite: NEGATIVE
Specific Gravity, Urine: 1.025 (ref 1.000–1.030)
Total Protein, Urine: NEGATIVE
Urine Glucose: NEGATIVE
Urobilinogen, UA: 0.2 (ref 0.0–1.0)
pH: 5.5 (ref 5.0–8.0)

## 2020-11-28 LAB — COMPREHENSIVE METABOLIC PANEL
ALT: 16 U/L (ref 0–35)
AST: 22 U/L (ref 0–37)
Albumin: 4.2 g/dL (ref 3.5–5.2)
Alkaline Phosphatase: 55 U/L (ref 39–117)
BUN: 25 mg/dL — ABNORMAL HIGH (ref 6–23)
CO2: 29 mEq/L (ref 19–32)
Calcium: 9.4 mg/dL (ref 8.4–10.5)
Chloride: 105 mEq/L (ref 96–112)
Creatinine, Ser: 0.89 mg/dL (ref 0.40–1.20)
GFR: 64.82 mL/min (ref >60.00)
Glucose, Bld: 89 mg/dL (ref 70–99)
Potassium: 4.1 mEq/L (ref 3.5–5.1)
Sodium: 141 mEq/L (ref 135–145)
Total Bilirubin: 0.6 mg/dL (ref 0.2–1.2)
Total Protein: 6.2 g/dL (ref 6.0–8.3)

## 2020-11-28 LAB — LIPID PANEL
Cholesterol: 198 mg/dL (ref 0–200)
HDL: 87.1 mg/dL (ref >39.00)
LDL Cholesterol: 98 mg/dL (ref 0–99)
NonHDL: 110.66
Total CHOL/HDL Ratio: 2
Triglycerides: 62 mg/dL (ref 0.0–149.0)
VLDL: 12.4 mg/dL (ref 0.0–40.0)

## 2020-11-28 LAB — HEMOGLOBIN A1C: Hgb A1c MFr Bld: 5.8 % (ref 4.6–6.5)

## 2020-11-28 NOTE — Progress Notes (Signed)
Subjective:  Patient ID: Hailey Hunt, female    DOB: 04/16/1946  Age: 75 y.o. MRN: [id number]  CC: The primary encounter diagnosis was Hypothyroidism following radioiodine therapy. 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  HPI Hailey Hunt presents for 6 month follow up   This visit occurred during the SARS-CoV-2 public health emergency.  Safety protocols were in place, including screening questions prior to the visit, additional usage of staff PPE, and extensive cleaning of exam room while observing appropriate contact time as indicated for disinfecting solutions.   1)  Reviewed recent Pseudomonas UTI Feb 15  Treated  With  cipro symptoms resolved. Source of pseudomonas unclear:  No hot tub,  Sauna or bidet use.  Does not self catheterize.  2) Underwent YAG (laser)  of right eye for cloudy capsule recently   Symptoms improving,  Seeing the debris as floaters.  Vision 20/25.  By Maudie Mercury  at [facility]  . 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  No history fo fractures  4) HTN: Patient is taking her medications as prescribed and notes no adverse effects.  Home BP readings have been done about once per week and are  generally < 130/80 .  She is avoiding added salt in her diet and walking regularly about 3 times per week for exercise  .   Marland KitchenShe feels generally well, and her daughter 's COVID VACCINE mediated decline has plateaued and actually  improved.    Outpatient Medications Prior to Visit  Medication Sig Dispense Refill  . ASPIRIN 81 PO     . cholecalciferol (VITAMIN  D) 1000 UNITS tablet Take 1,000 Units by mouth daily.    . cyanocobalamin (,VITAMIN B-12,) 1000 MCG/ML injection INJECT AS DIRECTED 1 mL 11  . Cyanocobalamin (VITAMIN B 12 PO) Take by mouth.    . denosumab (PROLIA) 60 MG/ML SOSY injection     . omeprazole (PRILOSEC) 40 MG capsule Take 1 capsule (40 mg total) by mouth daily. 30 capsule 3  . prednisoLONE acetate (PRED FORTE) 1 % ophthalmic suspension SMARTSIG:In Eye(s)    . promethazine-dextromethorphan (PROMETHAZINE-DM) 6.25-15 MG/5ML syrup Take 5 mLs by mouth 4 (four) times daily as needed for cough. 180 mL 1  . SYNTHROID 50 MCG tablet TAKE ONE TABLET BY MOUTH DAILY ON AN EMPTY STOMACH WITH A FULL GLASS OF WATER AT LEAST 30-60 MINUTES BEFORE BREAKFAST 90 tablet 1  . Syringe/Needle, Disp, (SYRINGE 3CC/27GX1-1/4") 27G X 1-1/4" 3 ML MISC For B12 injections 100 each 0  . traMADol (ULTRAM) 50 MG tablet Take 1 tablet (50 mg total) by mouth every 8 (eight) hours as needed. 30 tablet 3  . tretinoin (RETIN-A) 0.025 % cream APPLY TOPICALLY AT BEDTIME 45 g 1  . Varenicline Tartrate (TYRVAYA) 0.03 MG/ACT SOLN     . ciprofloxacin (CIPRO) 250 MG tablet Take 1 tablet (250 mg total) by mouth 2 (two) times daily. (Patient not taking: Reported on 11/28/2020) 6 tablet 0  . ciprofloxacin (CIPRO) 250 MG tablet Take 1 tablet (250 mg total) by mouth 2 (two) times daily. (Patient not taking: Reported on 11/28/2020) 10 tablet 0   Facility-Administered Medications Prior to Visit  Medication Dose Route Frequency Provider Last Rate Last Admin  . 0.9 %  sodium chloride infusion  500 mL Intravenous Continuous Pyrtle, Lajuan Lines, MD        Review of Systems;  Patient denies headache, fevers, malaise, unintentional weight loss, skin rash, eye pain, sinus congestion and sinus pain, sore throat, dysphagia,  hemoptysis , cough, dyspnea, wheezing, chest pain, palpitations, orthopnea, edema, abdominal pain, nausea, melena, diarrhea, constipation, flank pain, dysuria, hematuria, urinary   Frequency, nocturia, numbness, tingling, seizures,  Focal weakness, Loss of consciousness,  Tremor, insomnia, depression, anxiety, and suicidal ideation.      Objective:  BP 112/66 (BP Location: Left Arm, Patient Position: Sitting, Cuff Size: Normal)   Pulse 68   Temp (!) 97.3 F (36.3 C) (Oral)   Resp 14   Ht 5\' 3"  (1.6 m)   Wt 97 lb (44 kg)   SpO2 99%   BMI 17.18 kg/m   BP Readings from Last 3 Encounters:  11/28/20 112/66  07/18/20 110/60  05/25/20 102/64    Wt Readings from Last 3 Encounters:  11/28/20 97 lb (44 kg)  11/15/20 94 lb 9.6 oz (42.9 kg)  07/18/20 94 lb 9.6 oz (42.9 kg)    General appearance: alert, cooperative and appears stated age Ears: normal TM's and external ear canals both ears Throat: lips, mucosa, and tongue normal; teeth and gums normal Neck: no adenopathy, no carotid bruit, supple, symmetrical, trachea midline and thyroid not enlarged, symmetric, no tenderness/mass/nodules Back: symmetric, no curvature. ROM normal. No CVA tenderness. Lungs: clear to auscultation bilaterally Heart: regular rate and rhythm, S1, S2 normal, no murmur, click, rub or gallop Abdomen: soft, non-tender; bowel sounds normal; no masses,  no organomegaly Pulses: 2+ and symmetric Skin: Skin color, texture, turgor normal. No rashes or lesions Lymph nodes: Cervical, supraclavicular, and axillary nodes normal.  Lab Results  Component Value Date   HGBA1C 5.8 11/28/2020   HGBA1C 6.0 05/25/2020   HGBA1C 5.9 05/23/2019    Lab Results  Component Value Date   CREATININE 0.89 11/28/2020   CREATININE 0.98 05/25/2020   CREATININE 1.05 (H) 07/01/2019    Lab Results  Component Value Date   WBC 5.8 06/16/2018   HGB 11.6 (L) 06/16/2018   HCT 34.7 (L) 06/16/2018   PLT 269.0 06/16/2018   GLUCOSE 89 11/28/2020   CHOL 198 11/28/2020   TRIG 62.0 11/28/2020   HDL 87.10 11/28/2020   LDLDIRECT 92.0 11/19/2015   LDLCALC 98 11/28/2020   ALT 16 11/28/2020   AST 22 11/28/2020   NA  141 11/28/2020   K 4.1 11/28/2020   CL 105 11/28/2020   CREATININE 0.89 11/28/2020   BUN 25 (H) 11/28/2020   CO2 29 11/28/2020   TSH 1.37 11/28/2020   HGBA1C 5.8 11/28/2020   MICROALBUR 1.8 12/23/2017    DG Bone Density  Result Date: 12/22/2018 Date of study: 12/21/2018 Exam: DUAL X-RAY ABSORPTIOMETRY (DXA) FOR BONE MINERAL DENSITY (BMD) Instrument: [employer] Requesting Provider: Dr. Cruzita Lederer Indication: follow up for low BMD Comparison: 12/16/2016 Clinical data: Pt is a 75 y.o. female without previous history of fracture. On vitamin D. Results:  Lumbar spine L1-L4 Femoral neck (FN) 33% distal radius T-score -0.5 RFN: -2.4 LFN: -2.2 n/a Change in BMD from previous DXA test (%) -1.6% -3.0% n/a (*) statistically significant Assessment: the BMD is low according to the [facility] classification for osteoporosis (see below). Fracture risk: moderate FRAX score: 10 year major osteoporotic risk: 10.8%. 10 year hip fracture risk: 3.0%. The  thresholds for treatment are 20% and 3%, respectively. Comments: the technical quality of the study is good. Evaluation for secondary causes should be considered if clinically indicated. Recommend optimizing calcium (1200 mg/day) and vitamin D (800 IU/day) intake. Followup: Repeat BMD is appropriate after 2 years or after 1-2 years if starting treatment. WHO criteria for diagnosis of osteoporosis in postmenopausal women and in men 64 y/o or older: - normal: T-score -1.0 to + 1.0 - osteopenia/low bone density: T-score between -2.5 and -1.0 - osteoporosis: T-score below -2.5 - severe osteoporosis: T-score below -2.5 with history of fragility fracture Note: although not part of the WHO classification, the presence of a fragility fracture, regardless of the T-score, should be considered diagnostic of osteoporosis, provided other causes for the fracture have been excluded. Treatment: The National Osteoporosis Foundation recommends that treatment be considered in postmenopausal  women and men age 82 or older with: 1. Hip or vertebral (clinical or morphometric) fracture 2. T-score of - 2.5 or lower at the spine or hip 3. 10-year fracture probability by FRAX of at least 20% for a major osteoporotic fracture and 3% for a hip fracture Philemon Kingdom, MD [facility]    Assessment & Plan:   Problem List Items Addressed This Visit      Unprioritized   Pernicious anemia   Vitamin D deficiency   Relevant Orders   VITAMIN D 25 Hydroxy (Vit-D Deficiency, Fractures) (Completed)   Comprehensive metabolic panel (Completed)   Hypothyroidism following radioiodine therapy - Primary   Relevant Orders   Thyroid Panel With TSH (Completed)   Prediabetes   Relevant Orders   Hemoglobin A1c (Completed)   Comprehensive metabolic panel (Completed)   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  Lab Results  Component Value Date   CREATININE 0.89 11/28/2020   Lab Results  Component Value Date   NA 141 11/28/2020   K 4.1 11/28/2020   CL 105 11/28/2020   CO2 29 11/28/2020         Relevant Orders   Lipid panel (Completed)   Mild protein-calorie malnutrition ([place])    Her weight is stable and she has increased her protein intake as much as her dietary restrictions allow/       UTI (urinary tract infection)    Secondary to pseudomonas by culture.  Symptoms of suprapubic and back pain.  Resolved with cipro       Relevant Orders   Urinalysis, Routine w reflex microscopic (Completed)   Urine Culture   Pseudophakia of right eye    Causing blurred /cloudy vision.  S/P YAG capsulotomy by Doyce Para at [facility]  With good results/          I have discontinued Adonis Huguenin B. Pecora "[name]"'s ciprofloxacin and ciprofloxacin. I am also having her maintain her SYRINGE 3CC/27GX1-1/4", cholecalciferol, Cyanocobalamin (VITAMIN B 12 PO), promethazine-dextromethorphan, ASPIRIN  81 PO, traMADol, denosumab, omeprazole, tretinoin, Synthroid, cyanocobalamin, Tyrvaya, and prednisoLONE acetate. We will continue to administer sodium chloride.  No orders of the defined types were placed in this encounter.   Medications Discontinued During This Encounter  Medication Reason  . ciprofloxacin (CIPRO) 250 MG tablet Completed Course  . ciprofloxacin (CIPRO) 250 MG tablet Completed Course    Follow-up: No follow-ups on file.   Crecencio Mc, MD

## 2020-11-28 NOTE — Patient Instructions (Signed)
Good to see you!      no changes today    Take home a urine specimen container to collect urine next time you develop symptoms

## 2020-11-29 DIAGNOSIS — Z961 Presence of intraocular lens: Secondary | ICD-10-CM | POA: Insufficient documentation

## 2020-11-29 LAB — URINE CULTURE
MICRO NUMBER:: 11597918
SPECIMEN QUALITY:: ADEQUATE

## 2020-11-29 LAB — THYROID PANEL WITH TSH
Free Thyroxine Index: 2.9 (ref 1.4–3.8)
T3 Uptake: 30 % (ref 22–35)
T4, Total: 9.7 ug/dL (ref 5.1–11.9)
TSH: 1.37 mIU/L (ref 0.40–4.50)

## 2020-11-29 LAB — VITAMIN D 25 HYDROXY (VIT D DEFICIENCY, FRACTURES): VITD: 52.64 ng/mL (ref 30.00–100.00)

## 2020-11-29 NOTE — Assessment & Plan Note (Signed)
Well controlled on low dose telmisartan  Renal function stable, no changes today.  Lab Results  Component Value Date   CREATININE 0.89 11/28/2020   Lab Results  Component Value Date   NA 141 11/28/2020   K 4.1 11/28/2020   CL 105 11/28/2020   CO2 29 11/28/2020

## 2020-11-29 NOTE — Assessment & Plan Note (Signed)
Causing blurred /cloudy vision.  S/P YAG capsulotomy by Doyce Para at [facility]  With good results/

## 2020-11-29 NOTE — Assessment & Plan Note (Signed)
Secondary to pseudomonas by culture.  Symptoms of suprapubic and back pain.  Resolved with cipro

## 2020-11-29 NOTE — Assessment & Plan Note (Signed)
Her weight is stable and she has increased her protein intake as much as her dietary restrictions allow/

## 2020-11-30 ENCOUNTER — Other Ambulatory Visit: Payer: Self-pay | Admitting: Internal Medicine

## 2020-11-30 ENCOUNTER — Encounter: Payer: Self-pay | Admitting: Internal Medicine

## 2020-11-30 DIAGNOSIS — M810 Age-related osteoporosis without current pathological fracture: Secondary | ICD-10-CM

## 2020-12-07 ENCOUNTER — Other Ambulatory Visit: Payer: Self-pay | Admitting: Internal Medicine

## 2020-12-16 ENCOUNTER — Other Ambulatory Visit: Payer: Self-pay | Admitting: Internal Medicine

## 2020-12-16 DIAGNOSIS — N3946 Mixed incontinence: Secondary | ICD-10-CM

## 2020-12-16 DIAGNOSIS — N390 Urinary tract infection, site not specified: Secondary | ICD-10-CM

## 2020-12-16 DIAGNOSIS — R3129 Other microscopic hematuria: Secondary | ICD-10-CM

## 2020-12-20 DIAGNOSIS — H26491 Other secondary cataract, right eye: Secondary | ICD-10-CM | POA: Diagnosis not present

## 2020-12-21 ENCOUNTER — Ambulatory Visit (INDEPENDENT_AMBULATORY_CARE_PROVIDER_SITE_OTHER): Payer: Medicare Other | Admitting: *Deleted

## 2020-12-21 ENCOUNTER — Telehealth: Payer: Self-pay

## 2020-12-21 ENCOUNTER — Other Ambulatory Visit: Payer: Self-pay | Admitting: Internal Medicine

## 2020-12-21 ENCOUNTER — Other Ambulatory Visit: Payer: Self-pay

## 2020-12-21 DIAGNOSIS — R300 Dysuria: Secondary | ICD-10-CM

## 2020-12-21 LAB — POCT URINALYSIS DIPSTICK
Bilirubin, UA: NEGATIVE
Glucose, UA: NEGATIVE
Ketones, UA: NEGATIVE
Nitrite, UA: NEGATIVE
Protein, UA: NEGATIVE
Spec Grav, UA: 1.01 (ref 1.010–1.025)
Urobilinogen, UA: 0.2 E.U./dL
pH, UA: 5.5 (ref 5.0–8.0)

## 2020-12-21 MED ORDER — CIPROFLOXACIN HCL 250 MG PO TABS: 250.0000 mg | ORAL_TABLET | Freq: Two times a day (BID) | ORAL | 0 refills | Status: DC

## 2020-12-21 NOTE — Telephone Encounter (Signed)
Spoke with pt & she is on the way

## 2020-12-21 NOTE — Telephone Encounter (Signed)
POC UA,  FORMA UA AND CULTURE ORDERED.  PLEASE DO POC SINCE IT IS Friday AFTERNOON AND [facility] SUCKS

## 2020-12-21 NOTE — Telephone Encounter (Signed)
Notified patient that PCP would notify by My chart concerning POCT ua .

## 2020-12-21 NOTE — Telephone Encounter (Signed)
Pt states that Dr Derrel Nip was supposed to put in standing order for UA. She states that she has very cloudy, pink urine. She is also having pain when she urinates.

## 2020-12-21 NOTE — Telephone Encounter (Signed)
YES I DID TELL HER SHE COULD REQUEST A UA. I HVE ORDERED POC UA ETC  PLEASE SOMEBODY HANDLE

## 2020-12-23 LAB — URINALYSIS, ROUTINE W REFLEX MICROSCOPIC
Bilirubin Urine: NEGATIVE
Glucose, UA: NEGATIVE
Hyaline Cast: NONE SEEN /LPF
Ketones, ur: NEGATIVE
Nitrite: NEGATIVE
Specific Gravity, Urine: 1.009 (ref 1.001–1.03)
Squamous Epithelial / HPF: NONE SEEN /HPF (ref <=5)
WBC, UA: >=60 /HPF — AB (ref 0–5)
pH: 6 (ref 5.0–8.0)

## 2020-12-23 LAB — URINE CULTURE
MICRO NUMBER:: 11693477
SPECIMEN QUALITY:: ADEQUATE

## 2020-12-24 ENCOUNTER — Ambulatory Visit (INDEPENDENT_AMBULATORY_CARE_PROVIDER_SITE_OTHER)
Admission: RE | Admit: 2020-12-24 | Discharge: 2020-12-24 | Disposition: A | Discharge status: Home / Self Care | Payer: Medicare Other | Source: Ambulatory Visit | Attending: Internal Medicine | Admitting: Internal Medicine

## 2020-12-24 ENCOUNTER — Other Ambulatory Visit: Payer: Self-pay

## 2020-12-24 DIAGNOSIS — M810 Age-related osteoporosis without current pathological fracture: Secondary | ICD-10-CM | POA: Diagnosis not present

## 2021-02-11 ENCOUNTER — Telehealth: Payer: Self-pay

## 2021-02-11 NOTE — Telephone Encounter (Signed)
Prolia VOB initiated via [email or web address]  Last OV 07/18/20  Last Prolia inj 08/30/20 Next Prolia inj due 03/01/21

## 2021-02-13 NOTE — Telephone Encounter (Signed)
MEDICAL BENEFIT SUMMARY Patient Out-of-Pocket Responsibility Coverage Available Authorization Required Deductible Co-pay/Coinsurance Prolia  OOP COST PHYSICIAN FACILITY FEE ADMIN FEE PURCHASE OR REFERRAL: YES NO PRIMARY $233 Met SECONDARY No $0* $0* $0* *Reflects patient costs once plan deductible is met. Please see Medical Benefit Details for further information regarding patient costs. Patient costs may vary based on services rendered. BENEFITS VERIFIED FOR THE FOLLOWING DIAGNOSIS AND INSURANCE PLANS Verified for Diagnosis M81.0 M85.89 Site of Care  MD Office  Policy Status: Active  Policy Level: Primary Payer Name: [facility] Plan Name: [place] Number: [id number] Employer Name: Plan Type: Part A & B Group Number:  Payer Phone: [phone] Policy Status: Active   Policy Level: Secondary Payer Name: [employer] [place] Plan Name: [place] Number: [id number] Employer Name: [employer]ri [employer] 2011 Plan Type: Standard Supplement F Group Number: [id number]  Payer Phone: [phone] PRIMARY MEDICAL BENEFIT DETAILS (PHYSICIAN PURCHASE, [place]) COVERAGE AVAILABLE: Yes COVERAGE DETAILS: Benefits subject to a $233.00 deductible ($233.00 met) and 20% co-insurance for the administration and cost of Prolia. The benefits provided on this Verification of Benefits form are Medical Benefits and are the patient's In-Network benefits for Prolia. If you would like Pharmacy Benefits for Prolia, please call [phone]. AUTHORIZATION REQUIRED: No   SECONDARY MEDICAL BENEFIT DETAILS (PHYSICIAN PURCHASE, OR REFERRAL TO TREATING SITE) COVERAGE AVAILABLE: Yes COVERAGE DETAILS: This is a Medicare Supplement Plan F and it covers the Medicare Part B deductible, co-insurance and 100% of the excess charges. The benefits provided on this Verification of Benefits form are Medical Benefits and are the patient's In-Network benefits for  Prolia. If you would like Pharmacy Benefits for Prolia, please call [phone]. AUTHORIZATION REQUIRED: No

## 2021-02-13 NOTE — Telephone Encounter (Signed)
Pt ready for scheduling on or after 03/01/21  Out-of-pocket cost due at time of visit: $0.00  Primary: Medicare Prolia co-insurance: 20% ($255) Admin fee co-insurance:   Secondary:  Prolia co-insurance: covers the Medicare Part B deductible, co-insurance and 100% of the excess charges Admin fee co-insurance: covers the Medicare Part B deductible, co-insurance and 100% of the excess charges  Deductible: $233 of $233 met  Prior Auth: n/a PA# Valid:

## 2021-02-22 DIAGNOSIS — N3946 Mixed incontinence: Secondary | ICD-10-CM | POA: Diagnosis not present

## 2021-02-22 DIAGNOSIS — N952 Postmenopausal atrophic vaginitis: Secondary | ICD-10-CM | POA: Diagnosis not present

## 2021-02-22 DIAGNOSIS — Z87440 Personal history of urinary (tract) infections: Secondary | ICD-10-CM | POA: Diagnosis not present

## 2021-02-22 DIAGNOSIS — K5900 Constipation, unspecified: Secondary | ICD-10-CM | POA: Diagnosis not present

## 2021-02-22 DIAGNOSIS — K5901 Slow transit constipation: Secondary | ICD-10-CM | POA: Diagnosis not present

## 2021-02-22 DIAGNOSIS — N398 Other specified disorders of urinary system: Secondary | ICD-10-CM | POA: Diagnosis not present

## 2021-02-26 ENCOUNTER — Telehealth: Payer: Self-pay | Admitting: Internal Medicine

## 2021-02-26 NOTE — Telephone Encounter (Signed)
New message    Patient checking on the status of Prolia asking for a call back to discuss

## 2021-02-27 DIAGNOSIS — R3129 Other microscopic hematuria: Secondary | ICD-10-CM | POA: Diagnosis not present

## 2021-02-27 DIAGNOSIS — I10 Essential (primary) hypertension: Secondary | ICD-10-CM | POA: Diagnosis not present

## 2021-02-28 NOTE — Telephone Encounter (Signed)
Please call pt and schedule Prolia injection. See previous encounter with details.

## 2021-03-04 NOTE — Telephone Encounter (Signed)
prolia scheduled for 03/08/21

## 2021-03-05 DIAGNOSIS — N281 Cyst of kidney, acquired: Secondary | ICD-10-CM | POA: Diagnosis not present

## 2021-03-05 DIAGNOSIS — I773 Arterial fibromuscular dysplasia: Secondary | ICD-10-CM | POA: Diagnosis not present

## 2021-03-05 DIAGNOSIS — R3129 Other microscopic hematuria: Secondary | ICD-10-CM | POA: Diagnosis not present

## 2021-03-05 DIAGNOSIS — I10 Essential (primary) hypertension: Secondary | ICD-10-CM | POA: Diagnosis not present

## 2021-03-05 DIAGNOSIS — Z681 Body mass index (BMI) 19 or less, adult: Secondary | ICD-10-CM | POA: Diagnosis not present

## 2021-03-08 ENCOUNTER — Other Ambulatory Visit: Payer: Self-pay

## 2021-03-08 ENCOUNTER — Ambulatory Visit (INDEPENDENT_AMBULATORY_CARE_PROVIDER_SITE_OTHER): Payer: Medicare Other

## 2021-03-08 DIAGNOSIS — M810 Age-related osteoporosis without current pathological fracture: Secondary | ICD-10-CM

## 2021-03-08 MED ORDER — DENOSUMAB 60 MG/ML ~~LOC~~ SOSY
60.0000 mg | PREFILLED_SYRINGE | Freq: Once | SUBCUTANEOUS | Status: AC
  Administered 2021-03-08: 60 mg via SUBCUTANEOUS

## 2021-03-08 NOTE — Progress Notes (Signed)
Prolia injection administered to pt's left arm. Pt tolerated well. °

## 2021-03-10 NOTE — Telephone Encounter (Signed)
Pt received Prolia inj 03/08/21  Next inj due 09/08/21

## 2021-03-18 DIAGNOSIS — Z1231 Encounter for screening mammogram for malignant neoplasm of breast: Secondary | ICD-10-CM | POA: Diagnosis not present

## 2021-03-18 LAB — HM MAMMOGRAPHY

## 2021-03-20 DIAGNOSIS — K5900 Constipation, unspecified: Secondary | ICD-10-CM | POA: Diagnosis not present

## 2021-03-25 NOTE — Telephone Encounter (Signed)
Pt has been scheduled form a VV with Al[facility] for 03/25/21

## 2021-03-26 ENCOUNTER — Encounter: Payer: Self-pay | Admitting: Family Medicine

## 2021-03-26 ENCOUNTER — Telehealth (INDEPENDENT_AMBULATORY_CARE_PROVIDER_SITE_OTHER): Payer: Medicare Other | Admitting: Family Medicine

## 2021-03-26 DIAGNOSIS — I701 Atherosclerosis of renal artery: Secondary | ICD-10-CM

## 2021-03-26 DIAGNOSIS — I150 Renovascular hypertension: Secondary | ICD-10-CM

## 2021-03-26 DIAGNOSIS — L259 Unspecified contact dermatitis, unspecified cause: Secondary | ICD-10-CM

## 2021-03-26 MED ORDER — PREDNISONE 10 MG PO TABS
ORAL_TABLET | ORAL | 0 refills | Status: DC
Start: 2021-03-26 — End: 2021-04-04

## 2021-03-26 NOTE — Progress Notes (Deleted)
Virtual Visit via Video Note  I connected with Hailey Hunt on 03/26/21 at 10:30 AM EDT by a video enabled telemedicine application and verified that I am speaking with the correct person using two identifiers.  Location: Patient: *** Provider: ***   I discussed the limitations of evaluation and management by telemedicine and the availability of in person appointments. The patient expressed understanding and agreed to proceed.  Parties involved in encounter  Patient:  Provider:  [name] MD   History of Present Illness: 75 yo pt of Dr Derrel Nip presents for rash  She has a h/o paget's dz , OP and IgA nephropathy  Phone note from yesterday: 2 weeks ago, I developed a red, hard bump on my thigh (similar to the one you treated [facility] for).  I thought it might be a bug bite but the area around kept kept getting bigger and the itching was horrible.  Then, a couple nights later, both of my arm pits started itching but I did not see anything.  The next morning, both arm pits had many small red, hard bumps and the itching was intense.  After that, bumps developed on my arms, wrist, back, chest, face and finally in my genital area.  Once more, intense itching that did not respond to triamcinolone cream or Benadryl cream.  Sleeping was next to impossible  Nothing in mouth or scalp   Really hot water in the shower help symptoms a little   She does not wear deoderant   One bump on face -almost looks like a pimple She uses retin A for skin condition   None of her medicines are new  Most recent is vaginal estrogen  Does not look like hives   One area on wrist turned into a blister (amber colored d/c)   No h/o yeast No h/o scabies    No tight chest or swelling in mouth   No fever  Feels like her usual self  She looked up monkey pox   No recent vaccines or exposures  No h/o food allergies   Had a prolia shot in June      Observations/Objective:   Assessment and  Plan:   Follow Up Instructions:    I discussed the assessment and treatment plan with the patient. The patient was provided an opportunity to ask questions and all were answered. The patient agreed with the plan and demonstrated an understanding of the instructions.   The patient was advised to call back or seek an in-person evaluation if the symptoms worsen or if the condition fails to improve as anticipated.  I provided *** minutes of non-face-to-face time during this encounter.   [name], MD

## 2021-03-26 NOTE — Assessment & Plan Note (Signed)
Resembles contact dermatitis (possibly poison ivy/oak)  Prednisone 40 mg taper px  Disc side effects  Recommend zyrtec 10 mg daily  Update if not starting to improve in a week or if worsening

## 2021-03-26 NOTE — Patient Instructions (Signed)
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 Poison ivy or oak or other

## 2021-03-26 NOTE — Progress Notes (Signed)
Subjective:    Patient ID: Hailey Hunt, female    DOB: [birth year], 75 y.o.   MRN: [id number]  This visit occurred during the SARS-CoV-2 public health emergency.  Safety protocols were in place, including screening questions prior to the visit, additional usage of staff PPE, and extensive cleaning of exam room while observing appropriate contact time as indicated for disinfecting solutions.   HPI 75 yo pt of Dr Derrel Nip presents for rash  She has a h/o paget's dz , OP and IgA nephropathy  Phone note from yesterday: 2 weeks ago, I developed a red, hard bump on my thigh (similar to the one you treated [facility] for).  I thought it might be a bug bite but the area around kept kept getting bigger and the itching was horrible.  Then, a couple nights later, both of my arm pits started itching but I did not see anything.  The next morning, both arm pits had many small red, hard bumps and the itching was intense.  After that, bumps developed on my arms, wrist, back, chest, face and finally in my genital area.  Once more, intense itching that did not respond to triamcinolone cream or Benadryl cream.  Sleeping was next to impossible  Nothing in mouth or scalp   Really hot water in the shower help symptoms a little   She does not wear deoderant   One bump on face -almost looks like a pimple She uses retin A for skin condition   None of her medicines are new  Most recent is vaginal estrogen  Does not look like hives   One area on wrist turned into a blister (amber colored d/c)   No h/o yeast No h/o scabies    No tight chest or swelling in mouth   No fever  Feels like her usual self  She looked up monkey pox   No recent vaccines or exposures  No h/o food allergies   Had a prolia shot in June   Patient Active Problem List   Diagnosis Date Noted   Contact dermatitis 03/26/2021   Pseudophakia of right eye 11/29/2020   UTI (urinary tract infection) 11/15/2020   Mild protein-calorie  malnutrition ([place]) 05/15/2019   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[place]) 05/15/2019   Renal cysts, acquired, bilateral 05/15/2019   Microscopic hematuria 11/13/2018   IgA nephropathy 06/30/2018   Prediabetes 09/21/2016   Medicare annual wellness visit, subsequent 12/19/2015   PSVT (paroxysmal supraventricular tachycardia) ([place]) 12/19/2015   Osteoporosis 12/13/2015   Solitary pulmonary nodule 11/19/2015   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 11/19/2015   History of pneumonia 08/29/2013   Family history of tobacco abuse 08/29/2013   History of nephrolithiasis 01/10/2013   Allergic rhinitis 01/10/2013   Urinary incontinence, mixed    Hypothyroidism following radioiodine therapy 07/14/2012   Vitamin D deficiency 01/21/2012   Pernicious anemia 01/14/2012   History of transfusion of packed red blood cells 01/14/2012   Fibromuscular dysplasia of renal artery (HCC)    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HCC)    Dr Mervyn Skeeters   Paget's disease of the bone    Pernicious anemia    Thyroid disease    Urinary incontinence, mixed    resolved with removal of ained sutures in bladder    Past Surgical History:  Procedure Laterality Date   BILATERAL SALPINGOOPHORECTOMY     CATARACT EXTRACTION  Sep 10, 2011, Jan 13,2013   COLONOSCOPY     RENAL BIOPSY     TONSILLECTOMY     VAGINAL DELIVERY     x2   VAGINAL HYSTERECTOMY      WISDOM TOOTH EXTRACTION     Social History   Tobacco Use   Smoking status: Never   Smokeless tobacco: Never  Vaping Use   Vaping Use: Never used  Substance Use Topics   Alcohol use: Yes    Comment: once a year   Drug use: No   Family History  Problem Relation Age of Onset   Anuerysm Mother    COPD Mother    Hypertension Mother    Heart disease Mother    Heart failure Mother    Cancer Father        lung CA   Heart disease Father    Heart attack Father    Heart disease Brother        viral cardiomyopathy, cocaine use   Drug abuse Brother    Cancer Maternal Aunt        esophageal and live rCA,  hisotory of  ETOH and tobacco   Anuerysm Maternal Grandfather    Cancer Paternal Grandmother        brain tumor   Diabetes Sister    Colon cancer Paternal Uncle    Allergies  Allergen Reactions   Voltaren [Diclofenac] Hypertension   Adhesive [Tape]    Demerol     HYPOTENSION   Macrodantin    Nsaids Hypertension   Voltaren [Diclofenac Sodium] Other (See Comments)    FMD, increased bp   Codeine Nausea And Vomiting and Rash   Nickel Rash   Penicillins Rash   Sulfa Antibiotics Rash    Fever   Current Outpatient Medications on File Prior to Visit  Medication Sig Dispense Refill   ASPIRIN 81 PO      cholecalciferol (VITAMIN D) 1000 UNITS tablet Take 1,000 Units by mouth daily.     cyanocobalamin (,VITAMIN B-12,) 1000 MCG/ML injection INJECT AS DIRECTED 1 mL 11   Cyanocobalamin (VITAMIN B 12 PO) Take by mouth.     denosumab (PROLIA) 60 MG/ML SOSY injection      omeprazole (PRILOSEC) 40 MG capsule Take 1 capsule (40 mg total) by mouth daily. 30 capsule 3   prednisoLONE acetate (PRED FORTE) 1 % ophthalmic suspension SMARTSIG:In Eye(s)     SYNTHROID 50 MCG tablet TAKE ONE TABLET BY MOUTH DAILY ON AN EMPTY STOMACH WITH A FULL GLASS OF WATER ATLEAST 30 TO 60 MINUTES BEFORE BREAKFAST 90 tablet 1   Syringe/Needle, Disp, (SYRINGE 3CC/27GX1-1/4") 27G X 1-1/4" 3 ML MISC For B12  injections 100 each 0   traMADol (ULTRAM) 50 MG tablet Take 1 tablet (50 mg total) by mouth every 8 (eight) hours as needed. 30 tablet 3   tretinoin (RETIN-A) 0.025 % cream APPLY TOPICALLY AT BEDTIME 45 g  1   Varenicline Tartrate (TYRVAYA) 0.03 MG/ACT SOLN      No current facility-administered medications on file prior to visit.    Review of Systems  Constitutional:  Negative for activity change, appetite change, fatigue, fever and unexpected weight change.  HENT:  Negative for congestion, ear pain, rhinorrhea, sinus pressure and sore throat.   Eyes:  Negative for pain, redness and visual disturbance.  Respiratory:  Negative for cough, shortness of breath and wheezing.   Cardiovascular:  Negative for chest pain and palpitations.  Gastrointestinal:  Negative for abdominal pain, blood in stool, constipation and diarrhea.  Endocrine: Negative for polydipsia and polyuria.  Genitourinary:  Negative for dysuria, frequency and urgency.  Musculoskeletal:  Negative for arthralgias, back pain and myalgias.  Skin:  Positive for rash. Negative for pallor.  Allergic/Immunologic: Negative for environmental allergies.  Neurological:  Negative for dizziness, syncope and headaches.  Hematological:  Negative for adenopathy. Does not bruise/bleed easily.  Psychiatric/Behavioral:  Negative for decreased concentration and dysphoric mood. The patient is not nervous/anxious.       Objective:   Physical Exam Constitutional:      Appearance: Normal appearance. She is normal weight. She is not ill-appearing.  HENT:     Head: Normocephalic and atraumatic.     Comments: No facial swelling     Mouth/Throat:     Comments: No mouth or throat swelling  Eyes:     General:        Right eye: No discharge.        Left eye: No discharge.     Conjunctiva/sclera: Conjunctivae normal.     Pupils: Pupils are equal, round, and reactive to light.  Neck:     Vascular: No carotid bruit.  Cardiovascular:     Rate and  Rhythm: Normal rate and regular rhythm.     Heart sounds: Normal heart sounds.  Pulmonary:     Effort: Pulmonary effort is normal. No respiratory distress.     Breath sounds: Normal breath sounds. No wheezing.  Musculoskeletal:     Cervical back: Normal range of motion and neck supple. No tenderness.  Lymphadenopathy:     Cervical: No cervical adenopathy.  Skin:    General: Skin is warm and dry.     Coloration: Skin is not pale.     Findings: Rash present. No bruising.     Comments: Areas of papules and dried vesicles in patches on L upper arm, R axilla, mid chest and one papule on L cheek One dry papule on L thigh Some in linear patters resembling plant dermatitis  No excoriation  Slightly pink in color  Neurological:     Mental Status: She is alert.     Sensory: No sensory deficit.  Psychiatric:        Mood and Affect: Mood normal.          Assessment & Plan:   Problem List Items Addressed This Visit       Musculoskeletal and Integument   Contact dermatitis    Resembles contact dermatitis (possibly poison ivy/oak)  Prednisone 40 mg taper px  Disc side effects  Recommend zyrtec 10 mg daily  Update if not starting to improve in a week or if worsening

## 2021-04-03 NOTE — Telephone Encounter (Signed)
PT called to state that she saw Dr.Tower and she stated for her to be seen by Dr.Tullo asap. There is nothing available within the week timeframe the PT wanted and she would like to see what Dr.Tullo could do. PT is still having the same symptoms and thinks they might need to be biopsy.

## 2021-04-04 ENCOUNTER — Other Ambulatory Visit: Payer: Self-pay

## 2021-04-04 ENCOUNTER — Ambulatory Visit (INDEPENDENT_AMBULATORY_CARE_PROVIDER_SITE_OTHER): Payer: Medicare Other | Admitting: Internal Medicine

## 2021-04-04 ENCOUNTER — Encounter: Payer: Self-pay | Admitting: Internal Medicine

## 2021-04-04 VITALS — BP 132/68 | HR 83 | Temp 97.0°F | Resp 14 | Ht 63.0 in | Wt 95.6 lb

## 2021-04-04 DIAGNOSIS — L509 Urticaria, unspecified: Secondary | ICD-10-CM

## 2021-04-04 DIAGNOSIS — I150 Renovascular hypertension: Secondary | ICD-10-CM

## 2021-04-04 DIAGNOSIS — I701 Atherosclerosis of renal artery: Secondary | ICD-10-CM | POA: Diagnosis not present

## 2021-04-04 MED ORDER — PREDNISONE 10 MG PO TABS: ORAL_TABLET | ORAL | 0 refills | Status: DC

## 2021-04-04 MED ORDER — MONTELUKAST SODIUM 10 MG PO TABS: 10.0000 mg | ORAL_TABLET | Freq: Every day | ORAL | 3 refills | Status: DC

## 2021-04-04 NOTE — Assessment & Plan Note (Signed)
Appear to have been triggered by an insect bite on her left medial thigh. They have nearly resolved on a regimen of prednisone, famotidine, and benadryl ,  But she has developed several new papules over the last 48 hours as the prednisone dose has dropped to 10 mg daily.  Checking ESR CRP and CBC . advised to continue histamine blockage with benadryl and famotidine,  Adding Singulair 10 mg daily  And if she continues to develop new areas of acitivity she will restart a prednisone taper

## 2021-04-04 NOTE — Progress Notes (Signed)
 Subjective:  Patient ID: Hailey Hunt, female    DOB: 07/18/1946  Age: 74 y.o. MRN: [id number]  CC: The primary encounter diagnosis was Hives. A diagnosis of Hives of unknown origin was also pertinent to this visit.  HPI Hailey Hunt presents for follow up on pruritic rash/hives  This visit occurred during the SARS-CoV-2 public health emergency.  Safety protocols were in place, including screening questions prior to the visit, additional usage of staff PPE, and extensive cleaning of exam room while observing appropriate contact time as indicated for disinfecting solutions.   Treated by outside MD on 6/28 for presumed contact dermatitis symptoms started on 6.26 with a hard red bump on thigh followed by sppread of itchy papulrs all over.  Took prednisone taper starting at 40 mg daily starting on June 28   Histroy of hives outbreak a decade ago attributed to stress . She has two daughters with significant chronic health issues; both are my patients and both are having problems currently that have not been resolved.   Outpatient Medications Prior to Visit  Medication Sig Dispense Refill   ASPIRIN 81 PO      cholecalciferol (VITAMIN D) 1000 UNITS tablet Take 1,000 Units by mouth daily.     cyanocobalamin (,VITAMIN B-12,) 1000 MCG/ML injection INJECT AS DIRECTED 1 mL 11   Cyanocobalamin (VITAMIN B 12 PO) Take by mouth.     denosumab (PROLIA) 60 MG/ML SOSY injection      prednisoLONE acetate (PRED FORTE) 1 % ophthalmic suspension SMARTSIG:In Eye(s)     SYNTHROID 50 MCG tablet TAKE ONE TABLET BY MOUTH DAILY ON AN EMPTY STOMACH WITH A FULL GLASS OF WATER ATLEAST 30 TO 60 MINUTES BEFORE BREAKFAST 90 tablet 1   Syringe/Needle, Disp, (SYRINGE 3CC/27GX1-1/4") 27G X 1-1/4" 3 ML MISC For B12 injections 100 each 0   traMADol (ULTRAM) 50 MG tablet Take 1 tablet (50 mg total) by mouth every 8 (eight) hours as needed. 30 tablet 3   tretinoin (RETIN-A) 0.025 % cream APPLY TOPICALLY AT BEDTIME 45 g 1    Varenicline Tartrate (TYRVAYA) 0.03 MG/ACT SOLN      predniSONE (DELTASONE) 10 MG tablet Take 4 pills once daily by mouth for 3 days, then 3 pills daily for 3 days, then 2 pills daily for 3 days then 1 pill daily for 3 days then stop 30 tablet 0   omeprazole (PRILOSEC) 40 MG capsule Take 1 capsule (40 mg total) by mouth daily. (Patient not taking: Reported on 04/04/2021) 30 capsule 3   No facility-administered medications prior to visit.    Review of Systems;  Patient denies headache, fevers, malaise, unintentional weight loss, skin rash, eye pain, sinus congestion and sinus pain, sore throat, dysphagia,  hemoptysis , cough, dyspnea, wheezing, chest pain, palpitations, orthopnea, edema, abdominal pain, nausea, melena, diarrhea, constipation, flank pain, dysuria, hematuria, urinary  Frequency, nocturia, numbness, tingling, seizures,  Focal weakness, Loss of consciousness,  Tremor, insomnia, depression, anxiety, and suicidal ideation.      Objective:  BP 132/68 (BP Location: Left Arm, Patient Position: Sitting, Cuff Size: Normal)   Pulse 83   Temp (!) 97 F (36.1 C) (Temporal)   Resp 14   Ht 5' 3" (1.6 m)   Wt 95 lb 9.6 oz (43.4 kg)   SpO2 97%   BMI 16.93 kg/m   BP Readings from Last 3 Encounters:  04/04/21 132/68  03/26/21 (!) 141/76  11/28/20 112/66    Wt Readings from Last 3 Encounters:  04/04/21   95 lb 9.6 oz (43.4 kg)  11/28/20 97 lb (44 kg)  11/15/20 94 lb 9.6 oz (42.9 kg)    General appearance: alert, cooperative and appears stated age Ears: normal TM's and external ear canals both ears Throat: lips, mucosa, and tongue normal; teeth and gums normal Neck: no adenopathy, no carotid bruit, supple, symmetrical, trachea midline and thyroid not enlarged, symmetric, no tenderness/mass/nodules Back: symmetric, no curvature. ROM normal. No CVA tenderness. Lungs: clear to auscultation bilaterally Heart: regular rate and rhythm, S1, S2 normal, no murmur, click, rub or  gallop Abdomen: soft, non-tender; bowel sounds normal; no masses,  no organomegaly Pulses: 2+ and symmetric Skin: several resolving hives with fading erythema on arms.  Several small papules on abdomen and chest,  Lymph nodes: Cervical, supraclavicular, and axillary nodes normal.  Lab Results  Component Value Date   HGBA1C 5.8 11/28/2020   HGBA1C 6.0 05/25/2020   HGBA1C 5.9 05/23/2019    Lab Results  Component Value Date   CREATININE 0.89 11/28/2020   CREATININE 0.98 05/25/2020   CREATININE 1.05 (H) 07/01/2019    Lab Results  Component Value Date   WBC 5.8 06/16/2018   HGB 11.6 (L) 06/16/2018   HCT 34.7 (L) 06/16/2018   PLT 269.0 06/16/2018   GLUCOSE 89 11/28/2020   CHOL 198 11/28/2020   TRIG 62.0 11/28/2020   HDL 87.10 11/28/2020   LDLDIRECT 92.0 11/19/2015   LDLCALC 98 11/28/2020   ALT 16 11/28/2020   AST 22 11/28/2020   NA 141 11/28/2020   K 4.1 11/28/2020   CL 105 11/28/2020   CREATININE 0.89 11/28/2020   BUN 25 (H) 11/28/2020   CO2 29 11/28/2020   TSH 1.37 11/28/2020   HGBA1C 5.8 11/28/2020   MICROALBUR 1.8 12/23/2017    DG Bone Density  Result Date: 12/30/2020 Date of study: 12/24/20 Exam: DUAL X-RAY ABSORPTIOMETRY (DXA) FOR BONE MINERAL DENSITY (BMD) Instrument: [employer] Requesting Provider: PCP Indication: follow up for low BMD Comparison: none (please note that it is not possible to compare data from different instruments) Clinical data: Pt is a 74 y.o. female without previous history of fracture. On calcium and vitamin D, and Prolia Results:  Lumbar spine L1-L4 Femoral neck (FN) 33% distal radius T-score -0.1 RFN: -2.1 LFN: -2.1 n/a Change in BMD from previous DXA test (%) Up 4.2% Up 4.0% n/a (*) statistically significant Assessment: the BMD is low according to the WHO classification for osteoporosis (see below). Fracture risk: moderate FRAX score: not calculated due to treatment with Prolia Comments: the technical quality of the study is good.  Evaluation for secondary causes should be considered if clinically indicated. Recommend optimizing calcium (1200 mg/day) and vitamin D (800 IU/day) intake. Followup: Repeat BMD is appropriate after 2 years or after 1-2 years if starting treatment. WHO criteria for diagnosis of osteoporosis in postmenopausal women and in men 50 y/o or older: - normal: T-score -1.0 to + 1.0 - osteopenia/low bone density: T-score between -2.5 and -1.0 - osteoporosis: T-score below -2.5 - severe osteoporosis: T-score below -2.5 with history of fragility fracture Note: although not part of the WHO classification, the presence of a fragility fracture, regardless of the T-score, should be considered diagnostic of osteoporosis, provided other causes for the fracture have been excluded. Treatment: The National Osteoporosis Foundation recommends that treatment be considered in postmenopausal women and men age 50 or older with: 1. Hip or vertebral (clinical or morphometric) fracture 2. T-score of - 2.5 or lower at the spine or hip   3. 10-year fracture probability by FRAX of at least 20% for a major osteoporotic fracture and 3% for a hip fracture Marne Tower MD    Assessment & Plan:   Problem List Items Addressed This Visit       Unprioritized   Hives of unknown origin    Appear to have been triggered by an insect bite on her left medial thigh. They have nearly resolved on a regimen of prednisone, famotidine, and benadryl ,  But she has developed several new papules over the last 48 hours as the prednisone dose has dropped to 10 mg daily.  Checking ESR CRP and CBC . advised to continue histamine blockage with benadryl and famotidine,  Adding Singulair 10 mg daily  And if she continues to develop new areas of acitivity she will restart a prednisone taper        Other Visit Diagnoses     Hives    -  Primary   Relevant Orders   Sedimentation rate   C-reactive protein   CBC with Differential/Platelet       I am having  Hailey B. Lanphier "[name]" start on montelukast. I am also having her maintain her SYRINGE 3CC/27GX1-1/4", cholecalciferol, Cyanocobalamin (VITAMIN B 12 PO), ASPIRIN 81 PO, traMADol, denosumab, omeprazole, tretinoin, cyanocobalamin, Tyrvaya, prednisoLONE acetate, Synthroid, and predniSONE.  Meds ordered this encounter  Medications   montelukast (SINGULAIR) 10 MG tablet    Sig: Take 1 tablet (10 mg total) by mouth at bedtime.    Dispense:  30 tablet    Refill:  3   predniSONE (DELTASONE) 10 MG tablet    Sig: Take 4 pills once daily by mouth for 3 days, then 3 pills daily for 3 days, then 2 pills daily for 3 days then 1 pill daily for 3 days then stop    Dispense:  30 tablet    Refill:  0  A total of 40 minutes of face to face time was spent with patient more than half of which was spent in reviewing her treatment for hives/dermatitis , counselling and coordination of care    Medications Discontinued During This Encounter  Medication Reason   predniSONE (DELTASONE) 10 MG tablet Reorder    Follow-up: No follow-ups on file.   Teresa L Tullo, MD  

## 2021-04-04 NOTE — Patient Instructions (Addendum)
Continue famotidine 20 mg twice daily  Benadryl every 8 hours 25 mg   Adding singulair 10 mg daily   Resume prednisone taper if more papules pop up

## 2021-04-05 LAB — CBC WITH DIFFERENTIAL/PLATELET
Basophils Absolute: 0 10*3/uL (ref 0.0–0.1)
Basophils Relative: 0.3 % (ref 0.0–3.0)
Eosinophils Absolute: 0 10*3/uL (ref 0.0–0.7)
Eosinophils Relative: 0.1 % (ref 0.0–5.0)
HCT: 36.9 % (ref 36.0–46.0)
Hemoglobin: 12.1 g/dL (ref 12.0–15.0)
Lymphocytes Relative: 6.9 % — ABNORMAL LOW (ref 12.0–46.0)
Lymphs Abs: 0.9 10*3/uL (ref 0.7–4.0)
MCHC: 32.6 g/dL (ref 30.0–36.0)
MCV: 90.7 fl (ref 78.0–100.0)
Monocytes Absolute: 0.8 10*3/uL (ref 0.1–1.0)
Monocytes Relative: 6.1 % (ref 3.0–12.0)
Neutro Abs: 11.3 10*3/uL — ABNORMAL HIGH (ref 1.4–7.7)
Neutrophils Relative %: 86.6 % — ABNORMAL HIGH (ref 43.0–77.0)
Platelets: 310 10*3/uL (ref 150.0–400.0)
RBC: 4.07 Mil/uL (ref 3.87–5.11)
RDW: 15.4 % (ref 11.5–15.5)
WBC: 13 10*3/uL — ABNORMAL HIGH (ref 4.0–10.5)

## 2021-04-05 LAB — SEDIMENTATION RATE: Sed Rate: 1 mm/hr (ref 0–30)

## 2021-04-05 LAB — C-REACTIVE PROTEIN: CRP: <1 mg/dL (ref 0.5–20.0)

## 2021-04-12 NOTE — Progress Notes (Signed)
[facility] [place] Phone: [phone] Subjective:   Rito Ehrlich, am serving as a scribe for Dr. Hulan Saas.  I'm seeing this patient by the request  of:  Crecencio Mc, MD  CC: Bilateral hand pain  [id number]  Hailey Hunt is a 75 y.o. female coming in with complaint of B wrist pain. Patient states that wrists have been in pain for years and progressively getting worse states that it is like massive charlie horses in her hands, patient also get that in her feet. L>R patient states that it is very intermittent.  Has happened while she has been driving.  Can happen at night.  Patient denies though any weakness or numbness that she notices regularly.  States that she can still do a lot of activity.  He states that the pain in her hands seem to be worsening and does do a lot of cooking and this is making it somewhat difficult.   Past medical history significant for osteoporosis, fibromuscular dysplasia and hypothyroidism.  Past Medical History:  Diagnosis Date   Aberrant right subclavian artery    Cataract    surgery   Clotting disorder (HCC)    hx of bleeding after vaginal delivery, dr told her not to have any surgeries that are necessary   Dysplasia, artery, fibromuscular, renal ([place])    Fibromuscular dysplasia ([place])    Fibromuscular dysplasia of renal artery ([place])    managed by Dr. 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HCC)    Dr Mervyn Skeeters   Paget's disease of the bone    Pernicious anemia    Thyroid disease    Urinary incontinence, mixed    resolved with removal of ained sutures in bladder    Past Surgical History:  Procedure Laterality Date   BILATERAL SALPINGOOPHORECTOMY     CATARACT EXTRACTION  Sep 10, 2011, Jan 13,2013   COLONOSCOPY     RENAL BIOPSY     TONSILLECTOMY      VAGINAL DELIVERY     x2   VAGINAL HYSTERECTOMY     WISDOM TOOTH EXTRACTION     Social History   Socioeconomic History   Marital status: Married    Spouse name: Not on file   Number of children: 2   Years of education: Not on file   Highest education level: Not on file  Occupational History   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  Tobacco Use   Smoking status: Never   Smokeless tobacco: Never  Vaping Use   Vaping Use: Never used  Substance and Sexual Activity   Alcohol use: Yes    Comment: once a year   Drug use: No   Sexual activity: Not Currently  Other Topics Concern   Not on file  Social History Narrative   Not on file   Social Determinants of Health   Financial Resource Strain: Low Risk    Difficulty of Paying Living Expenses: Not hard at all  Food Insecurity: No Food Insecurity   Worried About Charity fundraiser in the Last Year: Never true   [place] in the Last Year: Never true  Transportation Needs: No Transportation Needs   Lack of Transportation (Medical): No   Lack of Transportation (Non-Medical): No  Physical  Activity: Not on file  Stress: No Stress Concern Present   Feeling of Stress : Not at all  Social Connections: Unknown   Frequency of Communication with Friends and Family: Not on file   Frequency of Social Gatherings with Friends and Family: Not on file   Attends Religious Services: Not on file   Active Member of Clubs or Organizations: Not on file   Attends Archivist Meetings: Not on file   Marital Status: Married   Allergies  Allergen Reactions   Voltaren [Diclofenac] Hypertension   Adhesive [Tape]    Demerol     HYPOTENSION   Macrodantin    Nsaids Hypertension   Voltaren [Diclofenac Sodium] Other (See Comments)    FMD, increased bp   Codeine Nausea And Vomiting and Rash   Nickel Rash   Penicillins Rash   Sulfa Antibiotics Rash    Fever   Family History  Problem Relation Age of Onset   Anuerysm Mother    COPD  Mother    Hypertension Mother    Heart disease Mother    Heart failure Mother    Cancer Father        lung CA   Heart disease Father    Heart attack Father    Heart disease Brother        viral cardiomyopathy, cocaine use   Drug abuse Brother    Cancer Maternal Aunt        esophageal and live rCA,  hisotory of  ETOH and tobacco   Anuerysm Maternal Grandfather    Cancer Paternal Grandmother        brain tumor   Diabetes Sister    Colon cancer Paternal Uncle     Current Outpatient Medications (Endocrine & Metabolic):    denosumab (PROLIA) 60 MG/ML SOSY injection,    SYNTHROID 50 MCG tablet, TAKE ONE TABLET BY MOUTH DAILY ON AN EMPTY STOMACH WITH A FULL GLASS OF WATER ATLEAST 30 TO 60 MINUTES BEFORE BREAKFAST   predniSONE (DELTASONE) 10 MG tablet, Take 4 pills once daily by mouth for 3 days, then 3 pills daily for 3 days, then 2 pills daily for 3 days then 1 pill daily for 3 days then stop (Patient not taking: Reported on 04/19/2021)   Current Outpatient Medications (Respiratory):    montelukast (SINGULAIR) 10 MG tablet, Take 1 tablet (10 mg total) by mouth at bedtime.  Current Outpatient Medications (Analgesics):    ASPIRIN 81 PO,    traMADol (ULTRAM) 50 MG tablet, Take 1 tablet (50 mg total) by mouth every 8 (eight) hours as needed.  Current Outpatient Medications (Hematological):    cyanocobalamin (,VITAMIN B-12,) 1000 MCG/ML injection, INJECT AS DIRECTED   Cyanocobalamin (VITAMIN B 12 PO), Take by mouth.  Current Outpatient Medications (Other):    cholecalciferol (VITAMIN D) 1000 UNITS tablet, Take 1,000 Units by mouth daily.   gabapentin (NEURONTIN) 100 MG capsule, Take two tablets daily at bedtime (total 200MG )   Syringe/Needle, Disp, (SYRINGE 3CC/27GX1-1/4") 27G X 1-1/4" 3 ML MISC, For B12 injections   tretinoin (RETIN-A) 0.025 % cream, APPLY TOPICALLY AT BEDTIME   Varenicline Tartrate (TYRVAYA) 0.03 MG/ACT SOLN,    omeprazole (PRILOSEC) 40 MG capsule, Take 1 capsule (40  mg total) by mouth daily. (Patient not taking: No sig reported)   prednisoLONE acetate (PRED FORTE) 1 % ophthalmic suspension, SMARTSIG:In Eye(s) (Patient not taking: Reported on 04/19/2021)   Reviewed prior external information including notes and imaging from  primary care provider As well as  notes that were available from care everywhere and other healthcare systems.  Past medical history, social, surgical and family history all reviewed in electronic medical record.  No pertanent information unless stated regarding to the chief complaint.   Review of Systems:  No headache, visual changes, nausea, vomiting, diarrhea, constipation, dizziness, abdominal pain, skin rash, fevers, chills, night sweats, weight loss, swollen lymph nodes, body aches, joint swelling, chest pain, shortness of breath, mood changes. POSITIVE muscle aches  Objective  Blood pressure 110/70, pulse 84, height 5\' 3"  (1.6 m), weight 96 lb (43.5 kg), SpO2 97 %.   General: No apparent distress alert and oriented x3 mood and affect normal, dressed appropriately.  HEENT: Pupils equal, extraocular movements intact  Respiratory: Patient's speak in full sentences and does not appear short of breath  Cardiovascular: No lower extremity edema, non tender, no erythema  Gait normal with good balance and coordination.  MSK: Patient is minorly underweight. Bilateral hand exam shows mild atrophy noted of the thenar and hypothenar eminence bilaterally.  Patient has full grip strength noted.  Patient is nontender with Tinel's.  Good capillary refill it appears of the fingers bilaterally.  Warm to touch in the hands at the moment.  Limited musculoskeletal ultrasound was performed and interpreted by Lyndal Pulley  Limited ultrasound of patient's hands bilaterally show that tendons are unremarkable.  Patient's median nerves is within normal diameter.  No hypoechoic changes noted. Impression: Unremarkable ultrasound of the wrist  bilaterally   Impression and Recommendations:    The above documentation has been reviewed and is accurate and complete Lyndal Pulley, DO

## 2021-04-17 DIAGNOSIS — K5900 Constipation, unspecified: Secondary | ICD-10-CM | POA: Diagnosis not present

## 2021-04-18 DIAGNOSIS — H40013 Open angle with borderline findings, low risk, bilateral: Secondary | ICD-10-CM | POA: Diagnosis not present

## 2021-04-18 DIAGNOSIS — H268 Other specified cataract: Secondary | ICD-10-CM | POA: Diagnosis not present

## 2021-04-18 NOTE — Patient Instructions (Addendum)
Good to see you  Gabapentin 200mg  nightly Nerve conduction study bilateral upper extremities placed  ABI study placed See me again in 6 weeks if needed we will be in touch before then

## 2021-04-19 ENCOUNTER — Encounter: Payer: Self-pay | Admitting: Family Medicine

## 2021-04-19 ENCOUNTER — Other Ambulatory Visit: Payer: Self-pay

## 2021-04-19 ENCOUNTER — Ambulatory Visit: Payer: Self-pay

## 2021-04-19 ENCOUNTER — Encounter: Payer: Self-pay | Admitting: Neurology

## 2021-04-19 ENCOUNTER — Ambulatory Visit (INDEPENDENT_AMBULATORY_CARE_PROVIDER_SITE_OTHER): Payer: Medicare Other | Admitting: Family Medicine

## 2021-04-19 VITALS — BP 110/70 | HR 84 | Ht 63.0 in | Wt 96.0 lb

## 2021-04-19 DIAGNOSIS — I773 Arterial fibromuscular dysplasia: Secondary | ICD-10-CM

## 2021-04-19 DIAGNOSIS — D510 Vitamin B12 deficiency anemia due to intrinsic factor deficiency: Secondary | ICD-10-CM | POA: Diagnosis not present

## 2021-04-19 DIAGNOSIS — M79641 Pain in right hand: Secondary | ICD-10-CM | POA: Diagnosis not present

## 2021-04-19 DIAGNOSIS — I701 Atherosclerosis of renal artery: Secondary | ICD-10-CM

## 2021-04-19 DIAGNOSIS — I150 Renovascular hypertension: Secondary | ICD-10-CM | POA: Diagnosis not present

## 2021-04-19 DIAGNOSIS — M79642 Pain in left hand: Secondary | ICD-10-CM

## 2021-04-19 DIAGNOSIS — M25532 Pain in left wrist: Secondary | ICD-10-CM

## 2021-04-19 DIAGNOSIS — M25531 Pain in right wrist: Secondary | ICD-10-CM

## 2021-04-19 MED ORDER — GABAPENTIN 100 MG PO CAPS: ORAL_CAPSULE | ORAL | 3 refills | Status: DC

## 2021-04-19 NOTE — Assessment & Plan Note (Signed)
Already taking B12

## 2021-04-19 NOTE — Assessment & Plan Note (Addendum)
Patient has intermittent cramping of the hands bilaterally.  Patient is also starting to get in with someone in the feet.  Differential includes the progression of the fibromuscular dysplasia or possible neurologic.  Once again we will be getting nerve conduction study and ABIs.  Started on a low-dose of gabapentin to see if this will be helpful.  Past medical history also significant for thyroid disease but patient states that she has been well controlled for some time now.  Most recent laboratory findings do not suggest any hyper or hypocalcemia that would be contributing at this moment.  Recently though has been on prednisone as well but has been having this intermittently for quite some time.  Follow-up with me otherwise in 6 weeks depending on what the studies show and patient's response to gabapentin.

## 2021-04-19 NOTE — Assessment & Plan Note (Signed)
Patient has had a history of fibromuscular dysplasia.  Has it in the renal artery, as well as the carotids and vertebrals.  Discussed with patient that this could be more peripheral as well.  ABI is ordered.  Differential also includes more neurologic.  Patient states that at 1 point they were concerned that patient could have the potential for multiple sclerosis.  He has seen other providers for that still but does not have a true diagnosis.  Will get nerve conduction study to further evaluate.  Ultrasound of the wrist today did not show any significant abnormality of the tendons.  Median nerve appeared to be remarkably normal as well.  Patient did have some mild limited range of motion of the neck but nothing serious.  May need potential further evaluation.  Started on low-dose gabapentin.  We will see how patient responds.

## 2021-04-22 ENCOUNTER — Other Ambulatory Visit: Payer: Self-pay

## 2021-04-22 ENCOUNTER — Ambulatory Visit (HOSPITAL_COMMUNITY)
Admission: RE | Admit: 2021-04-22 | Discharge: 2021-04-22 | Disposition: A | Discharge status: Home / Self Care | Payer: Medicare Other | Source: Ambulatory Visit | Attending: Cardiovascular Disease | Admitting: Cardiovascular Disease

## 2021-04-22 DIAGNOSIS — M79642 Pain in left hand: Secondary | ICD-10-CM | POA: Insufficient documentation

## 2021-04-22 DIAGNOSIS — R202 Paresthesia of skin: Secondary | ICD-10-CM

## 2021-04-22 DIAGNOSIS — M79641 Pain in right hand: Secondary | ICD-10-CM | POA: Insufficient documentation

## 2021-04-23 ENCOUNTER — Encounter: Payer: Self-pay | Admitting: Family Medicine

## 2021-04-29 ENCOUNTER — Other Ambulatory Visit: Payer: Self-pay

## 2021-04-29 DIAGNOSIS — R202 Paresthesia of skin: Secondary | ICD-10-CM

## 2021-05-07 DIAGNOSIS — L501 Idiopathic urticaria: Secondary | ICD-10-CM | POA: Diagnosis not present

## 2021-05-15 ENCOUNTER — Ambulatory Visit (INDEPENDENT_AMBULATORY_CARE_PROVIDER_SITE_OTHER): Payer: Medicare Other

## 2021-05-15 VITALS — Ht 63.0 in | Wt 96.0 lb

## 2021-05-15 DIAGNOSIS — Z0000 Encounter for general adult medical examination without abnormal findings: Secondary | ICD-10-CM | POA: Diagnosis not present

## 2021-05-15 NOTE — Progress Notes (Addendum)
Subjective:   Hailey Hunt is a 75 y.o. female who presents for Medicare Annual (Subsequent) preventive examination.  Review of Systems    No ROS.  Medicare Wellness Virtual Visit.  Visual/audio telehealth visit, UTA vital signs.   See social history for additional risk factors.   Cardiac Risk Factors include: advanced age (>46mn, >>62women)     Objective:    Today's Vitals   05/15/21 0905  Weight: 96 lb (43.5 kg)  Height: '5\' 3"'$  (1.6 m)   Body mass index is 17.01 kg/m.  Advanced Directives 05/15/2021 05/14/2020 05/12/2019 05/10/2018 12/17/2016 05/26/2016  Does Patient Have a Medical Advance Directive? Yes Yes Yes Yes Yes Yes  Type of AParamed[place]of [place]Living will HImpactLiving will HEd[place]Living will HPay[place] SpringsLiving will [place]Living will H[place]Living will  Does patient want to make changes to medical advance directive? No - Patient declined No - Patient declined No - Patient declined No - Patient declined No - Patient declined -  Copy of H[place]versein Chart? No - copy requested No - copy requested No - copy requested No - copy requested No - copy requested -    Current Medications (verified) Outpatient Encounter Medications as of 05/15/2021  Medication Sig   ASPIRIN 81 PO    cholecalciferol (VITAMIN D) 1000 UNITS tablet Take 1,000 Units by mouth daily.   cyanocobalamin (,VITAMIN B-12,) 1000 MCG/ML injection INJECT AS DIRECTED   Cyanocobalamin (VITAMIN B 12 PO) Take by mouth.   denosumab (PROLIA) 60 MG/ML SOSY injection    gabapentin (NEURONTIN) 100 MG capsule Take two tablets daily at bedtime (total '200MG'$ )   montelukast (SINGULAIR) 10 MG tablet Take 1 tablet (10 mg total) by mouth at bedtime.   omeprazole (PRILOSEC) 40 MG capsule Take 1 capsule (40 mg total) by mouth daily. (Patient not taking: No sig reported)    prednisoLONE acetate (PRED FORTE) 1 % ophthalmic suspension SMARTSIG:In Eye(s) (Patient not taking: Reported on 04/19/2021)   predniSONE (DELTASONE) 10 MG tablet Take 4 pills once daily by mouth for 3 days, then 3 pills daily for 3 days, then 2 pills daily for 3 days then 1 pill daily for 3 days then stop (Patient not taking: Reported on 04/19/2021)   SYNTHROID 50 MCG tablet TAKE ONE TABLET BY MOUTH DAILY ON AN EMPTY STOMACH WITH A FULL GLASS OF WATER ATLEAST 30 TO 60 MINUTES BEFORE BREAKFAST   Syringe/Needle, Disp, (SYRINGE 3CC/27GX1-1/4") 27G X 1-1/4" 3 ML MISC For B12 injections   traMADol (ULTRAM) 50 MG tablet Take 1 tablet (50 mg total) by mouth every 8 (eight) hours as needed.   tretinoin (RETIN-A) 0.025 % cream APPLY TOPICALLY AT BEDTIME   Varenicline Tartrate (TYRVAYA) 0.03 MG/ACT SOLN    No facility-administered encounter medications on file as of 05/15/2021.    Allergies (verified) Voltaren [diclofenac], Adhesive [tape], Demerol, Macrodantin, Nsaids, Voltaren [diclofenac sodium], Codeine, Nickel, Penicillins, and Sulfa antibiotics   History: Past Medical History:  Diagnosis Date   Aberrant right subclavian artery    Cataract    surgery   Clotting disorder (HCC)    hx of bleeding after vaginal delivery, dr told her not to have any surgeries that are necessary   Dysplasia, artery, fibromuscular, renal ([place]    Fibromuscular dysplasia ([place]    Fibrom[place] of [place] artery ([place]    managed by Dr. KGregary SignsNephrology   Graves disease  Heart murmur    Heart murmur    Hypertension    Kidney stone    Migraines    Neuropathy with IgA monoclonal gammopathy (HCC)    Dr Mervyn Skeeters   Paget's disease of the bone    Pernicious anemia    Thyroid disease    Urinary incontinence, mixed    resolved with removal of ained sutures in bladder    Past Surgical History:  Procedure Laterality Date   BILATERAL SALPINGOOPHORECTOMY     CATARACT EXTRACTION  Sep 10, 2011, Jan 13,2013    COLONOSCOPY     RENAL BIOPSY     TONSILLECTOMY     VAGINAL DELIVERY     x2   VAGINAL HYSTERECTOMY     WISDOM TOOTH EXTRACTION     Family History  Problem Relation Age of Onset   Anuerysm Mother    COPD Mother    Hypertension Mother    Heart disease Mother    Heart failure Mother    Cancer Father        lung CA   Heart disease Father    Heart attack Father    Heart disease Brother        viral cardiomyopathy, cocaine use   Drug abuse Brother    Cancer Maternal Aunt        esophageal and live rCA,  hisotory of  ETOH and tobacco   Anuerysm Maternal Grandfather    Cancer Paternal Grandmother        brain tumor   Diabetes Sister    Colon cancer Paternal Uncle    Social History   Socioeconomic History   Marital status: Married    Spouse name: Not on file   Number of children: 2   Years of education: Not on file   Highest education level: Not on file  Occupational History   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  Tobacco Use   Smoking status: Never   Smokeless tobacco: Never  Vaping Use   Vaping Use: Never used  Substance and Sexual Activity   Alcohol use: Yes    Comment: once a year   Drug use: No   Sexual activity: Not Currently  Other Topics Concern   Not on file  Social History Narrative   Not on file   Social Determinants of Health   Financial Resource Strain: Low Risk    Difficulty of Paying Living Expenses: Not hard at all  Food Insecurity: No Food Insecurity   Worried About Charity fundraiser in the Last Year: Never true   [place] in the Last Year: Never true  Transportation Needs: No Transportation Needs   Lack of Transportation (Medical): No   Lack of Transportation (Non-Medical): No  Physical Activity: Sufficiently Active   Days of Exercise per Week: 7 days   Minutes of Exercise per Session: 60 min  Stress: No Stress Concern Present   Feeling of Stress : Not at all  Social Connections: Unknown   Frequency of Communication with Friends and  Family: Not on file   Frequency of Social Gatherings with Friends and Family: Not on file   Attends Religious Services: Not on file   Active Member of Clubs or Organizations: Not on file   Attends Archivist Meetings: Not on file   Marital Status: Married    Tobacco Counseling Counseling given: Not Answered   Clinical Intake:  Pre-visit preparation completed: Yes        Diabetes: No  How  often do you need to have someone help you when you read instructions, pamphlets, or other written materials from your doctor or pharmacy?: 1 - Never   Interpreter Needed?: No      Activities of Daily Living In your present state of health, do you have any difficulty performing the following activities: 05/15/2021  Hearing? N  Vision? N  Difficulty concentrating or making decisions? N  Walking or climbing stairs? N  Dressing or bathing? N  Doing errands, shopping? N  Preparing Food and eating ? N  Using the Toilet? N  In the past six months, have you accidently leaked urine? N  Do you have problems with loss of bowel control? N  Managing your Medications? N  Managing your Finances? N  Housekeeping or managing your Housekeeping? N  Some recent data might be hidden    Patient Care Team: Crecencio Mc, MD as PCP - General (Internal Medicine) Kiser, Kerrie [place], MD (Nephrology) Minna Merritts, MD as Consulting Physician (Cardiology)  Indicate any recent Medical Services you may have received from other than [facility] providers in the past year (date may be approximate).     Assessment:   This is a routine wellness examination for Tyson.  I connected with Yunuen today by telephone and verified that I am speaking with the correct person using two identifiers. Location patient: home Location provider: work Persons participating in the virtual visit: patient, Marine scientist.    I discussed the limitations, risks, security and privacy concerns of performing an evaluation and  management service by telephone and the availability of in person appointments. The patient expressed understanding and verbally consented to this telephonic visit.    Interactive audio and video telecommunications were attempted between this provider and patient, however failed, due to patient having technical difficulties OR patient did not have access to video capability.  We continued and completed visit with audio only.  Some vital signs may be absent or patient reported.   Hearing/Vision screen Hearing Screening - Comments:: Patient is able to hear conversational tones without difficulty.  No issues reported. Vision Screening - Comments:: Followed by [facility]  Wears corrective lenses Cataract extraction, bilateral  Glaucoma; suspect They have seen their ophthalmologist in the last 12 months.   Dietary issues and exercise activities discussed: Current Exercise Habits: Home exercise routine, Type of exercise: walking, Time (Minutes): 60, Frequency (Times/Week): 7, Weekly Exercise (Minutes/Week): 420, Intensity: Moderate Healthy diet Protein drink Good water intake   Goals Addressed             This Visit's Progress    Follow up with Primary Care Provider       Keep routine maintenance appointments       Depression Screen [facility] 2/9 Scores 05/15/2021 04/04/2021 05/14/2020 05/12/2019 05/10/2018 12/17/2016 03/19/2016  PHQ - 2 Score 0 2 0 0 0 0 0  PHQ- 9 Score 0 4 - - - - -    Fall Risk Fall Risk  05/15/2021 04/04/2021 11/28/2020 11/15/2020 05/25/2020  Falls in the past year? 0 0 0 0 0  Comment - - - - -  Number falls in past yr: 0 - - - -  Injury with Fall? - - - - -  Follow up - Falls evaluation completed Falls evaluation completed Falls evaluation completed Falls evaluation completed    [place]: Adequate lighting in your home to reduce risk of falls? Yes   ASSISTIVE DEVICES UTILIZED TO PREVENT FALLS: Life  alert? No  Use of a cane,  walker or w/c? No   TIMED UP AND GO: Was the test performed? No .   Cognitive Function: Patient is alert and oriented x3. MMSE - Mini Mental State Exam 05/14/2020 05/10/2018 12/17/2016  Not completed: Unable to complete - -  Orientation to time - 5 5  Orientation to Place - 5 5  Registration - 3 3  Attention/ Calculation - 5 5  Recall - 3 3  Language- name 2 objects - 2 2  Language- repeat - 1 1  Language- follow 3 step command - 3 3  Language- read & follow direction - 1 1  Write a sentence - 1 1  Copy design - 1 1  Total score - 30 30     6CIT Screen 05/12/2019  What Year? 0 points  What month? 0 points  What time? 0 points  Count back from 20 0 points  Months in reverse 0 points  Repeat phrase 0 points  Total Score 0    Immunizations Immunization History  Administered Date(s) Administered   Fluad Quad(high Dose 65+) 05/30/2019   Influenza Nasal 07/09/2017   Influenza Split 07/13/2012, 07/13/2014   Influenza, High Dose Seasonal PF 07/21/2018   Influenza-Unspecified 07/04/2013, 06/30/2015, 07/14/2016, 05/30/2019, 06/22/2020   Moderna Sars-Covid-2 Vaccination 11/12/2019, 12/10/2019, 07/19/2020   PPD Test 11/19/2015   Pneumococcal Conjugate-13 07/28/2013   Pneumococcal Polysaccharide-23 07/07/2003, 07/06/2014   Tdap 07/14/2013   Zoster Recombinat (Shingrix) 04/10/2018, 06/07/2018   Zoster, Live 09/14/2008   Health Maintenance Health Maintenance  Topic Date Due   COVID-19 Vaccine (4 - Booster for Moderna series) 05/31/2021 (Originally 10/19/2020)   INFLUENZA VACCINE  07/01/2021 (Originally 04/29/2021)   MAMMOGRAM  03/18/2022   TETANUS/TDAP  07/15/2023   COLONOSCOPY (Pts 45-70yr Insurance coverage will need to be confirmed)  05/26/2026   DEXA SCAN  Completed   Hepatitis C Screening  Completed   PNA vac Low Risk Adult  Completed   Zoster Vaccines- Shingrix  Completed   HPV VACCINES  Aged Out   Lung Cancer Screening: (Low Dose CT Chest recommended if Age 66-80  years, 30 pack-year currently smoking OR have quit w/in 15years.) does not qualify.   Vision Screening: Recommended annual ophthalmology exams for early detection of glaucoma and other disorders of the eye. Is the patient up to date with their annual eye exam?  Yes   Dental Screening: Recommended annual dental exams for proper oral hygiene  Community Resource Referral / Chronic Care Management: CRR required this visit?  No   CCM required this visit?  No      Plan:   Keep all routine maintenance appointments.   I have personally reviewed and noted the following in the patient's chart:   Medical and social history Use of alcohol, tobacco or illicit drugs  Current medications and supplements including opioid prescriptions. Patient is not currently taking opioid.  Functional ability and status Nutritional status Physical activity Advanced directives List of other physicians Hospitalizations, surgeries, and ER visits in previous 12 months Vitals Screenings to include cognitive, depression, and falls Referrals and appointments  In addition, I have reviewed and discussed with patient certain preventive protocols, quality metrics, and best practice recommendations. A written personalized care plan for preventive services as well as general preventive health recommendations were provided to patient via mychart.     OBrien-Blaney, Jamani Bearce L, LPN   [id number]    I have reviewed the above information and agree with above.  Deborra Medina, MD

## 2021-05-15 NOTE — Patient Instructions (Signed)
Hailey Hunt , Thank you for taking time to come for your Medicare Wellness Visit. I appreciate your ongoing commitment to your health goals. Please review the following plan we discussed and let me know if I can assist you in the future.   These are the goals we discussed:  Goals      Follow up with Primary Care Provider     Keep routine maintenance appointments        This is a list of the screening recommended for you and due dates:  Health Maintenance  Topic Date Due   COVID-19 Vaccine (4 - Booster for Moderna series) 05/31/2021*   Flu Shot  07/01/2021*   Mammogram  03/18/2022   Tetanus Vaccine  07/15/2023   Colon Cancer Screening  05/26/2026   DEXA scan (bone density measurement)  Completed   Hepatitis C Screening: USPSTF Recommendation to screen - Ages 46-79 yo.  Completed   Pneumonia vaccines  Completed   Zoster (Shingles) Vaccine  Completed   HPV Vaccine  Aged Out  *Topic was postponed. The date shown is not the original due date.    Advanced directives: End of life planning; Advance aging; Advanced directives discussed.  Copy of current HCPOA/Living Will requested.    Conditions/risks identified: none new  Follow up in one year for your annual wellness visit    Preventive Care 65 Years and Older, Female Preventive care refers to lifestyle choices and visits with your health care provider that can promote health and wellness. What does preventive care include? A yearly physical exam. This is also called an annual well check. Dental exams once or twice a year. Routine eye exams. Ask your health care provider how often you should have your eyes checked. Personal lifestyle choices, including: Daily care of your teeth and gums. Regular physical activity. Eating a healthy diet. Avoiding tobacco and drug use. Limiting alcohol use. Practicing safe sex. Taking low-dose aspirin every day. Taking vitamin and mineral supplements as recommended by your health care  provider. What happens during an annual well check? The services and screenings done by your health care provider during your annual well check will depend on your age, overall health, lifestyle risk factors, and family history of disease. Counseling  Your health care provider may ask you questions about your: Alcohol use. Tobacco use. Drug use. Emotional well-being. Home and relationship well-being. Sexual activity. Eating habits. History of falls. Memory and ability to understand (cognition). Work and work Statistician. Reproductive health. Screening  You may have the following tests or measurements: Height, weight, and BMI. Blood pressure. Lipid and cholesterol levels. These may be checked every 5 years, or more frequently if you are over 76 years old. Skin check. Lung cancer screening. You may have this screening every year starting at age 20 if you have a 30-pack-year history of smoking and currently smoke or have quit within the past 15 years. Fecal occult blood test (FOBT) of the stool. You may have this test every year starting at age 48. Flexible sigmoidoscopy or colonoscopy. You may have a sigmoidoscopy every 5 years or a colonoscopy every 10 years starting at age 57. Hepatitis C blood test. Hepatitis B blood test. Sexually transmitted disease (STD) testing. Diabetes screening. This is done by checking your blood sugar (glucose) after you have not eaten for a while (fasting). You may have this done every 1-3 years. Bone density scan. This is done to screen for osteoporosis. You may have this done starting at age 47. Mammogram.  This may be done every 1-2 years. Talk to your health care provider about how often you should have regular mammograms. Talk with your health care provider about your test results, treatment options, and if necessary, the need for more tests. Vaccines  Your health care provider may recommend certain vaccines, such as: Influenza vaccine. This is  recommended every year. Tetanus, diphtheria, and acellular pertussis (Tdap, Td) vaccine. You may need a Td booster every 10 years. Zoster vaccine. You may need this after age 60. Pneumococcal 13-valent conjugate (PCV13) vaccine. One dose is recommended after age 86. Pneumococcal polysaccharide (PPSV23) vaccine. One dose is recommended after age 80. Talk to your health care provider about which screenings and vaccines you need and how often you need them. This information is not intended to replace advice given to you by your health care provider. Make sure you discuss any questions you have with your health care provider. Document Released: 10/12/2015 Document Revised: 06/04/2016 Document Reviewed: 07/17/2015 Elsevier Interactive Patient Education  2017 [place] Prevention in the Home Falls can cause injuries. They can happen to people of all ages. There are many things you can do to make your home safe and to help prevent falls. What can I do on the outside of my home? Regularly fix the edges of walkways and driveways and fix any cracks. Remove anything that might make you trip as you walk through a door, such as a raised step or threshold. Trim any bushes or trees on the path to your home. Use bright outdoor lighting. Clear any walking paths of anything that might make someone trip, such as rocks or tools. Regularly check to see if handrails are loose or broken. Make sure that both sides of any steps have handrails. Any raised decks and porches should have guardrails on the edges. Have any leaves, snow, or ice cleared regularly. Use sand or salt on walking paths during winter. Clean up any spills in your garage right away. This includes oil or grease spills. What can I do in the bathroom? Use night lights. Install grab bars by the toilet and in the tub and shower. Do not use towel bars as grab bars. Use non-skid mats or decals in the tub or shower. If you need to sit down in  the shower, use a plastic, non-slip stool. Keep the floor dry. Clean up any water that spills on the floor as soon as it happens. Remove soap buildup in the tub or shower regularly. Attach bath mats securely with double-sided non-slip rug tape. Do not have throw rugs and other things on the floor that can make you trip. What can I do in the bedroom? Use night lights. Make sure that you have a light by your bed that is easy to reach. Do not use any sheets or blankets that are too big for your bed. They should not hang down onto the floor. Have a firm chair that has side arms. You can use this for support while you get dressed. Do not have throw rugs and other things on the floor that can make you trip. What can I do in the kitchen? Clean up any spills right away. Avoid walking on wet floors. Keep items that you use a lot in easy-to-reach places. If you need to reach something above you, use a strong step stool that has a grab bar. Keep electrical cords out of the way. Do not use floor polish or wax that makes floors slippery. If you  must use wax, use non-skid floor wax. Do not have throw rugs and other things on the floor that can make you trip. What can I do with my stairs? Do not leave any items on the stairs. Make sure that there are handrails on both sides of the stairs and use them. Fix handrails that are broken or loose. Make sure that handrails are as long as the stairways. Check any carpeting to make sure that it is firmly attached to the stairs. Fix any carpet that is loose or worn. Avoid having throw rugs at the top or bottom of the stairs. If you do have throw rugs, attach them to the floor with carpet tape. Make sure that you have a light switch at the top of the stairs and the bottom of the stairs. If you do not have them, ask someone to add them for you. What else can I do to help prevent falls? Wear shoes that: Do not have high heels. Have rubber bottoms. Are comfortable  and fit you well. Are closed at the toe. Do not wear sandals. If you use a stepladder: Make sure that it is fully opened. Do not climb a closed stepladder. Make sure that both sides of the stepladder are locked into place. Ask someone to hold it for you, if possible. Clearly mark and make sure that you can see: Any grab bars or handrails. First and last steps. Where the edge of each step is. Use tools that help you move around (mobility aids) if they are needed. These include: Canes. Walkers. Scooters. Crutches. Turn on the lights when you go into a dark area. Replace any light bulbs as soon as they burn out. Set up your furniture so you have a clear path. Avoid moving your furniture around. If any of your floors are uneven, fix them. If there are any pets around you, be aware of where they are. Review your medicines with your doctor. Some medicines can make you feel dizzy. This can increase your chance of falling. Ask your doctor what other things that you can do to help prevent falls. This information is not intended to replace advice given to you by your health care provider. Make sure you discuss any questions you have with your health care provider. Document Released: 07/12/2009 Document Revised: 02/21/2016 Document Reviewed: 10/20/2014 Elsevier Interactive Patient Education  2017 [employer].

## 2021-05-28 ENCOUNTER — Other Ambulatory Visit: Payer: Self-pay

## 2021-05-28 ENCOUNTER — Ambulatory Visit (INDEPENDENT_AMBULATORY_CARE_PROVIDER_SITE_OTHER): Payer: Medicare Other | Admitting: Neurology

## 2021-05-28 DIAGNOSIS — R202 Paresthesia of skin: Secondary | ICD-10-CM | POA: Diagnosis not present

## 2021-05-28 NOTE — Procedures (Signed)
[facility]  [address]  [place], [place] [address] Tel: [phone] Fax:  [phone] Test Date:  05/28/2021  Patient: Hailey Hunt DOB: 1946/03/30 Physician: Narda Amber, DO  Sex: Female Height: '5\' 3"'$  Ref Phys: Hulan Saas, DO  ID#: [id number]   Technician:    Patient Complaints: This is a 75 year old female referred for evaluation of bilateral hand pain.  NCV & EMG Findings: Extensive electrodiagnostic testing of the right upper extremity and additional studies of the left shows: Bilateral median, ulnar, and mixed palmar sensory responses are within normal limits. Right median motor response shows reduced amplitude, however this is due to the presence of a Martin-Gruber anastomoses, a normal anatomic variant.  Left median motor responses within normal limits.  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 Bilateral ulnar motor responses are within normal limits. There is no evidence of active or chronic motor axonal loss changes affecting any of the tested muscles.  Motor unit configuration and recruitment pattern is within normal limits.  Impression: This is a normal study of the upper extremities.  In particular, there is no evidence of carpal tunnel syndrome, ulnar neuropathy, or a cervical radiculopathy.  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   ___________________________ Narda Amber, DO    Nerve Conduction Studies Anti Sensory Summary Table   Stim Site NR Peak (ms) Norm Peak (ms) P-T Amp (V) Norm P-T Amp  Left Median Anti Sensory (2nd Digit)  34C  Wrist    3.1 <3.8 51.0 >10  Right Median Anti Sensory (2nd Digit)  34C  Wrist    3.1 <3.8 51.9 >10  Left Ulnar Anti Sensory (5th Digit)  34C  Wrist    3.0 <3.2 53.5 >5   Right Ulnar Anti Sensory (5th Digit)  34C  Wrist    2.7 <3.2 50.4 >5   Motor Summary Table   Stim Site NR Onset (ms) Norm Onset (ms) O-P Amp (mV) Norm O-P Amp Site1 Site2 Delta-0 (ms) Dist (cm) Vel (m/s) Norm Vel (m/s)  Left Median Motor (Abd Poll Brev)  34C  Wrist    3.4 <4.0 5.9 >5 Elbow Wrist 4.9 27.0 55 >50  Elbow    8.3  9.8  Ulnar-wrist crossover Elbow 4.2 0.0    Ulnar-wrist crossover    4.1  6.7         Right Median Motor (Abd Poll Brev)  34C  Wrist    3.2 <4.0 4.0 >5 Elbow Wrist 4.6 28.0 61 >50  Elbow    7.8  9.8  Ulnar-wrist crossover Elbow 4.1 0.0    Ulnar-wrist crossover    3.7  7.8         Left Ulnar Motor (Abd Dig Minimi)  34C  Wrist    2.3 <3.1 8.0 >7 B Elbow Wrist 3.0 20.0 67 >50  B Elbow    5.3  7.8  A Elbow B Elbow 1.5 10.0 67 >50  A Elbow    6.8  7.8         Right Ulnar Motor (Abd Dig Minimi)  34C  Wrist    2.3 <3.1 10.5 >7 B Elbow Wrist 3.1 20.0 65 >50  B Elbow    5.4  9.1  A Elbow B Elbow 1.5 10.0 67 >50  A Elbow    6.9  8.7  Comparison Summary Table   Stim Site NR Peak (ms) Norm Peak (ms) P-T Amp (V) Site1 Site2 Delta-P (ms) Norm Delta (ms)  Left Median/Ulnar Palm Comparison (Wrist - 8cm)  34C  Median Palm    1.7 <2.2 86.5 Median Palm Ulnar Palm 0.1   Ulnar Palm    1.8 <2.2 57.0      Right Median/Ulnar Palm Comparison (Wrist - 8cm)  34C  Median Palm    1.7 <2.2 73.8 Median Palm Ulnar Palm 0.0   Ulnar Palm    1.7 <2.2 36.2       EMG   Side Muscle Ins Act Fibs Psw Fasc Number Recrt Dur Dur. Amp Amp. Poly Poly. Comment  Right 1stDorInt Nml Nml Nml Nml Nml Nml Nml Nml Nml Nml Nml Nml N/A  Right Ext Indicis Nml Nml Nml Nml Nml Nml Nml Nml Nml Nml Nml Nml N/A  Right PronatorTeres Nml Nml Nml Nml Nml Nml Nml Nml Nml Nml Nml Nml N/A  Right Biceps Nml Nml Nml Nml Nml Nml Nml Nml Nml Nml Nml Nml N/A  Right Triceps Nml Nml Nml Nml Nml Nml Nml Nml Nml Nml Nml Nml N/A  Right Deltoid Nml Nml Nml Nml Nml Nml Nml Nml Nml Nml Nml Nml N/A  Left 1stDorInt  Nml Nml Nml Nml Nml Nml Nml Nml Nml Nml Nml Nml N/A  Left PronatorTeres Nml Nml Nml Nml Nml Nml Nml Nml Nml Nml Nml Nml N/A  Left Biceps Nml Nml Nml Nml Nml Nml Nml Nml Nml Nml Nml Nml N/A  Left Triceps Nml Nml Nml Nml Nml Nml Nml Nml Nml Nml Nml Nml N/A  Left Deltoid Nml Nml Nml Nml Nml Nml Nml Nml Nml Nml Nml Nml N/A      Waveforms:

## 2021-05-29 ENCOUNTER — Ambulatory Visit: Payer: Self-pay

## 2021-05-29 ENCOUNTER — Ambulatory Visit (INDEPENDENT_AMBULATORY_CARE_PROVIDER_SITE_OTHER): Payer: Medicare Other | Admitting: Family Medicine

## 2021-05-29 VITALS — BP 104/66 | HR 75 | Ht 63.0 in | Wt 95.0 lb

## 2021-05-29 DIAGNOSIS — I701 Atherosclerosis of renal artery: Secondary | ICD-10-CM | POA: Diagnosis not present

## 2021-05-29 DIAGNOSIS — I150 Renovascular hypertension: Secondary | ICD-10-CM

## 2021-05-29 DIAGNOSIS — M79641 Pain in right hand: Secondary | ICD-10-CM | POA: Diagnosis not present

## 2021-05-29 DIAGNOSIS — I773 Arterial fibromuscular dysplasia: Secondary | ICD-10-CM

## 2021-05-29 DIAGNOSIS — M79642 Pain in left hand: Secondary | ICD-10-CM

## 2021-05-29 DIAGNOSIS — E890 Postprocedural hypothyroidism: Secondary | ICD-10-CM | POA: Diagnosis not present

## 2021-05-29 DIAGNOSIS — D510 Vitamin B12 deficiency anemia due to intrinsic factor deficiency: Secondary | ICD-10-CM | POA: Diagnosis not present

## 2021-05-29 DIAGNOSIS — E559 Vitamin D deficiency, unspecified: Secondary | ICD-10-CM

## 2021-05-29 NOTE — Patient Instructions (Signed)
Update in MyChart in 6 weeks Tart Cherry Extract '1200mg'$  at night

## 2021-05-29 NOTE — Progress Notes (Signed)
[place] Phone: [phone] Subjective:   Fontaine No, am serving as a scribe for Dr. Hulan Saas.  This visit occurred during the SARS-CoV-2 public health emergency.  Safety protocols were in place, including screening questions prior to the visit, additional usage of staff PPE, and extensive cleaning of exam room while observing appropriate contact time as indicated for disinfecting solutions.   I'm seeing this patient by the request  of:  Hailey Mc, MD  CC: Wrist and hand pain follow-up  [id number]  Hailey Hunt is a 75 y.o. female coming in with complaint of bilateral hand pain.  Also is having some discomfort in the feet.  Concerned with patient's past medical history of fibromuscular dysplasia.  Patient was sent for bilateral ABIs that were unremarkable except left toe-brachial index abnormal patient also recently had a nerve conduction study that was unremarkable. Patient's pain is the same as last visit.        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HCC)    Dr Mervyn Skeeters   Paget's disease of the bone    Pernicious anemia    Thyroid disease    Urinary incontinence, mixed    resolved with removal of ained sutures in bladder    Past Surgical History:  Procedure Laterality Date   BILATERAL SALPINGOOPHORECTOMY     CATARACT EXTRACTION  Sep 10, 2011, Jan 13,2013   COLONOSCOPY     RENAL BIOPSY      TONSILLECTOMY     VAGINAL DELIVERY     x2   VAGINAL HYSTERECTOMY     WISDOM TOOTH EXTRACTION     Social History   Socioeconomic History   Marital status: Married    Spouse name: Not on file   Number of children: 2   Years of education: Not on file   Highest education level: Not on file  Occupational History   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  Tobacco Use   Smoking status: Never   Smokeless tobacco: Never  Vaping Use   Vaping Use: Never used  Substance and Sexual Activity   Alcohol use: Yes    Comment: once a year   Drug use: No   Sexual activity: Not Currently  Other Topics Concern   Not on file  Social History Narrative   Not on file   Social Determinants of Health   Financial Resource Strain: Low Risk    Difficulty of Paying Living Expenses: Not hard at all  Food Insecurity: No Food Insecurity   Worried About Charity fundraiser in the Last Year: Never true   [place] in the Last Year: Never true  Transportation Needs: No Transportation Needs   Lack of Transportation (Medical): No   Lack of Transportation (Non-Medical): No  Physical Activity: Sufficiently Active   Days of Exercise per  Week: 7 days   Minutes of Exercise per Session: 60 min  Stress: No Stress Concern Present   Feeling of Stress : Not at all  Social Connections: Unknown   Frequency of Communication with Friends and Family: Not on file   Frequency of Social Gatherings with Friends and Family: Not on file   Attends Religious Services: Not on file   Active Member of Clubs or Organizations: Not on file   Attends Archivist Meetings: Not on file   Marital Status: Married   Allergies  Allergen Reactions   Voltaren [Diclofenac] Hypertension   Adhesive [Tape]    Demerol     HYPOTENSION   Macrodantin    Nsaids Hypertension   Voltaren [Diclofenac Sodium] Other (See Comments)    FMD, increased bp   Codeine Nausea And Vomiting and Rash   Nickel Rash   Penicillins Rash   Sulfa  Antibiotics Rash    Fever   Family History  Problem Relation Age of Onset   Anuerysm Mother    COPD Mother    Hypertension Mother    Heart disease Mother    Heart failure Mother    Cancer Father        lung CA   Heart disease Father    Heart attack Father    Heart disease Brother        viral cardiomyopathy, cocaine use   Drug abuse Brother    Cancer Maternal Aunt        esophageal and live rCA,  hisotory of  ETOH and tobacco   Anuerysm Maternal Grandfather    Cancer Paternal Grandmother        brain tumor   Diabetes Sister    Colon cancer Paternal Uncle     Current Outpatient Medications (Endocrine & Metabolic):    denosumab (PROLIA) 60 MG/ML SOSY injection,    predniSONE (DELTASONE) 10 MG tablet, Take 4 pills once daily by mouth for 3 days, then 3 pills daily for 3 days, then 2 pills daily for 3 days then 1 pill daily for 3 days then stop   SYNTHROID 50 MCG tablet, TAKE ONE TABLET BY MOUTH DAILY ON AN EMPTY STOMACH WITH A FULL GLASS OF WATER ATLEAST 30 TO 60 MINUTES BEFORE BREAKFAST   Current Outpatient Medications (Respiratory):    montelukast (SINGULAIR) 10 MG tablet, Take 1 tablet (10 mg total) by mouth at bedtime.  Current Outpatient Medications (Analgesics):    ASPIRIN 81 PO,    traMADol (ULTRAM) 50 MG tablet, Take 1 tablet (50 mg total) by mouth every 8 (eight) hours as needed.  Current Outpatient Medications (Hematological):    cyanocobalamin (,VITAMIN B-12,) 1000 MCG/ML injection, INJECT AS DIRECTED   Cyanocobalamin (VITAMIN B 12 PO), Take by mouth.  Current Outpatient Medications (Other):    cholecalciferol (VITAMIN D) 1000 UNITS tablet, Take 1,000 Units by mouth daily.   gabapentin (NEURONTIN) 100 MG capsule, Take two tablets daily at bedtime (total '200MG'$ )   omeprazole (PRILOSEC) 40 MG capsule, Take 1 capsule (40 mg total) by mouth daily.   prednisoLONE acetate (PRED FORTE) 1 % ophthalmic suspension,    Syringe/Needle, Disp, (SYRINGE 3CC/27GX1-1/4") 27G X  1-1/4" 3 ML MISC, For B12 injections   tretinoin (RETIN-A) 0.025 % cream, APPLY TOPICALLY AT BEDTIME   Varenicline Tartrate (TYRVAYA) 0.03 MG/ACT SOLN,    Review of Systems:  No headache, visual changes, nausea, vomiting, diarrhea, constipation, dizziness, abdominal pain, skin rash, fevers, chills, night sweats, weight loss, swollen lymph nodes, chest  pain, shortness of breath, mood changes. POSITIVE muscle aches, body aches, joint swelling  Objective  Blood pressure 104/66, pulse 75, height '5\' 3"'$  (1.6 m), weight 95 lb (43.1 kg), SpO2 99 %.   General: No apparent distress alert and oriented x3 mood and affect normal, dressed appropriately.  HEENT: Pupils equal, extraocular movements intact  Respiratory: Patient's speak in full sentences and does not appear short of breath  Cardiovascular: No lower extremity edema, non tender, no erythema  Gait normal with good balance and coordination.  MSK: Patient is sitting comfortably.  Hand exam does not show any significant swelling at this moment.  Patient does have some mild dry skin.   Impression and Recommendations:     The above documentation has been reviewed and is accurate and complete Lyndal Pulley, DO

## 2021-05-29 NOTE — Assessment & Plan Note (Signed)
-  Continue B12 supplementation °

## 2021-05-29 NOTE — Assessment & Plan Note (Signed)
Continue to follow-up with general surgery.

## 2021-05-29 NOTE — Assessment & Plan Note (Addendum)
Patient does have more of a fibromuscular dysplasia.  Discussed with patient that this could be contributing to some of the bilateral hand pain.  Patient's her most recent ABIs were fairly unremarkable.  Patient is taking B12, discussed all the other possibilities.  Patient has some signs that are consistent with carpal tunnel but does not seem to be always.  Discussed avoiding certain activities and icing regimen.  Discussed home exercises.  We will continue to monitor but I do not think any other enlarged changes would be beneficial at this time. Patient did have an abnormal ABI.  We will be following up with her specialist for the fibromuscular dysplasia.

## 2021-05-29 NOTE — Assessment & Plan Note (Signed)
Continue vitamin D supplementation 

## 2021-05-31 ENCOUNTER — Other Ambulatory Visit: Payer: Self-pay

## 2021-05-31 ENCOUNTER — Ambulatory Visit (INDEPENDENT_AMBULATORY_CARE_PROVIDER_SITE_OTHER): Payer: Medicare Other | Admitting: Internal Medicine

## 2021-05-31 VITALS — BP 108/70 | HR 86 | Temp 97.6°F | Resp 16 | Ht 63.0 in | Wt 94.2 lb

## 2021-05-31 DIAGNOSIS — D72829 Elevated white blood cell count, unspecified: Secondary | ICD-10-CM | POA: Diagnosis not present

## 2021-05-31 DIAGNOSIS — M79642 Pain in left hand: Secondary | ICD-10-CM | POA: Diagnosis not present

## 2021-05-31 DIAGNOSIS — E441 Mild protein-calorie malnutrition: Secondary | ICD-10-CM | POA: Diagnosis not present

## 2021-05-31 DIAGNOSIS — T7840XS Allergy, unspecified, sequela: Secondary | ICD-10-CM

## 2021-05-31 DIAGNOSIS — M79641 Pain in right hand: Secondary | ICD-10-CM

## 2021-05-31 DIAGNOSIS — E559 Vitamin D deficiency, unspecified: Secondary | ICD-10-CM

## 2021-05-31 DIAGNOSIS — R7303 Prediabetes: Secondary | ICD-10-CM

## 2021-05-31 DIAGNOSIS — I701 Atherosclerosis of renal artery: Secondary | ICD-10-CM

## 2021-05-31 DIAGNOSIS — M810 Age-related osteoporosis without current pathological fracture: Secondary | ICD-10-CM | POA: Diagnosis not present

## 2021-05-31 DIAGNOSIS — D510 Vitamin B12 deficiency anemia due to intrinsic factor deficiency: Secondary | ICD-10-CM | POA: Diagnosis not present

## 2021-05-31 DIAGNOSIS — R252 Cramp and spasm: Secondary | ICD-10-CM | POA: Diagnosis not present

## 2021-05-31 DIAGNOSIS — I150 Renovascular hypertension: Secondary | ICD-10-CM

## 2021-05-31 LAB — COMPREHENSIVE METABOLIC PANEL
ALT: 18 U/L (ref 0–35)
AST: 23 U/L (ref 0–37)
Albumin: 4.3 g/dL (ref 3.5–5.2)
Alkaline Phosphatase: 60 U/L (ref 39–117)
BUN: 20 mg/dL (ref 6–23)
CO2: 28 mEq/L (ref 19–32)
Calcium: 9.7 mg/dL (ref 8.4–10.5)
Chloride: 107 mEq/L (ref 96–112)
Creatinine, Ser: 1.01 mg/dL (ref 0.40–1.20)
GFR: 55.5 mL/min — ABNORMAL LOW (ref >60.00)
Glucose, Bld: 85 mg/dL (ref 70–99)
Potassium: 4.4 mEq/L (ref 3.5–5.1)
Sodium: 143 mEq/L (ref 135–145)
Total Bilirubin: 0.5 mg/dL (ref 0.2–1.2)
Total Protein: 6.2 g/dL (ref 6.0–8.3)

## 2021-05-31 LAB — CBC WITH DIFFERENTIAL/PLATELET
Basophils Absolute: 0 10*3/uL (ref 0.0–0.1)
Basophils Relative: 0.9 % (ref 0.0–3.0)
Eosinophils Absolute: 0.1 10*3/uL (ref 0.0–0.7)
Eosinophils Relative: 1 % (ref 0.0–5.0)
HCT: 36.6 % (ref 36.0–46.0)
Hemoglobin: 11.8 g/dL — ABNORMAL LOW (ref 12.0–15.0)
Lymphocytes Relative: 24.4 % (ref 12.0–46.0)
Lymphs Abs: 1.3 10*3/uL (ref 0.7–4.0)
MCHC: 32.2 g/dL (ref 30.0–36.0)
MCV: 92.6 fl (ref 78.0–100.0)
Monocytes Absolute: 0.3 10*3/uL (ref 0.1–1.0)
Monocytes Relative: 6.2 % (ref 3.0–12.0)
Neutro Abs: 3.6 10*3/uL (ref 1.4–7.7)
Neutrophils Relative %: 67.5 % (ref 43.0–77.0)
Platelets: 231 10*3/uL (ref 150.0–400.0)
RBC: 3.95 Mil/uL (ref 3.87–5.11)
RDW: 15 % (ref 11.5–15.5)
WBC: 5.3 10*3/uL (ref 4.0–10.5)

## 2021-05-31 LAB — VITAMIN D 25 HYDROXY (VIT D DEFICIENCY, FRACTURES): VITD: 43.19 ng/mL (ref 30.00–100.00)

## 2021-05-31 LAB — VITAMIN B12: Vitamin B-12: >1550 pg/mL — ABNORMAL HIGH (ref 211–911)

## 2021-05-31 NOTE — Patient Instructions (Addendum)
Good to see you!   Consider dill pickle juice one ounce daily for prevention of cramping  Consider lowest dose of mirtazapine for sleep issues (1/2 of 7.5 mg )

## 2021-05-31 NOTE — Progress Notes (Signed)
Subjective:  Patient ID: Hailey Hunt, female    DOB: 02/22/1946  Age: 75 y.o. MRN: [id number]  CC: The primary encounter diagnosis was Pernicious anemia. 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  HPI Hailey Hunt presents for follow up on chronic issues including FMD, prediabetes and hypothyroidism  This visit occurred during the SARS-CoV-2 public health emergency.  Safety protocols were in place, including screening questions prior to the visit, additional usage of staff PPE, and extensive cleaning of exam room while observing appropriate contact time as indicated for disinfecting solutions.   History of recurrent muscle spasms involving hands and feet  and occurring at night.  Wrist pain:  referred to neurology for parasthesias involving hands,  studies were normal by EMG august 30  Had normal ABI's  on July 25 but decreased flow noted in the left big toe.   Not sleeping well,  tried gabapentin with low dose 100-200 mg  but not helping.  Use of medications limited by her need to sleep lightly unless her daughter has a seizure.   remeron offered.    Getting UROGYN. Pelvic floor  PT prescribed by Sharlett Iles  helping constipation    Using tart cherry   Lab Results  Component Value Date   HGBA1C 5.8 11/28/2020   Recurrent rash . Has a sentinel spot on the left posterior elbow,   uisng zyrtec or 20 mg . discussed add famotidine 20 every 12 hours/   Using IM and SL b12  since diagnosis in 2014     Lab Results  Component Value Date   CREATININE 1.01 05/31/2021     Outpatient Medications Prior to Visit  Medication Sig Dispense Refill    ASPIRIN 81 PO      cholecalciferol (VITAMIN D) 1000 UNITS tablet Take 1,000 Units by mouth daily.     cyanocobalamin (,VITAMIN B-12,) 1000 MCG/ML injection INJECT AS DIRECTED 1 mL 11   Cyanocobalamin (VITAMIN B 12 PO) Take by mouth.     denosumab (PROLIA) 60 MG/ML SOSY injection      gabapentin (NEURONTIN) 100 MG capsule Take two tablets daily at bedtime (total '200MG'$ ) 60 capsule 3   montelukast (SINGULAIR) 10 MG tablet Take 1 tablet (10 mg total) by mouth at bedtime. 30 tablet 3   omeprazole (PRILOSEC) 40 MG capsule Take 1 capsule (40 mg total) by mouth daily. 30 capsule 3   prednisoLONE acetate (PRED FORTE) 1 % ophthalmic suspension      predniSONE (DELTASONE) 10 MG tablet Take 4 pills once daily by mouth for 3 days, then 3 pills daily for 3 days, then 2 pills daily for 3 days then 1 pill daily for 3 days then stop 30 tablet 0   SYNTHROID 50 MCG tablet TAKE ONE TABLET BY MOUTH DAILY ON AN EMPTY STOMACH WITH A FULL GLASS OF WATER ATLEAST 30 TO 60 MINUTES BEFORE BREAKFAST 90 tablet 1   Syringe/Needle, Disp, (SYRINGE 3CC/27GX1-1/4") 27G X 1-1/4" 3 ML MISC For B12 injections 100 each 0   traMADol (ULTRAM) 50 MG tablet Take 1 tablet (50 mg total) by mouth every 8 (eight) hours as needed. 30 tablet 3   tretinoin (RETIN-A) 0.025 % cream APPLY TOPICALLY AT BEDTIME 45 g 1   Varenicline Tartrate (TYRVAYA) 0.03 MG/ACT  SOLN      No facility-administered medications prior to visit.    Review of Systems;  Patient denies headache, fevers, malaise, unintentional weight loss, skin rash, eye pain, sinus congestion and sinus pain, sore throat, dysphagia,  hemoptysis , cough, dyspnea, wheezing, chest pain, palpitations, orthopnea, edema, abdominal pain, nausea, melena, diarrhea, constipation, flank pain, dysuria, hematuria, urinary  Frequency, nocturia, numbness, tingling, seizures,  Focal weakness, Loss of consciousness,  Tremor, insomnia, depression, anxiety, and suicidal ideation.      Objective:  BP  108/70   Pulse 86   Temp 97.6 F (36.4 C)   Resp 16   Ht '5\' 3"'$  (1.6 m)   Wt 94 lb 3.2 oz (42.7 kg)   SpO2 98%   BMI 16.69 kg/m   BP Readings from Last 3 Encounters:  05/31/21 108/70  05/29/21 104/66  04/19/21 110/70    Wt Readings from Last 3 Encounters:  05/31/21 94 lb 3.2 oz (42.7 kg)  05/29/21 95 lb (43.1 kg)  05/15/21 96 lb (43.5 kg)    General appearance: alert, cooperative and appears stated age Ears: normal TM's and external ear canals both ears Throat: lips, mucosa, and tongue normal; teeth and gums normal Neck: no adenopathy, no carotid bruit, supple, symmetrical, trachea midline and thyroid not enlarged, symmetric, no tenderness/mass/nodules Back: symmetric, no curvature. ROM normal. No CVA tenderness. Lungs: clear to auscultation bilaterally Heart: regular rate and rhythm, S1, S2 normal, no murmur, click, rub or gallop Abdomen: soft, non-tender; bowel sounds normal; no masses,  no organomegaly Pulses: 2+ and symmetric Skin: Skin color, texture, turgor normal. No rashes or lesions Lymph nodes: Cervical, supraclavicular, and axillary nodes normal.  Lab Results  Component Value Date   HGBA1C 5.8 11/28/2020   HGBA1C 6.0 05/25/2020   HGBA1C 5.9 05/23/2019    Lab Results  Component Value Date   CREATININE 1.01 05/31/2021   CREATININE 0.89 11/28/2020   CREATININE 0.98 05/25/2020    Lab Results  Component Value Date   WBC 5.3 05/31/2021   HGB 11.8 (L) 05/31/2021   HCT 36.6 05/31/2021   PLT 231.0 05/31/2021   GLUCOSE 85 05/31/2021   CHOL 198 11/28/2020   TRIG 62.0 11/28/2020   HDL 87.10 11/28/2020   LDLDIRECT 92.0 11/19/2015   LDLCALC 98 11/28/2020   ALT 18 05/31/2021   AST 23 05/31/2021   NA 143 05/31/2021   K 4.4 05/31/2021   CL 107 05/31/2021   CREATININE 1.01 05/31/2021   BUN 20 05/31/2021   CO2 28 05/31/2021   TSH 1.37 11/28/2020   HGBA1C 5.8 11/28/2020   MICROALBUR 1.8 12/23/2017    VAS [place] LE ART SEG MULTI (Segm&LE Reynauds)  Result  Date: 04/22/2021  LOWER EXTREMITY DOPPLER STUDY Patient Name:  Hailey Hunt  Date of Exam:   04/22/2021 Medical Rec #: [id number]         Accession #:    [id number] Date of Birth: 1946-07-27         Patient Gender: F Patient Age:   53Y Exam Location:  [facility] Procedure:      VAS [place] LOWER EXT ART SEG MULTI (SEGMENTALS & LE RAYNAUDS) Referring Phys: [id number] [place] --------------------------------------------------------------------------------  Indications: Patient has fibromuscular dysplasia (FMD) and complains of              intermittent foot cramping for 2 years. Patient denies claudication              and rest pain symptoms. High Risk Factors: No history  of smoking.  Comparison Study: None Performing Technologist: Salvadore Dom RVT  Examination Guidelines: A complete evaluation includes at minimum, Doppler waveform signals and systolic blood pressure reading at the level of bilateral brachial, anterior tibial, and posterior tibial arteries, when vessel segments are accessible. Bilateral testing is considered an integral part of a complete examination. Photoelectric Plethysmograph (PPG) waveforms and toe systolic pressure readings are included as required and additional duplex testing as needed. Limited examinations for reoccurring indications may be performed as noted.  ABI Findings: +---------+------------------+-----+---------+--------+ Right    Rt Pressure (mmHg)IndexWaveform Comment  +---------+------------------+-----+---------+--------+ Brachial 134                                      +---------+------------------+-----+---------+--------+ CFA                             triphasic         +---------+------------------+-----+---------+--------+ Popliteal                       triphasic         +---------+------------------+-----+---------+--------+ ATA      159               1.19 triphasic         +---------+------------------+-----+---------+--------+ PTA       171               1.28 triphasic         +---------+------------------+-----+---------+--------+ PERO     158               1.18 triphasic         +---------+------------------+-----+---------+--------+ Great Toe120               0.90 Normal            +---------+------------------+-----+---------+--------+ +---------+------------------+-----+---------+-------+ Left     Lt Pressure (mmHg)IndexWaveform Comment +---------+------------------+-----+---------+-------+ Brachial 133                                     +---------+------------------+-----+---------+-------+ CFA                             triphasic        +---------+------------------+-----+---------+-------+ Popliteal                       triphasic        +---------+------------------+-----+---------+-------+ ATA      154               1.15 triphasic        +---------+------------------+-----+---------+-------+ PTA      164               1.22 triphasic        +---------+------------------+-----+---------+-------+ PERO     144               1.07 triphasic        +---------+------------------+-----+---------+-------+ Great Toe92                0.69 Normal           +---------+------------------+-----+---------+-------+ +-------+-----------+-----------+------------+------------+ ABI/TBIToday's ABIToday's TBIPrevious ABIPrevious TBI +-------+-----------+-----------+------------+------------+ Right  1.28       .90                                 +-------+-----------+-----------+------------+------------+  Left   1.22       .69                                 +-------+-----------+-----------+------------+------------+   Summary: Right: Resting right ankle-brachial index is within normal range. No evidence of significant right lower extremity arterial disease. The right toe-brachial index is normal. Left: Resting left ankle-brachial index is within normal range. No evidence of  significant left lower extremity arterial disease. The left toe-brachial index is abnormal.  *See table(s) above for measurements and observations.  Electronically signed by Quay Burow MD on 04/22/2021 at 5:42:30 PM.    Final     Assessment & Plan:   Problem List Items Addressed This Visit       Unprioritized   Osteoporosis (Chronic)    Managed wit hProlia,  Calcium and vitamin D by Dr Cruzita Lederer.  Most recent DEXA noted an increase in BMD.  She has no history of fractures      Pernicious anemia - Primary    There is no record of IF antibody.  Continue parenteral supplementation until IF ab can be checked       Relevant Orders   Vitamin B12 (Completed)   Vitamin D deficiency   Relevant Orders   PTH, Intact and Calcium   VITAMIN D 25 Hydroxy (Vit-D Deficiency, Fractures) (Completed)   Nocturnal muscle cramps    Recommending a trial of dill pickle juice , one ounce daily as preventive measure, as long as it does not elevate blood pressure       Prediabetes    She has been following a careful diet which unfortunately results in weight loss, which she does not need as she is underweight.   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 Renal function stable, no changes today.  Lab Results  Component Value Date   CREATININE 1.01 05/31/2021   Lab Results  Component Value Date   NA 143 05/31/2021   K 4.4 05/31/2021   CL 107 05/31/2021   CO2 28 05/31/2021         Mild protein-calorie malnutrition ([place])    Secondary to low glycemic index diet introduced to address prediabetes.  Weight is stable.       Bilateral hand pain    /CTS ruled out with EMG/[facility] studies August 2022/       Other Visit Diagnoses     Allergic reaction, sequela       Relevant Orders   IgE   Leukocytosis,  unspecified type       Relevant Orders   CBC with Differential/Platelet (Completed)   IgE       I am having Alajiah B. Jankovich "Jocelyn Lamer" maintain her SYRINGE 3CC/27GX1-1/4", cholecalciferol, Cyanocobalamin (VITAMIN B 12 PO), ASPIRIN 81 PO, traMADol, denosumab, omeprazole, tretinoin, cyanocobalamin, Tyrvaya, prednisoLONE acetate, Synthroid, montelukast, predniSONE, and gabapentin.  No orders of the defined types were placed in this encounter.   There are no discontinued medications.  Follow-up: Return in about 6 months (around 11/28/2021).   Crecencio Mc, MD

## 2021-06-02 NOTE — Assessment & Plan Note (Signed)
Secondary to low glycemic index diet introduced to address prediabetes.  Weight is stable.

## 2021-06-02 NOTE — Assessment & Plan Note (Signed)
/  CTS ruled out with EMG/[facility] studies August 2022/

## 2021-06-02 NOTE — Assessment & Plan Note (Signed)
Recommending a trial of dill pickle juice , one ounce daily as preventive measure, as long as it does not elevate blood pressure

## 2021-06-02 NOTE — Assessment & Plan Note (Signed)
Well controlled on current regimen. Renal function stable, no changes today.  Lab Results  Component Value Date   CREATININE 1.01 05/31/2021   Lab Results  Component Value Date   NA 143 05/31/2021   K 4.4 05/31/2021   CL 107 05/31/2021   CO2 28 05/31/2021

## 2021-06-02 NOTE — Assessment & Plan Note (Signed)
There is no record of IF antibody.  Continue parenteral supplementation until IF ab can be checked

## 2021-06-02 NOTE — Assessment & Plan Note (Signed)
She has been following a careful diet which unfortunately results in weight loss, which she does not need as she is underweight.   Lab Results  Component Value Date   HGBA1C 5.8 11/28/2020

## 2021-06-02 NOTE — Assessment & Plan Note (Signed)
Managed wit hProlia,  Calcium and vitamin D by Dr Cruzita Lederer.  Most recent DEXA noted an increase in BMD.  She has no history of fractures

## 2021-06-03 LAB — PTH, INTACT AND CALCIUM
Calcium: 9.8 mg/dL (ref 8.6–10.4)
PTH: 38 pg/mL (ref 16–77)

## 2021-06-03 LAB — IGE: IgE (Immunoglobulin E), Serum: 16 kU/L (ref <=114)

## 2021-06-07 DIAGNOSIS — Z23 Encounter for immunization: Secondary | ICD-10-CM | POA: Diagnosis not present

## 2021-06-11 ENCOUNTER — Other Ambulatory Visit: Payer: Self-pay | Admitting: Internal Medicine

## 2021-06-25 DIAGNOSIS — I701 Atherosclerosis of renal artery: Secondary | ICD-10-CM | POA: Diagnosis not present

## 2021-06-25 DIAGNOSIS — I773 Arterial fibromuscular dysplasia: Secondary | ICD-10-CM | POA: Diagnosis not present

## 2021-06-25 DIAGNOSIS — R252 Cramp and spasm: Secondary | ICD-10-CM | POA: Diagnosis not present

## 2021-06-25 DIAGNOSIS — N281 Cyst of kidney, acquired: Secondary | ICD-10-CM | POA: Diagnosis not present

## 2021-07-18 ENCOUNTER — Encounter: Payer: Self-pay | Admitting: Internal Medicine

## 2021-07-18 ENCOUNTER — Ambulatory Visit (INDEPENDENT_AMBULATORY_CARE_PROVIDER_SITE_OTHER): Payer: Medicare Other | Admitting: Internal Medicine

## 2021-07-18 ENCOUNTER — Other Ambulatory Visit: Payer: Self-pay

## 2021-07-18 VITALS — BP 118/72 | HR 77 | Ht 63.0 in | Wt 96.6 lb

## 2021-07-18 DIAGNOSIS — E890 Postprocedural hypothyroidism: Secondary | ICD-10-CM | POA: Diagnosis not present

## 2021-07-18 DIAGNOSIS — I150 Renovascular hypertension: Secondary | ICD-10-CM | POA: Diagnosis not present

## 2021-07-18 DIAGNOSIS — I701 Atherosclerosis of renal artery: Secondary | ICD-10-CM

## 2021-07-18 DIAGNOSIS — R7303 Prediabetes: Secondary | ICD-10-CM | POA: Diagnosis not present

## 2021-07-18 DIAGNOSIS — M810 Age-related osteoporosis without current pathological fracture: Secondary | ICD-10-CM | POA: Diagnosis not present

## 2021-07-18 DIAGNOSIS — M8889 Osteitis deformans of multiple sites: Secondary | ICD-10-CM

## 2021-07-18 LAB — POCT GLYCOSYLATED HEMOGLOBIN (HGB A1C): Hemoglobin A1C: 5.4 % (ref 4.0–5.6)

## 2021-07-18 NOTE — Patient Instructions (Addendum)
Please return for labs in December.  Please continue Synthroid 50 mcg daily.  Take the thyroid hormone every day, with water, at least 30 minutes before breakfast, separated by at least 4 hours from: - acid reflux medications - calcium - iron - multivitamins  Please come back for a follow-up appointment in 1 year.

## 2021-07-18 NOTE — Progress Notes (Signed)
Patient ID: Hailey Hunt, female   DOB: May 09, 1946, 75 y.o.   MRN: [id number]  This visit occurred during the SARS-CoV-2 public health emergency.  Safety protocols were in place, including screening questions prior to the visit, additional usage of staff PPE, and extensive cleaning of exam room while observing appropriate contact time as indicated for disinfecting solutions.   HPI  Hailey Hunt is a 75 y.o.-year-old female, returning for follow-up for Paget ds, osteopenia/osteoporosis, postablative hypothyroidism, prediabetes.  Last visit 1 year ago.  Interim history: No falls or fractures since last visit.  No bone pain. She walks 5 mi every day - in am. No hip pain or other discomfort.  Reviewed history: She had a CTA abd/pelvis on 06/2015:incidental lesion:  Diffuse patchy sclerosis and cortical thickening of the left iliac bone, favored to represent Paget's disease.  She has had an anteverted L femur since her 24s. She has had pain in that his ever since.    Bone scan (01/02/2016): FINDINGS: There is asymmetric increased uptake throughout the left iliac bone. The appearance and distribution is compatible with Paget's disease. Increased uptake in the distribution of the nasal bone is also noted, nonspecific. This could be seen with Paget's disease, fibrous dysplasia or chronic sinusitis. No findings identified to suggest bone metastases. Physiologic uptake is noted within the kidneys and urinary bladder.   IMPRESSION: 1. Increased uptake within the left iliac bone compatible with Paget's disease. 2. Central area of increased uptake in the region of the nasal bone which may also reflect Paget's disease of the skull.   Per alkaline phosphatase levels are in the normal range: Lab Results  Component Value Date   ALKPHOS 60 05/31/2021   ALKPHOS 55 11/28/2020   ALKPHOS 62 05/25/2020   ALKPHOS 78 05/23/2019   ALKPHOS 80 11/26/2018   ALKPHOS 71 11/26/2018   ALKPHOS 130 (H)  08/17/2018   ALKPHOS 78 05/10/2018   ALKPHOS 71 12/23/2017   ALKPHOS 82 07/13/2017  - Osteopenia/osteoporosis:  Reviewed previous bone density scan reports:  12/30/2020 ([place]) Lumbar spine L1-L4 Femoral neck (FN) 33% distal radius   -0.1 RFN: -2.1 LFN: -2.1 n/a  Change in BMD from previous DXA test (%) +4.2%* +4.0% n/a  (*) statistically significant  Date L1-L4 T score FN T scores FRAX score  12/21/2018 ([facility]) -0.5 (-1.6%) RFN: -2.4 LFN: -2.2  (-3%) 10-year MOF: 10.8% 10-year hip fracture risk: 3%  12/16/2016 ([place]) -0.40 RFN:-2.2 LFN:-2.1   01/22/2012 ([facility])  -0.60 LFN: -0.66     She had a traumatic vertebral fracture as a child.  She has a history of dizziness, but not recently.  Previous osteoporosis treatments: - Actonel for 2 years. This was apparently stopped due to recurrent kidney stones -last episode 2010.  She did not feel good on this (stomach upset, weakness, fatigue). - Prolia - 08/10/2019, 02/08/2020, 08/30/2020, 03/08/2021- no SEs  Latest vitamin D levels have been normal, she previously had a low level in 2013: Lab Results  Component Value Date   VD25OH 43.19 05/31/2021   VD25OH 52.64 11/28/2020   VD25OH 38.5 07/18/2020   VD25OH 55.06 12/30/2018   VD25OH 60.99 02/02/2018   VD25OH 55.42 07/13/2017   VD25OH 49.37 12/12/2016   VD25OH 49.43 11/19/2015   VD25OH 46.46 08/14/2014   VD25OH 62 08/29/2013   She has been on 1000 units vitamin D daily for years - now in a MVI.  She continues to walk. In the past, D\during this walks, she talks on the telephone with  her sister in [place], who is also walking.  She also has an apple watch and completes all the circles every day.  She had surgical menopause at 75 years old.   Pt does not have a FH of osteoporosis.  No history of hypo or hypercalcemia or hyperparathyroidism: Lab Results  Component Value Date   PTH 38 05/31/2021   CALCIUM 9.7 05/31/2021   CALCIUM 9.8 05/31/2021   CALCIUM 9.4 11/28/2020    CALCIUM 9.3 05/25/2020   CALCIUM 9.8 07/01/2019   CALCIUM 9.3 05/23/2019   CALCIUM 9.7 12/30/2018   CALCIUM 9.3 11/26/2018   CALCIUM 9.8 08/17/2018   CALCIUM 10.2 05/10/2018   She has a history of Graves' disease status post RAI treatment in the 47s.  She developed post ablative hypothyroidism.  Pt is on Synthroid 50 mcg daily, taken: - in am (at 4:30 am - then goes back to sleep) - fasting - at least 30 min from b'fast - no Ca, Fe, MVI, PPIs - not on Biotin  On vitamin B12 and D.  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; latest BUN/creatinine: Lab Results  Component Value Date   BUN 20 05/31/2021   CREATININE 1.01 05/31/2021   Prediabetes: Reviewed her HbA1c levels: Lab Results  Component Value Date   HGBA1C 5.8 11/28/2020   HGBA1C 6.0 05/25/2020   HGBA1C 5.9 05/23/2019   HGBA1C 5.8 11/26/2018   HGBA1C 5.8 08/17/2018   HGBA1C 6.2 05/10/2018   HGBA1C 5.3 12/21/2017   HGBA1C 5.9 12/20/2017   HGBA1C 5.3 12/19/2017   HGBA1C 5.3 12/19/2017   Investigation for type 1 diabetes was negative: Component     Latest Ref Rng & Units 06/30/2018  C-Peptide     0.80 - 3.85 ng/mL 2.45  Glucose, Plasma     65 - 99 mg/dL 93  ZNT8 Antibodies      negative  Glutamic Acid Decarb Ab     <5 IU/mL <5  Islet Cell Ab     Neg:<1:1 Negative    ROS: + See HPI  I reviewed pt's medications, allergies, PMH, social hx, family hx, and changes were documented in the history of present illness. Otherwise, unchanged from my initial visit note.  Past Medical History:  Diagnosis Date   Aberrant right subclavian artery    Cataract    surgery   Clotting disorder (HCC)    hx of bleeding after vaginal delivery, dr told her not to have any surgeries that are necessary   Dysplasia, artery, fibromuscular, renal ([facility])     Fibromuscular dysplasia ([place])    Fibromuscular dysplasia of renal artery ([place])    managed by Dr. 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[place])    Dr Mervyn Skeeters   Paget's disease of the bone    Pernicious anemia    Thyroid disease    Urinary incontinence, mixed    resolved with removal of ained sutures in bladder    Past Surgical History:  Procedure Laterality Date   BILATERAL SALPINGOOPHORECTOMY     CATARACT EXTRACTION  Sep 10, 2011, Jan 13,2013   COLONOSCOPY  RENAL BIOPSY     TONSILLECTOMY     VAGINAL DELIVERY     x2   VAGINAL HYSTERECTOMY     WISDOM TOOTH EXTRACTION     Social History   Socioeconomic History   Marital status: Married    Spouse name: Not on file   Number of children: 2   Years of education: Not on file   Highest education level: Not on file  Occupational History   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  Tobacco Use   Smoking status: Never   Smokeless tobacco: Never  Vaping Use   Vaping Use: Never used  Substance and Sexual Activity   Alcohol use: Yes    Comment: once a year   Drug use: No   Sexual activity: Not Currently  Other Topics Concern   Not on file  Social History Narrative   Not on file   Social Determinants of Health   Financial Resource Strain: Low Risk    Difficulty of Paying Living Expenses: Not hard at all  Food Insecurity: No Food Insecurity   Worried About Charity fundraiser in the Last Year: Never true   [place] in the Last Year: Never true  Transportation Needs: No Transportation Needs   Lack of Transportation (Medical): No   Lack of Transportation (Non-Medical): No  Physical Activity: Sufficiently Active   Days of Exercise per Week: 7 days   Minutes of Exercise per Session: 60 min  Stress: No Stress Concern Present   Feeling of Stress : Not at all  Social Connections: Unknown   Frequency of  Communication with Friends and Family: Not on file   Frequency of Social Gatherings with Friends and Family: Not on file   Attends Religious Services: Not on Electrical engineer or Organizations: Not on file   Attends Archivist Meetings: Not on file   Marital Status: Married  Human resources officer Violence: Not At Risk   Fear of Current or Ex-Partner: No   Emotionally Abused: No   Physically Abused: No   Sexually Abused: No   Current Outpatient Medications on File Prior to Visit  Medication Sig Dispense Refill   ASPIRIN 81 PO      cholecalciferol (VITAMIN D) 1000 UNITS tablet Take 1,000 Units by mouth daily.     cyanocobalamin (,VITAMIN B-12,) 1000 MCG/ML injection INJECT AS DIRECTED 1 mL 11   Cyanocobalamin (VITAMIN B 12 PO) Take by mouth.     denosumab (PROLIA) 60 MG/ML SOSY injection      gabapentin (NEURONTIN) 100 MG capsule Take two tablets daily at bedtime (total 200MG ) 60 capsule 3   montelukast (SINGULAIR) 10 MG tablet Take 1 tablet (10 mg total) by mouth at bedtime. 30 tablet 3   omeprazole (PRILOSEC) 40 MG capsule Take 1 capsule (40 mg total) by mouth daily. 30 capsule 3   prednisoLONE acetate (PRED FORTE) 1 % ophthalmic suspension      predniSONE (DELTASONE) 10 MG tablet Take 4 pills once daily by mouth for 3 days, then 3 pills daily for 3 days, then 2 pills daily for 3 days then 1 pill daily for 3 days then stop 30 tablet 0   SYNTHROID 50 MCG tablet TAKE ONE TABLET BY MOUTH DAILY ON AN EMPTY STOMACH WITH A FULL GLASS OF WATER AT LEAST 30-60 MINUTES BEFORE BREAKFAST 90 tablet 1   Syringe/Needle, Disp, (SYRINGE 3CC/27GX1-1/4") 27G X 1-1/4" 3 ML MISC For B12 injections 100 each 0  traMADol (ULTRAM) 50 MG tablet Take 1 tablet (50 mg total) by mouth every 8 (eight) hours as needed. 30 tablet 3   tretinoin (RETIN-A) 0.025 % cream APPLY TOPICALLY EVERY NIGHT AT BEDTIME 45 g 1   Varenicline Tartrate (TYRVAYA) 0.03 MG/ACT SOLN      No current facility-administered  medications on file prior to visit.   Allergies  Allergen Reactions   Voltaren [Diclofenac] Hypertension   Adhesive [Tape]    Demerol     HYPOTENSION   Macrodantin    Nsaids Hypertension   Voltaren [Diclofenac Sodium] Other (See Comments)    FMD, increased bp   Codeine Nausea And Vomiting and Rash   Nickel Rash   Penicillins Rash   Sulfa Antibiotics Rash    Fever   Family History  Problem Relation Age of Onset   Anuerysm Mother    COPD Mother    Hypertension Mother    Heart disease Mother    Heart failure Mother    Cancer Father        lung CA   Heart disease Father    Heart attack Father    Heart disease Brother        viral cardiomyopathy, cocaine use   Drug abuse Brother    Cancer Maternal Aunt        esophageal and live rCA,  hisotory of  ETOH and tobacco   Anuerysm Maternal Grandfather    Cancer Paternal Grandmother        brain tumor   Diabetes Sister    Colon cancer Paternal Uncle    PE: BP 118/72 (BP Location: Left Arm, Patient Position: Sitting, Cuff Size: Normal)   Pulse 77   Ht 5\' 3"  (1.6 m)   Wt 96 lb 9.6 oz (43.8 kg)   SpO2 97%   BMI 17.11 kg/m  Body mass index is 17.11 kg/m. Wt Readings from Last 3 Encounters:  07/18/21 96 lb 9.6 oz (43.8 kg)  05/31/21 94 lb 3.2 oz (42.7 kg)  05/29/21 95 lb (43.1 kg)   Constitutional: thin, in NAD Eyes: PERRLA, EOMI, no exophthalmos ENT: moist mucous membranes, no thyromegaly, no cervical lymphadenopathy Cardiovascular: RRR, No MRG Respiratory: CTA B Gastrointestinal: abdomen soft, NT, ND, BS+ Musculoskeletal: no deformities, strength intact in all 4 Skin: moist, warm, no rashes Neurological: + tremor with outstretched hands (chronic), DTR normal in all 4  Assessment: 1. Paget disease  2.  Clinical osteoporosis  3. Postablative hypothyroidism - RAI tx for Graves ds. In 1980s  4.  Prediabetes  Plan: 1. Paget ds. -Stable, without new pain or warmth at the site of her pagetoid lesions -She had a  lesion in the left hip observed incidentally on pelvic CTA at [facility].  Her FMD [facility].  This was again noticed on bone scan from 2017, along with a nasal focus -We discussed that Paget's disease lesions do not need to be treated unless they are painful, contiguous with a joint, or in weightbearing joints.  Also, consideration for treatment should be given in case alkaline phosphatase increases.  Her levels have been normal, including at last check in 05/2021, when this was well within the normal range, at 60 -We discussed that severe complications including bone cancer, fractures, heart failure, are very rare nowadays. -She wanted to avoid bisphosphonates due to previous intolerance -She tolerates Prolia well -we will continue this for now -next injection is due in 08/2021  2.  Clinical osteoporosis -In 2020, bone density scan showed  slightly worse bone density scores at the femoral necks-however, this did not reach statistical significance.  However, she had another bone density scan in 12/2020 and this showed significant improvement of BMD at the level of the spine and also improvement at the hips, but this did not reach statistical significance. -Her FRAX scores revealed a high risk for fracture in 2020, confirming clinical osteoporosis, for which, treatment was indicated. -She agreed to start Prolia  - started 07/2019.  She had 4 doses. -Tolerates Prolia well, without jaw, hip or thigh pain -She continues on 1000 units vitamin D daily -Vitamin D level was normal at last check 05/2021  3. Postablative hypothyroidism - latest thyroid labs reviewed with pt. >> normal: Lab Results  Component Value Date   TSH 1.37 11/28/2020  - she continues on LT4 50 mcg daily - pt feels good on this dose. - we discussed about taking the thyroid hormone every day, with water, >30 minutes before breakfast, separated by >4 hours from acid reflux medications, calcium, iron, multivitamins. Pt. is taking it  correctly. -We will recheck them when she comes for her Prolia inj in 08/2021  4.  Prediabetes -Her HbA1c levels fluctuate between the normal range and the prediabetic range -Before last visit, she adjusted her diet by cutting out fast foods, concentrated sweets, and starches.  She lost weight afterwards and this prompted an investigation for type 1 diabetes, which returns negative. -Before last visit, an HbA1c was 6.0%, but in 11/2020, and is decreased to 5.8%. -at today's visit, it improved to 5.4% -No intervention needed for now; continue exercise  Orders Placed This Encounter  Procedures   TSH   T4, free   POCT glycosylated hemoglobin (Hb A1C)   Philemon Kingdom, MD PhD [facility]

## 2021-08-06 NOTE — Telephone Encounter (Addendum)
  Pt ready for scheduling on or after 09/08/21 IF scheduled AFTER 09/28/21, benefits will need to be re-run prior to Prolia inj.   Out-of-pocket cost due at time of visit: $0.00  Primary: Medicare Prolia co-insurance: 20% (approximately $270) Admin fee co-insurance: 20% (approximately $25)  Secondary: BCBS Medicare Supp Prolia co-insurance: Covers Medicare Part B co-insurance and deductible.  Admin fee co-insurance: Covers Medicare Part B co-insurance and deductible.   Deductible: $233 of $233 met  Prior Auth: not required PA# Valid:   ** This summary of benefits is an estimation of the patient's out-of-pocket cost. Exact cost may vary based on individual plan coverage.

## 2021-09-11 ENCOUNTER — Other Ambulatory Visit: Payer: Self-pay

## 2021-09-11 ENCOUNTER — Other Ambulatory Visit (INDEPENDENT_AMBULATORY_CARE_PROVIDER_SITE_OTHER): Payer: Medicare Other

## 2021-09-11 ENCOUNTER — Ambulatory Visit (INDEPENDENT_AMBULATORY_CARE_PROVIDER_SITE_OTHER): Payer: Medicare Other

## 2021-09-11 DIAGNOSIS — E890 Postprocedural hypothyroidism: Secondary | ICD-10-CM | POA: Diagnosis not present

## 2021-09-11 DIAGNOSIS — M810 Age-related osteoporosis without current pathological fracture: Secondary | ICD-10-CM

## 2021-09-11 LAB — TSH: TSH: 1.33 u[IU]/mL (ref 0.35–5.50)

## 2021-09-11 LAB — T4, FREE: Free T4: 0.96 ng/dL (ref 0.60–1.60)

## 2021-09-11 MED ORDER — DENOSUMAB 60 MG/ML ~~LOC~~ SOSY
60.0000 mg | PREFILLED_SYRINGE | Freq: Once | SUBCUTANEOUS | Status: AC
  Administered 2021-09-11: 10:00:00 60 mg via SUBCUTANEOUS

## 2021-09-11 NOTE — Progress Notes (Signed)
Prolia injection administered left arm sub q. Patient tolerated well.

## 2021-09-14 NOTE — Telephone Encounter (Signed)
Last Prolia inj 09/11/21 Next Prolia inj due 09/12/22

## 2021-10-22 ENCOUNTER — Encounter: Payer: Self-pay | Admitting: Internal Medicine

## 2021-10-22 ENCOUNTER — Ambulatory Visit (INDEPENDENT_AMBULATORY_CARE_PROVIDER_SITE_OTHER): Payer: Medicare Other | Admitting: Internal Medicine

## 2021-10-22 ENCOUNTER — Other Ambulatory Visit: Payer: Self-pay

## 2021-10-22 DIAGNOSIS — R5383 Other fatigue: Secondary | ICD-10-CM | POA: Diagnosis not present

## 2021-10-22 NOTE — Patient Instructions (Signed)
I'll reach out to [facility] [place] about starting Whitney on sertraline or another alternative

## 2021-10-22 NOTE — Assessment & Plan Note (Signed)
Secondary to recurrent violent outbursts of her daughter Loree Fee who has become increasingly delusional and obsessive. .  A total of 40 minutes of face to face time was spent with patient more than half of which was spent in counselling and coordination of care .  Will discuss the safety of adding an SSRI (zoloft)  toi daughter Whitney's medication  Regimen  with her neurologist

## 2021-10-22 NOTE — Progress Notes (Signed)
Subjective:  Patient ID: Hailey Hunt, female    DOB: Jun 12, 1946  Age: 76 y.o. MRN: [id number]  CC: The encounter diagnosis was Caregiver with fatigue.   This visit occurred during the SARS-CoV-2 public health emergency.  Safety protocols were in place, including screening questions prior to the visit, additional usage of staff PPE, and extensive cleaning of exam room while observing appropriate contact time as indicated for disinfecting solutions.    HPI Hailey Hunt presents for discussion of caregiver fatigue.  Chief Complaint  Patient presents with   Follow-up    Follow up on medications    Daughter Hailey Hunt is a 76 yr old female with a congenital brain malformation and seizure disorder  who has developed more frequent and violent behavioral disturbances. Occurring about once a week.  Has become delusional intermittently  (people in the back yard,  denies  having a period when actively bleeding,  having conversations on her cell phone but nobody on the other line, confusing a close family friend  with a hired Engineer, structural , insists that she has a red car in [place]).   Since her COVID vaccination last year , which resulted in significant weight loss and encephalopathy,  she spends most of the day sitting in her study looking out the window at the neighbors back yard .  She has reported that the neighbors are "shredding people in their shredder."  Family is very restrictive about TV content.  Since COVID she avoids watching TV or using the computer  but will use her cell phone obsessively  to search topics on the internet .  She will stand and watch her Apple watch charge for 2 hours. She has renamed /re identified her childhood teddy bear as a "groom"  and now repeatedly requests to have  a boyfriend .  She keeps herself well groomed,  appetite is good,    Most episodes of agitation occur when she is asked to do something she doesn't want to do .  When she gets frustrated,  she becomes  enraged and physically violent, to herself and to her mother .  Seizures are occurring less often,  but still occurring, and she refuses to believe she is having a seizure when she clearly is having one.    Outpatient Medications Prior to Visit  Medication Sig Dispense Refill   ASPIRIN 81 PO      cholecalciferol (VITAMIN D) 1000 UNITS tablet Take 1,000 Units by mouth daily.     denosumab (PROLIA) 60 MG/ML SOSY injection      Multiple Vitamins-Minerals (CENTRUM SILVER 50+WOMEN PO) Take by mouth.     omeprazole (PRILOSEC) 40 MG capsule Take 1 capsule (40 mg total) by mouth daily. (Patient taking differently: Take 40 mg by mouth as needed.) 30 capsule 3   SYNTHROID 50 MCG tablet TAKE ONE TABLET BY MOUTH DAILY ON AN EMPTY STOMACH WITH A FULL GLASS OF WATER AT LEAST 30-60 MINUTES BEFORE BREAKFAST 90 tablet 1   Syringe/Needle, Disp, (SYRINGE 3CC/27GX1-1/4") 27G X 1-1/4" 3 ML MISC For B12 injections 100 each 0   traMADol (ULTRAM) 50 MG tablet Take 1 tablet (50 mg total) by mouth every 8 (eight) hours as needed. 30 tablet 3   tretinoin (RETIN-A) 0.025 % cream APPLY TOPICALLY EVERY NIGHT AT BEDTIME 45 g 1   gabapentin (NEURONTIN) 100 MG capsule Take two tablets daily at bedtime (total 200MG ) (Patient not taking: Reported on 10/22/2021) 60 capsule 3   montelukast (SINGULAIR) 10 MG tablet  Take 1 tablet (10 mg total) by mouth at bedtime. (Patient not taking: Reported on 10/22/2021) 30 tablet 3   prednisoLONE acetate (PRED FORTE) 1 % ophthalmic suspension  (Patient not taking: Reported on 10/22/2021)     Varenicline Tartrate (TYRVAYA) 0.03 MG/ACT SOLN  (Patient not taking: Reported on 10/22/2021)     predniSONE (DELTASONE) 10 MG tablet Take 4 pills once daily by mouth for 3 days, then 3 pills daily for 3 days, then 2 pills daily for 3 days then 1 pill daily for 3 days then stop (Patient not taking: Reported on 10/22/2021) 30 tablet 0   No facility-administered medications prior to visit.    Review of  Systems;  Patient denies headache, fevers, malaise, unintentional weight loss, skin rash, eye pain, sinus congestion and sinus pain, sore throat, dysphagia,  hemoptysis , cough, dyspnea, wheezing, chest pain, palpitations, orthopnea, edema, abdominal pain, nausea, melena, diarrhea, constipation, flank pain, dysuria, hematuria, urinary  Frequency, nocturia, numbness, tingling, seizures,  Focal weakness, Loss of consciousness,  Tremor, insomnia, depression, anxiety, and suicidal ideation.      Objective:  BP 122/70 (BP Location: Left Arm, Patient Position: Sitting, Cuff Size: Normal)    Pulse 61    Temp 97.7 F (36.5 C) (Oral)    Ht 5\' 3"  (1.6 m)    Wt 96 lb 9.6 oz (43.8 kg)    SpO2 99%    BMI 17.11 kg/m   BP Readings from Last 3 Encounters:  10/22/21 122/70  07/18/21 118/72  05/31/21 108/70    Wt Readings from Last 3 Encounters:  10/22/21 96 lb 9.6 oz (43.8 kg)  07/18/21 96 lb 9.6 oz (43.8 kg)  05/31/21 94 lb 3.2 oz (42.7 kg)    General appearance: alert, cooperative and appears stated age Ears: normal TM's and external ear canals both ears Throat: lips, mucosa, and tongue normal; teeth and gums normal Neck: no adenopathy, no carotid bruit, supple, symmetrical, trachea midline and thyroid not enlarged, symmetric, no tenderness/mass/nodules Back: symmetric, no curvature. ROM normal. No CVA tenderness. Lungs: clear to auscultation bilaterally Heart: regular rate and rhythm, S1, S2 normal, no murmur, click, rub or gallop Abdomen: soft, non-tender; bowel sounds normal; no masses,  no organomegaly Pulses: 2+ and symmetric Skin: Skin color, texture, turgor normal. No rashes or lesions Lymph nodes: Cervical, supraclavicular, and axillary nodes normal.  Lab Results  Component Value Date   HGBA1C 5.4 07/18/2021   HGBA1C 5.8 11/28/2020   HGBA1C 6.0 05/25/2020    Lab Results  Component Value Date   CREATININE 1.01 05/31/2021   CREATININE 0.89 11/28/2020   CREATININE 0.98 05/25/2020     Lab Results  Component Value Date   WBC 5.3 05/31/2021   HGB 11.8 (L) 05/31/2021   HCT 36.6 05/31/2021   PLT 231.0 05/31/2021   GLUCOSE 85 05/31/2021   CHOL 198 11/28/2020   TRIG 62.0 11/28/2020   HDL 87.10 11/28/2020   LDLDIRECT 92.0 11/19/2015   LDLCALC 98 11/28/2020   ALT 18 05/31/2021   AST 23 05/31/2021   NA 143 05/31/2021   K 4.4 05/31/2021   CL 107 05/31/2021   CREATININE 1.01 05/31/2021   BUN 20 05/31/2021   CO2 28 05/31/2021   TSH 1.33 09/11/2021   HGBA1C 5.4 07/18/2021   MICROALBUR 1.8 12/23/2017     Assessment & Plan:   Problem List Items Addressed This Visit     Caregiver with fatigue    Secondary to recurrent violent outbursts of her daughter Hailey Hunt who has  become increasingly delusional and obsessive. .  A total of 40 minutes of face to face time was spent with patient more than half of which was spent in counselling and coordination of care .  Will discuss the safety of adding an SSRI (zoloft)  toi daughter Whitney's medication  Regimen  with her neurologist        I spent 40 minutes dedicated to the care of this patient on the date of this encounter to include pre-visit review of patient's medical history,  most recent imaging studies, Face-to-face time with the patient , and post visit ordering of testing and therapeutics.    Follow-up: No follow-ups on file.   Crecencio Mc, MD

## 2021-10-23 ENCOUNTER — Telehealth: Payer: Self-pay | Admitting: Internal Medicine

## 2021-10-29 ENCOUNTER — Ambulatory Visit: Payer: Medicare Other | Admitting: Internal Medicine

## 2021-11-28 ENCOUNTER — Encounter: Payer: Self-pay | Admitting: Internal Medicine

## 2021-11-28 ENCOUNTER — Ambulatory Visit (INDEPENDENT_AMBULATORY_CARE_PROVIDER_SITE_OTHER): Payer: Medicare Other | Admitting: Internal Medicine

## 2021-11-28 ENCOUNTER — Other Ambulatory Visit: Payer: Self-pay

## 2021-11-28 VITALS — BP 118/76 | HR 89 | Temp 96.5°F | Ht 60.0 in | Wt 94.6 lb

## 2021-11-28 DIAGNOSIS — E559 Vitamin D deficiency, unspecified: Secondary | ICD-10-CM | POA: Diagnosis not present

## 2021-11-28 DIAGNOSIS — E890 Postprocedural hypothyroidism: Secondary | ICD-10-CM | POA: Diagnosis not present

## 2021-11-28 DIAGNOSIS — M810 Age-related osteoporosis without current pathological fracture: Secondary | ICD-10-CM

## 2021-11-28 DIAGNOSIS — R7303 Prediabetes: Secondary | ICD-10-CM | POA: Diagnosis not present

## 2021-11-28 DIAGNOSIS — E785 Hyperlipidemia, unspecified: Secondary | ICD-10-CM | POA: Diagnosis not present

## 2021-11-28 DIAGNOSIS — I150 Renovascular hypertension: Secondary | ICD-10-CM

## 2021-11-28 DIAGNOSIS — R5383 Other fatigue: Secondary | ICD-10-CM

## 2021-11-28 DIAGNOSIS — I701 Atherosclerosis of renal artery: Secondary | ICD-10-CM

## 2021-11-28 DIAGNOSIS — R944 Abnormal results of kidney function studies: Secondary | ICD-10-CM | POA: Diagnosis not present

## 2021-11-28 DIAGNOSIS — D510 Vitamin B12 deficiency anemia due to intrinsic factor deficiency: Secondary | ICD-10-CM

## 2021-11-28 DIAGNOSIS — I773 Arterial fibromuscular dysplasia: Secondary | ICD-10-CM

## 2021-11-28 DIAGNOSIS — E538 Deficiency of other specified B group vitamins: Secondary | ICD-10-CM

## 2021-11-28 LAB — LIPID PANEL
Cholesterol: 195 mg/dL (ref 0–200)
HDL: 94.6 mg/dL (ref >39.00)
LDL Cholesterol: 87 mg/dL (ref 0–99)
NonHDL: 100.49
Total CHOL/HDL Ratio: 2
Triglycerides: 67 mg/dL (ref 0.0–149.0)
VLDL: 13.4 mg/dL (ref 0.0–40.0)

## 2021-11-28 LAB — CBC WITH DIFFERENTIAL/PLATELET
Basophils Absolute: 0 10*3/uL (ref 0.0–0.1)
Basophils Relative: 0.8 % (ref 0.0–3.0)
Eosinophils Absolute: 0.1 10*3/uL (ref 0.0–0.7)
Eosinophils Relative: 2.6 % (ref 0.0–5.0)
HCT: 36.3 % (ref 36.0–46.0)
Hemoglobin: 11.8 g/dL — ABNORMAL LOW (ref 12.0–15.0)
Lymphocytes Relative: 30.9 % (ref 12.0–46.0)
Lymphs Abs: 1.2 10*3/uL (ref 0.7–4.0)
MCHC: 32.6 g/dL (ref 30.0–36.0)
MCV: 90.9 fl (ref 78.0–100.0)
Monocytes Absolute: 0.3 10*3/uL (ref 0.1–1.0)
Monocytes Relative: 6.9 % (ref 3.0–12.0)
Neutro Abs: 2.3 10*3/uL (ref 1.4–7.7)
Neutrophils Relative %: 58.8 % (ref 43.0–77.0)
Platelets: 213 10*3/uL (ref 150.0–400.0)
RBC: 4 Mil/uL (ref 3.87–5.11)
RDW: 15.1 % (ref 11.5–15.5)
WBC: 3.9 10*3/uL — ABNORMAL LOW (ref 4.0–10.5)

## 2021-11-28 LAB — COMPREHENSIVE METABOLIC PANEL
ALT: 17 U/L (ref 0–35)
AST: 33 U/L (ref 0–37)
Albumin: 4.4 g/dL (ref 3.5–5.2)
Alkaline Phosphatase: 64 U/L (ref 39–117)
BUN: 15 mg/dL (ref 6–23)
CO2: 30 mEq/L (ref 19–32)
Calcium: 9.5 mg/dL (ref 8.4–10.5)
Chloride: 104 mEq/L (ref 96–112)
Creatinine, Ser: 1.09 mg/dL (ref 0.40–1.20)
GFR: 50.47 mL/min — ABNORMAL LOW (ref >60.00)
Glucose, Bld: 93 mg/dL (ref 70–99)
Potassium: 4.5 mEq/L (ref 3.5–5.1)
Sodium: 140 mEq/L (ref 135–145)
Total Bilirubin: 0.6 mg/dL (ref 0.2–1.2)
Total Protein: 6.3 g/dL (ref 6.0–8.3)

## 2021-11-28 LAB — VITAMIN B12: Vitamin B-12: >1504 pg/mL — ABNORMAL HIGH (ref 211–911)

## 2021-11-28 LAB — VITAMIN D 25 HYDROXY (VIT D DEFICIENCY, FRACTURES): VITD: 71.44 ng/mL (ref 30.00–100.00)

## 2021-11-28 MED ORDER — SYNTHROID 50 MCG PO TABS: ORAL_TABLET | ORAL | 1 refills | Status: DC

## 2021-11-28 MED ORDER — TRAMADOL HCL 50 MG PO TABS: 50.0000 mg | ORAL_TABLET | Freq: Three times a day (TID) | ORAL | 3 refills | Status: AC | PRN

## 2021-11-28 NOTE — Assessment & Plan Note (Signed)
T scores have improved by last DEXA 2022  On Prolia  ?

## 2021-11-28 NOTE — Progress Notes (Signed)
Subjective:  Patient ID: Hailey Hunt, female    DOB: 08-19-1946  Age: 76 y.o. MRN: [id number]  CC: The primary encounter diagnosis was Hypothyroidism following radioiodine therapy. 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   This visit occurred during the SARS-CoV-2 public health emergency.  Safety protocols were in place, including screening questions prior to the visit, additional usage of staff PPE, and extensive cleaning of exam room while observing appropriate contact time as indicated for disinfecting solutions.    HPI Hailey Hunt presents for follow up on multiple issues  Chief Complaint  Patient presents with   Follow-up   1)  Left lateral calf skin lesion first noted around 2 weeks ago.  Has not bled or itched. No history of trauma to area.  Durward Fortes to see dermatology . Not sure if appt needed  2) Caregiver stress  her daughter  Hailey Hunt is having less agitation and violent outbursts. since starting zoloft 50 mg  daily.Marland Kitchen However she remains obsessed with watching the neighbors behind them  when they are out in their back yard.  Her daughter has an upcoming appt with her new neurologist   3) Hypertension: patient checks blood pressure twice weekly at home.  Readings have been for the most part < 140/80 at rest . Patient is following a reduced salt diet s and is taking medications as prescribed  4) CKD :  she is avoiding NSAIDs and following up with nephrologist  every 6 months    5) B12 deficiency .  Has not taken  B12 injections in a while .  Not vegetarian.   6)  Osteoporosis: improving on Prolia by 2022 DEXA . Reviewed  calcium and vitamin D intake.  Exercising/walking,  no history of falls or fractures.      Outpatient Medications Prior to Visit  Medication Sig Dispense Refill   ASPIRIN 81 PO      cholecalciferol (VITAMIN D) 1000 UNITS tablet Take 1,000 Units by mouth daily.     denosumab (PROLIA) 60 MG/ML SOSY injection      montelukast (SINGULAIR) 10 MG tablet Take 1 tablet (10 mg total) by mouth at bedtime. 30 tablet 3   Multiple Vitamins-Minerals (CENTRUM SILVER 50+WOMEN PO) Take by mouth.     omeprazole (PRILOSEC) 40 MG capsule Take 1 capsule (40 mg total) by mouth daily. (Patient taking differently: Take 40 mg by mouth as needed.) 30 capsule 3   prednisoLONE acetate (PRED FORTE) 1 % ophthalmic suspension      Syringe/Needle, Disp, (SYRINGE 3CC/27GX1-1/4") 27G X 1-1/4" 3 ML MISC For B12 injections 100 each 0   tretinoin (RETIN-A) 0.025 % cream APPLY TOPICALLY EVERY NIGHT AT BEDTIME 45 g 1   Varenicline Tartrate (TYRVAYA) 0.03 MG/ACT SOLN      gabapentin (NEURONTIN) 100 MG capsule Take two tablets daily at bedtime (total 200MG) 60 capsule 3   SYNTHROID 50 MCG tablet TAKE ONE TABLET BY MOUTH DAILY ON AN EMPTY STOMACH WITH A FULL GLASS OF WATER AT LEAST 30-60 MINUTES BEFORE BREAKFAST 90 tablet 1   traMADol (ULTRAM) 50 MG tablet Take 1 tablet (50 mg total) by mouth every 8 (eight) hours as needed. 30 tablet 3   No facility-administered medications prior to visit.  Review of Systems;  Patient denies headache, fevers, malaise, unintentional weight loss, skin rash, eye pain, sinus congestion and sinus pain, sore throat, dysphagia,  hemoptysis , cough, dyspnea, wheezing, chest pain, palpitations, orthopnea, edema, abdominal pain, nausea, melena, diarrhea, constipation, flank pain, dysuria, hematuria, urinary  Frequency, nocturia, numbness, tingling, seizures,  Focal weakness, Loss of consciousness,  Tremor, insomnia, depression, anxiety, and suicidal ideation.      Objective:  BP 118/76 (BP Location:  Left Arm, Patient Position: Sitting, Cuff Size: Small)    Pulse 89    Temp (!) 96.5 F (35.8 C) (Oral)    Ht 5' (1.524 m)    Wt 94 lb 9.6 oz (42.9 kg)    SpO2 99%    BMI 18.48 kg/m   BP Readings from Last 3 Encounters:  11/28/21 118/76  10/22/21 122/70  07/18/21 118/72    Wt Readings from Last 3 Encounters:  11/28/21 94 lb 9.6 oz (42.9 kg)  10/22/21 96 lb 9.6 oz (43.8 kg)  07/18/21 96 lb 9.6 oz (43.8 kg)    General appearance: alert, cooperative and appears stated age Ears: normal TM's and external ear canals both ears Throat: lips, mucosa, and tongue normal; teeth and gums normal Neck: no adenopathy, no carotid bruit, supple, symmetrical, trachea midline and thyroid not enlarged, symmetric, no tenderness/mass/nodules Back: symmetric, no curvature. ROM normal. No CVA tenderness. Lungs: clear to auscultation bilaterally Heart: regular rate and rhythm, S1, S2 normal, no murmur, click, rub or gallop Abdomen: soft, non-tender; bowel sounds normal; no masses,  no organomegaly Pulses: 2+ and symmetric Skin: left lateral calf : Round and papular in shape,  papular crusty .  Lymph nodes: Cervical, supraclavicular, and axillary nodes normal.  Lab Results  Component Value Date   HGBA1C 5.4 07/18/2021   HGBA1C 5.8 11/28/2020   HGBA1C 6.0 05/25/2020    Lab Results  Component Value Date   CREATININE 1.09 11/28/2021   CREATININE 1.01 05/31/2021   CREATININE 0.89 11/28/2020    Lab Results  Component Value Date   WBC 3.9 (L) 11/28/2021   HGB 11.8 (L) 11/28/2021   HCT 36.3 11/28/2021   PLT 213.0 11/28/2021   GLUCOSE 93 11/28/2021   CHOL 195 11/28/2021   TRIG 67.0 11/28/2021   HDL 94.60 11/28/2021   LDLDIRECT 92.0 11/19/2015   LDLCALC 87 11/28/2021   ALT 17 11/28/2021   AST 33 11/28/2021   NA 140 11/28/2021   K 4.5 11/28/2021   CL 104 11/28/2021   CREATININE 1.09 11/28/2021   BUN 15 11/28/2021   CO2 30 11/28/2021   TSH 1.33 09/11/2021   HGBA1C 5.4 07/18/2021   MICROALBUR  1.8 12/23/2017    VAS [place] LE ART SEG MULTI (Segm&LE Reynauds)  Result Date: 04/22/2021  LOWER EXTREMITY DOPPLER STUDY Patient Name:  Hailey Hunt  Date of Exam:   04/22/2021 Medical Rec #: [id number]         Accession #:    [id number] Date of Birth: 1946/08/12         Patient Gender: F Patient Age:   84Y Exam Location:  [facility] Procedure:      VAS [place] LOWER EXT ART SEG MULTI (SEGMENTALS & LE RAYNAUDS) Referring Phys: [id number] [place] --------------------------------------------------------------------------------  Indications: Patient has fibromuscular dysplasia (FMD) and complains of              intermittent foot cramping for 2 years. Patient denies claudication              and  rest pain symptoms. High Risk Factors: No history of smoking.  Comparison Study: None Performing Technologist: Salvadore Dom RVT  Examination Guidelines: A complete evaluation includes at minimum, Doppler waveform signals and systolic blood pressure reading at the level of bilateral brachial, anterior tibial, and posterior tibial arteries, when vessel segments are accessible. Bilateral testing is considered an integral part of a complete examination. Photoelectric Plethysmograph (PPG) waveforms and toe systolic pressure readings are included as required and additional duplex testing as needed. Limited examinations for reoccurring indications may be performed as noted.  ABI Findings: +---------+------------------+-----+---------+--------+  Right     Rt Pressure (mmHg) Index Waveform  Comment   +---------+------------------+-----+---------+--------+  Brachial  134                                          +---------+------------------+-----+---------+--------+  CFA                                triphasic           +---------+------------------+-----+---------+--------+  Popliteal                          triphasic           +---------+------------------+-----+---------+--------+  ATA       159                1.19  triphasic            +---------+------------------+-----+---------+--------+  PTA       171                1.28  triphasic           +---------+------------------+-----+---------+--------+  PERO      158                1.18  triphasic           +---------+------------------+-----+---------+--------+  Great Toe 120                0.90  Normal              +---------+------------------+-----+---------+--------+ +---------+------------------+-----+---------+-------+  Left      Lt Pressure (mmHg) Index Waveform  Comment  +---------+------------------+-----+---------+-------+  Brachial  133                                         +---------+------------------+-----+---------+-------+  CFA                                triphasic          +---------+------------------+-----+---------+-------+  Popliteal                          triphasic          +---------+------------------+-----+---------+-------+  ATA       154                1.15  triphasic          +---------+------------------+-----+---------+-------+  PTA       164                1.22  triphasic          +---------+------------------+-----+---------+-------+  PERO      144                1.07  triphasic          +---------+------------------+-----+---------+-------+  Great Toe 92                 0.69  Normal             +---------+------------------+-----+---------+-------+ +-------+-----------+-----------+------------+------------+  ABI/TBI Today's ABI Today's TBI Previous ABI Previous TBI  +-------+-----------+-----------+------------+------------+  Right   1.28        .90                                    +-------+-----------+-----------+------------+------------+  Left    1.22        .69                                    +-------+-----------+-----------+------------+------------+   Summary: Right: Resting right ankle-brachial index is within normal range. No evidence of significant right lower extremity arterial disease. The right toe-brachial index is normal. Left: Resting  left ankle-brachial index is within normal range. No evidence of significant left lower extremity arterial disease. The left toe-brachial index is abnormal.  *See table(s) above for measurements and observations.  Electronically signed by Quay Burow MD on 04/22/2021 at 5:42:30 PM.    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 Renal function has declined slightly since September , no changes today.  Will check for proteinuria   Lab Results  Component Value Date   CREATININE 1.09 11/28/2021   Lab Results  Component Value Date   NA 140 11/28/2021   K 4.5 11/28/2021   CL 104 11/28/2021   CO2 30 11/28/2021         Caregiver with fatigue    Improved with Hailey Hunt's  Decreased agitation  .  Still not sleeping well , but she prefers to avoid sedating agents.        Decreased GFR    GFR has been 59 ml/min until today.  Will repeat when not fasting  Lab Results  Component Value Date   CREATININE 1.09 11/28/2021         Relevant Orders   Basic metabolic panel   Fibromuscular dysplasia (HCC)    BP is currently at goal without medication.  Continue annual imaging of head neck and abdomen and /semi annual follow up with [facility]        Hypothyroidism following radioiodine therapy - Primary   Relevant Medications   SYNTHROID 50 MCG tablet   Pernicious anemia    Normal  B 12 level today despite the fact that she has not been taking injections   Lab Results  Component Value Date   VITAMINB12 >1504 (H) 11/28/2021         Prediabetes   Relevant Orders   Comp Met (CMET) (Completed)   Vitamin D deficiency   Relevant Orders   VITAMIN D 25  Hydroxy (Vit-D Deficiency, Fractures) (Completed)   Other Visit Diagnoses     Dyslipidemia  (high LDL; low HDL)       Relevant Orders   Lipid Profile (Completed)   Other fatigue       Relevant Orders   CBC with Differential/Platelet (Completed)   B12 deficiency       Relevant Orders   Vitamin B12 (Completed)   Intrinsic Factor Antibodies (Completed)       I spent 30 minutes dedicated to the care of this patient on the date of this encounter to include pre-visit review of patient's medical history,  most recent imaging studies, Face-to-face time with the patient , and post visit ordering of testing and therapeutics.    Follow-up: Return in about 6 months (around 05/31/2022).   Crecencio Mc, MD

## 2021-11-28 NOTE — Assessment & Plan Note (Addendum)
Normal  B 12 level today despite the fact that she has not been taking injections  ? ?Lab Results  ?Component Value Date  ? [id number] >1504 (H) 11/28/2021  ? ? ?

## 2021-11-28 NOTE — Assessment & Plan Note (Addendum)
Improved with Whitney's  Decreased agitation  .  Still not sleeping well , but she prefers to avoid sedating agents.   ?

## 2021-11-28 NOTE — Patient Instructions (Signed)
I'm glad Hailey Hunt is improving ? ?I agree with letting [name] weigh in on dose adjustment ? ?Tramadol and synthroid have been refilled ?

## 2021-11-30 LAB — INTRINSIC FACTOR ANTIBODIES: Intrinsic Factor: NEGATIVE

## 2021-12-01 ENCOUNTER — Telehealth: Payer: Self-pay | Admitting: Internal Medicine

## 2021-12-01 DIAGNOSIS — N182 Chronic kidney disease, stage 2 (mild): Secondary | ICD-10-CM | POA: Insufficient documentation

## 2021-12-01 DIAGNOSIS — R944 Abnormal results of kidney function studies: Secondary | ICD-10-CM | POA: Insufficient documentation

## 2021-12-01 NOTE — Assessment & Plan Note (Addendum)
Well controlled without medications currently . Renal function has declined slightly since September , no changes today.  Will check for proteinuria  ? ?Lab Results  ?Component Value Date  ? CREATININE 1.09 11/28/2021  ? ?Lab Results  ?Component Value Date  ? NA 140 11/28/2021  ? K 4.5 11/28/2021  ? CL 104 11/28/2021  ? CO2 30 11/28/2021  ? ? ?

## 2021-12-01 NOTE — Assessment & Plan Note (Signed)
GFR has been 59 ml/min until today.  Will repeat when not fasting ? ?Lab Results  ?Component Value Date  ? CREATININE 1.09 11/28/2021  ? ? ?

## 2021-12-01 NOTE — Assessment & Plan Note (Addendum)
BP is currently at goal without medication.  Continue annual imaging of head neck and abdomen and /semi annual follow up with [facility]   ?

## 2021-12-02 NOTE — Telephone Encounter (Signed)
My Chart message sent

## 2021-12-05 ENCOUNTER — Other Ambulatory Visit: Payer: Self-pay

## 2021-12-05 ENCOUNTER — Other Ambulatory Visit (INDEPENDENT_AMBULATORY_CARE_PROVIDER_SITE_OTHER): Payer: Medicare Other

## 2021-12-05 DIAGNOSIS — R944 Abnormal results of kidney function studies: Secondary | ICD-10-CM | POA: Diagnosis not present

## 2021-12-05 LAB — BASIC METABOLIC PANEL
BUN: 13 mg/dL (ref 6–23)
CO2: 28 mEq/L (ref 19–32)
Calcium: 9 mg/dL (ref 8.4–10.5)
Chloride: 104 mEq/L (ref 96–112)
Creatinine, Ser: 0.85 mg/dL (ref 0.40–1.20)
GFR: 68.01 mL/min (ref >60.00)
Glucose, Bld: 88 mg/dL (ref 70–99)
Potassium: 4.1 mEq/L (ref 3.5–5.1)
Sodium: 139 mEq/L (ref 135–145)

## 2021-12-08 DIAGNOSIS — Z79899 Other long term (current) drug therapy: Secondary | ICD-10-CM | POA: Diagnosis not present

## 2021-12-08 DIAGNOSIS — Z880 Allergy status to penicillin: Secondary | ICD-10-CM | POA: Diagnosis not present

## 2021-12-08 DIAGNOSIS — Z882 Allergy status to sulfonamides status: Secondary | ICD-10-CM | POA: Diagnosis not present

## 2021-12-08 DIAGNOSIS — I10 Essential (primary) hypertension: Secondary | ICD-10-CM | POA: Diagnosis not present

## 2021-12-08 DIAGNOSIS — Z90710 Acquired absence of both cervix and uterus: Secondary | ICD-10-CM | POA: Diagnosis not present

## 2021-12-08 DIAGNOSIS — Z681 Body mass index (BMI) 19 or less, adult: Secondary | ICD-10-CM | POA: Diagnosis not present

## 2021-12-08 DIAGNOSIS — G43011 Migraine without aura, intractable, with status migrainosus: Secondary | ICD-10-CM | POA: Diagnosis not present

## 2021-12-08 DIAGNOSIS — Z885 Allergy status to narcotic agent status: Secondary | ICD-10-CM | POA: Diagnosis not present

## 2021-12-08 DIAGNOSIS — R519 Headache, unspecified: Secondary | ICD-10-CM | POA: Diagnosis not present

## 2021-12-08 DIAGNOSIS — Z7982 Long term (current) use of aspirin: Secondary | ICD-10-CM | POA: Diagnosis not present

## 2021-12-18 ENCOUNTER — Telehealth: Payer: Self-pay | Admitting: Internal Medicine

## 2021-12-18 ENCOUNTER — Other Ambulatory Visit: Payer: Self-pay | Admitting: Internal Medicine

## 2021-12-18 DIAGNOSIS — I701 Atherosclerosis of renal artery: Secondary | ICD-10-CM

## 2021-12-18 MED ORDER — AMLODIPINE BESYLATE 2.5 MG PO TABS: 2.5000 mg | ORAL_TABLET | Freq: Every day | ORAL | 3 refills | Status: DC

## 2021-12-18 NOTE — Telephone Encounter (Signed)
Patient dropped off paper work from [facility] for Dr Derrel Nip to review. Papers are up front in Dr Lupita Dawn color folder. ?

## 2021-12-18 NOTE — Telephone Encounter (Signed)
Paperwork is in yellow folder ?

## 2021-12-18 NOTE — Assessment & Plan Note (Signed)
Resuming amlodipine 2.5 mg daily for management of recent elevations noted at home and in ER  ?

## 2021-12-23 DIAGNOSIS — D485 Neoplasm of uncertain behavior of skin: Secondary | ICD-10-CM | POA: Diagnosis not present

## 2021-12-23 DIAGNOSIS — L820 Inflamed seborrheic keratosis: Secondary | ICD-10-CM | POA: Diagnosis not present

## 2021-12-23 DIAGNOSIS — D2261 Melanocytic nevi of right upper limb, including shoulder: Secondary | ICD-10-CM | POA: Diagnosis not present

## 2021-12-23 DIAGNOSIS — D2272 Melanocytic nevi of left lower limb, including hip: Secondary | ICD-10-CM | POA: Diagnosis not present

## 2021-12-23 DIAGNOSIS — D2271 Melanocytic nevi of right lower limb, including hip: Secondary | ICD-10-CM | POA: Diagnosis not present

## 2021-12-23 DIAGNOSIS — L814 Other melanin hyperpigmentation: Secondary | ICD-10-CM | POA: Diagnosis not present

## 2021-12-23 DIAGNOSIS — D225 Melanocytic nevi of trunk: Secondary | ICD-10-CM | POA: Diagnosis not present

## 2021-12-23 DIAGNOSIS — D2262 Melanocytic nevi of left upper limb, including shoulder: Secondary | ICD-10-CM | POA: Diagnosis not present

## 2022-01-21 NOTE — Progress Notes (Signed)
?Charlann Boxer D.O. ?[facility] ?[place] ?Phone: [phone] ?Subjective:   ?I, Vilma Meckel, am serving as a Education administrator for Dr. Hulan Saas. ?This visit occurred during the SARS-CoV-2 public health emergency.  Safety protocols were in place, including screening questions prior to the visit, additional usage of staff PPE, and extensive cleaning of exam room while observing appropriate contact time as indicated for disinfecting solutions.  ? ?I'm seeing this patient by the request  of:  Crecencio Mc, MD ? ?CC: Right knee pain ? ?[id number]  ?Last seen Aug 2022 ? ?Hailey Hunt is a 76 y.o. female coming in with complaint of right knee pain. Possible cyst. Knot has been there for a while. Was causing pain, but now is not. No other complaints. ? ? ? ?  ? ?Past Medical History:  ?Diagnosis Date  ? Aberrant right subclavian artery   ? Cataract   ? surgery  ? Clotting disorder ([place])   ? hx of bleeding after vaginal delivery, dr told her not to have any surgeries that are necessary  ? Dysplasia, artery, fibromuscular, renal ([place])   ? Fibromuscular dysplasia ([place])   ? Fibromuscular dysplasia of renal artery (HCC)   ? managed by Dr. Gregary Signs [facility]  ? Graves disease   ? Heart murmur   ? Heart murmur   ? Hypertension   ? Kidney stone   ? Migraines   ? Neuropathy with IgA monoclonal gammopathy (HCC)   ? Dr Mervyn Skeeters  ? Paget's disease of the bone   ? Pernicious anemia   ? Thyroid disease   ? Urinary incontinence, mixed   ? resolved with removal of ained sutures in bladder   ? ?Past Surgical History:  ?Procedure Laterality Date  ? BILATERAL SALPINGOOPHORECTOMY    ? CATARACT EXTRACTION  Sep 10, 2011, Jan 13,2013  ? COLONOSCOPY    ? RENAL BIOPSY    ? TONSILLECTOMY    ? VAGINAL DELIVERY    ? x2  ? VAGINAL HYSTERECTOMY    ? WISDOM TOOTH EXTRACTION    ? ?Social History  ? ?Socioeconomic History  ? Marital status: Married  ?  Spouse name: Not on file  ? Number of children: 2  ? Years  of education: Not on file  ? Highest education level: Not on file  ?Occupational History  ? 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 Smoking status: Never  ? Smokeless tobacco: Never  ?Vaping Use  ? Vaping Use: Never used  ?Substance and Sexual Activity  ? Alcohol use: Yes  ?  Comment: once a year  ? Drug use: No  ? Sexual activity: Not Currently  ?Other Topics Concern  ? Not on file  ?Social History Narrative  ? Not on file  ? ?Social Determinants of Health  ? ?Financial Resource Strain: Low Risk   ? Difficulty of Paying Living Expenses: Not hard at all  ?Food Insecurity: No Food Insecurity  ? Worried About Charity fundraiser in the Last Year: Never true  ? Ran Out of Food in the Last Year: Never true  ?Transportation Needs: No Transportation Needs  ? Lack of Transportation (Medical): No  ? Lack of Transportation (Non-Medical): No  ?Physical Activity: Sufficiently Active  ? Days of Exercise per Week: 7 days  ? Minutes of Exercise per Session: 60 min  ?Stress: No Stress Concern Present  ? Feeling of Stress : Not at all  ?Social Connections: Unknown  ?  Frequency of Communication with Friends and Family: Not on file  ? Frequency of Social Gatherings with Friends and Family: Not on file  ? Attends Religious Services: Not on file  ? Active Member of Clubs or Organizations: Not on file  ? Attends Archivist Meetings: Not on file  ? Marital Status: Married  ? ?Allergies  ?Allergen Reactions  ? Voltaren [Diclofenac] Hypertension  ? Adhesive [Tape]   ? Demerol   ?  HYPOTENSION  ? Macrodantin   ? Nsaids Hypertension  ? Voltaren [Diclofenac Sodium] Other (See Comments)  ?  FMD, increased bp  ? Codeine Nausea And Vomiting and Rash  ? Nickel Rash  ? Penicillins Rash  ? Sulfa Antibiotics Rash  ?  Fever  ? ?Family History  ?Problem Relation Age of Onset  ? Anuerysm Mother   ? COPD Mother   ? Hypertension Mother   ? Heart disease Mother   ? Heart failure Mother   ? Cancer Father   ?     lung CA  ? Heart  disease Father   ? Heart attack Father   ? Heart disease Brother   ?     viral cardiomyopathy, cocaine use  ? Drug abuse Brother   ? Cancer Maternal Aunt   ?     esophageal and live rCA,  hisotory of  ETOH and tobacco  ? Anuerysm Maternal Grandfather   ? Cancer Paternal Grandmother   ?     brain tumor  ? Diabetes Sister   ? Colon cancer Paternal Uncle   ? ? ?Current Outpatient Medications (Endocrine & Metabolic):  ?  denosumab (PROLIA) 60 MG/ML SOSY injection,  ?  SYNTHROID 50 MCG tablet, TAKE ONE TABLET BY MOUTH DAILY ON AN EMPTY STOMACH WITH A FULL GLASS OF WATER AT LEAST 30-60 MINUTES BEFORE BREAKFAST ? ?Current Outpatient Medications (Cardiovascular):  ?  amLODipine (NORVASC) 2.5 MG tablet, Take 1 tablet (2.5 mg total) by mouth daily. ? ?Current Outpatient Medications (Respiratory):  ?  montelukast (SINGULAIR) 10 MG tablet, Take 1 tablet (10 mg total) by mouth at bedtime. ? ?Current Outpatient Medications (Analgesics):  ?  ASPIRIN 81 PO,  ?  traMADol (ULTRAM) 50 MG tablet, Take 1 tablet (50 mg total) by mouth every 8 (eight) hours as needed. ? ? ?Current Outpatient Medications (Other):  ?  cholecalciferol (VITAMIN D) 1000 UNITS tablet, Take 1,000 Units by mouth daily. ?  Multiple Vitamins-Minerals (CENTRUM SILVER 50+WOMEN PO), Take by mouth. ?  omeprazole (PRILOSEC) 40 MG capsule, Take 1 capsule (40 mg total) by mouth daily. (Patient taking differently: Take 40 mg by mouth as needed.) ?  prednisoLONE acetate (PRED FORTE) 1 % ophthalmic suspension,  ?  Syringe/Needle, Disp, (SYRINGE 3CC/27GX1-1/4") 27G X 1-1/4" 3 ML MISC, For B12 injections ?  tretinoin (RETIN-A) 0.025 % cream, APPLY TOPICALLY EVERY NIGHT AT BEDTIME ?  Varenicline Tartrate (TYRVAYA) 0.03 MG/ACT SOLN,  ? ? ?Reviewed prior external information including notes and imaging from  ?primary care provider ?As well as notes that were available from care everywhere and other healthcare systems. ? ?Past medical history, social, surgical and family history  all reviewed in electronic medical record.  No pertanent information unless stated regarding to the chief complaint.  ? ?Review of Systems: ? No headache, visual changes, nausea, vomiting, diarrhea, constipation, dizziness, abdominal pain, skin rash, fevers, chills, night sweats, weight loss, swollen lymph nodes, body aches, joint swelling, chest pain, shortness of breath, mood changes. POSITIVE muscle aches ? ?Objective  ?  Blood pressure 102/62, pulse 66, height 5' (1.524 m), weight 96 lb (43.5 kg), SpO2 97 %. ?  ?General: No apparent distress alert and oriented x3 mood and affect normal, dressed appropriately.  ?HEENT: Pupils equal, extraocular movements intact  ?Respiratory: Patient's speak in full sentences and does not appear short of breath  ?Cardiovascular: No lower extremity edema, non tender, no erythema  ?Gait normal with good balance and coordination.  ?MSK: Right knee exam does have something superficial to the VMO muscle.  Patient is freely movable.  Minorly tender to palpation.  Patient muscles remove independently to this area with flexion extension.  Does not limit any range of motion. ? ? ?Limited muscular skeletal ultrasound was performed and interpreted by Hulan Saas, M  ?Limited ultrasound of patient's area in question above the VMO does have a cyst formation that actually seems to be almost intramuscular.  Seems to be mostly though on the superficial aspect.  Does have hyperechoic changes that either show calcific changes.  Not quite lipoma aspect noted.  No abnormal vascularity noted or Doppler flow.  No capsular distention into the muscle fibers. 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  ?Impression and Recommendations:  ?  ? ?The above documentation has been reviewed and is accurate and complete Lyndal Pulley, DO ? ? ? ?

## 2022-01-22 ENCOUNTER — Encounter: Payer: Self-pay | Admitting: Family Medicine

## 2022-01-22 ENCOUNTER — Ambulatory Visit (INDEPENDENT_AMBULATORY_CARE_PROVIDER_SITE_OTHER): Payer: Medicare Other | Admitting: Family Medicine

## 2022-01-22 ENCOUNTER — Ambulatory Visit: Payer: Medicare Other

## 2022-01-22 VITALS — BP 102/62 | HR 66 | Ht 60.0 in | Wt 96.0 lb

## 2022-01-22 DIAGNOSIS — M25561 Pain in right knee: Secondary | ICD-10-CM

## 2022-01-22 DIAGNOSIS — I701 Atherosclerosis of renal artery: Secondary | ICD-10-CM

## 2022-01-22 DIAGNOSIS — M67469 Ganglion, unspecified knee: Secondary | ICD-10-CM | POA: Insufficient documentation

## 2022-01-22 DIAGNOSIS — I150 Renovascular hypertension: Secondary | ICD-10-CM | POA: Diagnosis not present

## 2022-01-22 NOTE — Patient Instructions (Signed)
Looks like calcific ganglion cyst ?Nothing to do unless it gets bigger painful and/or red ?See me when you need me ?

## 2022-01-22 NOTE — Assessment & Plan Note (Signed)
Patient does have what appears to be more likely a ganglion cyst.  Could be a potential lipoma aspect as well but does seem to be more boggy and have the consistency of the cyst.  Ultrasound does not show any significant abnormality in blood vessels.  Patient has no other aggressive findings noted.  We discussed the possibility of aspiration but secondary to the hyperechoic change of the material inside could be calcific and surgical intervention may be better if necessary.  Patient states she feels better now that she knows somewhat that it is safer to leave at the moment and we will continue to watch it.  If any enlargement, redness, streaking occurs to seek medical attention. ?

## 2022-02-05 NOTE — Telephone Encounter (Signed)
Prolia VOB initiated via [email or web address] ? ?Last Prolia inj 09/11/21 ?Next Prolia inj due 03/13/22 ?

## 2022-02-11 NOTE — Telephone Encounter (Signed)
Pt ready for scheduling on or after 03/13/22 ? ?Out-of-pocket cost due at time of visit: $0 ? ?Primary: Medicare ?Prolia co-insurance: 20% (approximately $276) ?Admin fee co-insurance: 20% (approximately $25) ? ?Secondary: BCBS [place] Medicare Supp ?Prolia co-insurance: Covers Medicare Part B co-insurance ?Admin fee co-insurance: Covers Medicare Part B co-insurance ? ?Deductible:  Covered by secondary ? ?Prior Auth: not required ?PA# ?Valid:  ? ?** This summary of benefits is an estimation of the patient's out-of-pocket cost. Exact cost may vary based on individual plan coverage.  ? ?

## 2022-03-20 DIAGNOSIS — N281 Cyst of kidney, acquired: Secondary | ICD-10-CM | POA: Diagnosis not present

## 2022-03-20 DIAGNOSIS — I10 Essential (primary) hypertension: Secondary | ICD-10-CM | POA: Diagnosis not present

## 2022-03-20 DIAGNOSIS — R3129 Other microscopic hematuria: Secondary | ICD-10-CM | POA: Diagnosis not present

## 2022-03-24 DIAGNOSIS — I10 Essential (primary) hypertension: Secondary | ICD-10-CM | POA: Diagnosis not present

## 2022-03-24 DIAGNOSIS — Z1231 Encounter for screening mammogram for malignant neoplasm of breast: Secondary | ICD-10-CM | POA: Diagnosis not present

## 2022-03-24 DIAGNOSIS — R3129 Other microscopic hematuria: Secondary | ICD-10-CM | POA: Diagnosis not present

## 2022-03-24 DIAGNOSIS — I773 Arterial fibromuscular dysplasia: Secondary | ICD-10-CM | POA: Diagnosis not present

## 2022-03-24 DIAGNOSIS — Z681 Body mass index (BMI) 19 or less, adult: Secondary | ICD-10-CM | POA: Diagnosis not present

## 2022-03-24 LAB — HM MAMMOGRAPHY

## 2022-03-28 ENCOUNTER — Ambulatory Visit (INDEPENDENT_AMBULATORY_CARE_PROVIDER_SITE_OTHER): Payer: Medicare Other

## 2022-03-28 DIAGNOSIS — M810 Age-related osteoporosis without current pathological fracture: Secondary | ICD-10-CM | POA: Diagnosis not present

## 2022-03-28 MED ORDER — DENOSUMAB 60 MG/ML ~~LOC~~ SOSY
60.0000 mg | PREFILLED_SYRINGE | Freq: Once | SUBCUTANEOUS | Status: AC
  Administered 2022-03-28: 60 mg via SUBCUTANEOUS

## 2022-03-28 NOTE — Progress Notes (Signed)
Patient seen today for Prolia injection.  '60mg'$ /ml given in Left arm subQ.  Patient tolerated well.  Follow up 6 months or as advised by provider.   Patient verified name, DOB and provided verbal consent prior to administration.

## 2022-04-12 NOTE — Telephone Encounter (Signed)
Last Prolia inj 03/28/22 Next Prolia inj due 09/28/22

## 2022-04-24 DIAGNOSIS — H40013 Open angle with borderline findings, low risk, bilateral: Secondary | ICD-10-CM | POA: Diagnosis not present

## 2022-05-06 DIAGNOSIS — R233 Spontaneous ecchymoses: Secondary | ICD-10-CM | POA: Diagnosis not present

## 2022-05-06 DIAGNOSIS — I6521 Occlusion and stenosis of right carotid artery: Secondary | ICD-10-CM | POA: Diagnosis not present

## 2022-05-06 DIAGNOSIS — I773 Arterial fibromuscular dysplasia: Secondary | ICD-10-CM | POA: Diagnosis not present

## 2022-05-06 DIAGNOSIS — I10 Essential (primary) hypertension: Secondary | ICD-10-CM | POA: Diagnosis not present

## 2022-05-15 ENCOUNTER — Telehealth: Payer: Self-pay | Admitting: Internal Medicine

## 2022-05-15 NOTE — Telephone Encounter (Signed)
Patient returned [facility]'s call.  I read message from Juliann Pulse to patient.  Patient states she will call Juliann Pulse to schedule her appointment with Denisa O'Brien-Blaney, LPN.

## 2022-05-15 NOTE — Telephone Encounter (Signed)
Copied from [place] [phone]. Topic: Medicare AWV >> May 15, 2022 11:10 AM Devoria Glassing wrote: Reason for CRM: Left message for patient to schedule Annual Wellness Visit.  Please schedule with Nurse Health Advisor Denisa O'Brien-Blaney, LPN at [facility]. This appt can be telephone or office visit.  Please call [phone] ask for [facility]

## 2022-05-20 ENCOUNTER — Ambulatory Visit (INDEPENDENT_AMBULATORY_CARE_PROVIDER_SITE_OTHER): Payer: Medicare Other

## 2022-05-20 VITALS — Ht 60.0 in | Wt 96.0 lb

## 2022-05-20 DIAGNOSIS — Z0000 Encounter for general adult medical examination without abnormal findings: Secondary | ICD-10-CM

## 2022-05-20 NOTE — Patient Instructions (Addendum)
Ms. Hailey Hunt , Thank you for taking time to come for your Medicare Wellness Visit. I appreciate your ongoing commitment to your health goals. Please review the following plan we discussed and let me know if I can assist you in the future.   These are the goals we discussed:  Goals      Eat a nutritional diet     Follow up with Primary Care Provider     Keep routine maintenance appointments.        This is a list of the screening recommended for you and due dates:  Health Maintenance  Topic Date Due   Flu Shot  04/29/2022   COVID-19 Vaccine (4 - Moderna series) 12/03/2022*   Mammogram  03/25/2023   Tetanus Vaccine  07/15/2023   Colon Cancer Screening  05/26/2026   Pneumonia Vaccine  Completed   DEXA scan (bone density measurement)  Completed   Hepatitis C Screening: USPSTF Recommendation to screen - Ages 59-79 yo.  Completed   Zoster (Shingles) Vaccine  Completed   HPV Vaccine  Aged Out  *Topic was postponed. The date shown is not the original due date.   Opioid Pain Medicine Management Opioids are powerful medicines that are used to treat moderate to severe pain. When used for short periods of time, they can help you to: Sleep better. Do better in physical or occupational therapy. Feel better in the first few days after an injury. Recover from surgery. Opioids should be taken with the supervision of a trained health care provider. They should be taken for the shortest period of time possible. This is because opioids can be addictive, and the longer you take opioids, the greater your risk of addiction. This addiction can also be called opioid use disorder. What are the risks? Using opioid pain medicines for longer than 3 days increases your risk of side effects. Side effects include: Constipation. Nausea and vomiting. Breathing difficulties (respiratory depression). Drowsiness. Confusion. Opioid use disorder. Itching. Taking opioid pain medicine for a long period of time  can affect your ability to do daily tasks. It also puts you at risk for: Motor vehicle crashes. Depression. Suicide. Heart attack. Overdose, which can be life-threatening. What is a pain treatment plan? A pain treatment plan is an agreement between you and your health care provider. Pain is unique to each person, and treatments vary depending on your condition. To manage your pain, you and your health care provider need to work together. To help you do this: Discuss the goals of your treatment, including how much pain you might expect to have and how you will manage the pain. Review the risks and benefits of taking opioid medicines. Remember that a good treatment plan uses more than one approach and minimizes the chance of side effects. Be honest about the amount of medicines you take and about any drug or alcohol use. Get pain medicine prescriptions from only one health care provider. Pain can be managed with many types of alternative treatments. Ask your health care provider to refer you to one or more specialists who can help you manage pain through: Physical or occupational therapy. Counseling (cognitive behavioral therapy). Good nutrition. Biofeedback. Massage. Meditation. Non-opioid medicine. Following a gentle exercise program. How to use opioid pain medicine Taking medicine Take your pain medicine exactly as told by your health care provider. Take it only when you need it. If your pain gets less severe, you may take less than your prescribed dose if your health care provider approves.  If you are not having pain, do nottake pain medicine unless your health care provider tells you to take it. If your pain is severe, do nottry to treat it yourself by taking more pills than instructed on your prescription. Contact your health care provider for help. Write down the times when you take your pain medicine. It is easy to become confused while on pain medicine. Writing the time can help  you avoid overdose. Take other over-the-counter or prescription medicines only as told by your health care provider. Keeping yourself and others safe  While you are taking opioid pain medicine: Do not drive, use machinery, or power tools. Do not sign legal documents. Do not drink alcohol. Do not take sleeping pills. Do not supervise children by yourself. Do not do activities that require climbing or being in high places. Do not go to a lake, river, ocean, spa, or swimming pool. Do not share your pain medicine with anyone. Keep pain medicine in a locked cabinet or in a secure area where pets and children cannot reach it. Stopping your use of opioids If you have been taking opioid medicine for more than a few weeks, you may need to slowly decrease (taper) how much you take until you stop completely. Tapering your use of opioids can decrease your risk of symptoms of withdrawal, such as: Pain and cramping in the abdomen. Nausea. Sweating. Sleepiness. Restlessness. Uncontrollable shaking (tremors). Cravings for the medicine. Do not attempt to taper your use of opioids on your own. Talk with your health care provider about how to do this. Your health care provider may prescribe a step-down schedule based on how much medicine you are taking and how long you have been taking it. Getting rid of leftover pills Do not save any leftover pills. Get rid of leftover pills safely by: Taking the medicine to a prescription take-back program. This is usually offered by the county or law enforcement. Bringing them to a pharmacy that has a drug disposal container. Flushing them down the toilet. Check the label or package insert of your medicine to see whether this is safe to do. Throwing them out in the trash. Check the label or package insert of your medicine to see whether this is safe to do. If it is safe to throw it out, remove the medicine from the original container, put it into a sealable bag or  container, and mix it with used coffee grounds, food scraps, dirt, or cat litter before putting it in the trash. Follow these instructions at home: Activity Do exercises as told by your health care provider. Avoid activities that make your pain worse. Return to your normal activities as told by your health care provider. Ask your health care provider what activities are safe for you. General instructions You may need to take these actions to prevent or treat constipation: Drink enough fluid to keep your urine pale yellow. Take over-the-counter or prescription medicines. Eat foods that are high in fiber, such as beans, whole grains, and fresh fruits and vegetables. Limit foods that are high in fat and processed sugars, such as fried or sweet foods. Keep all follow-up visits. This is important. Where to find support If you have been taking opioids for a long time, you may benefit from receiving support for quitting from a local support group or counselor. Ask your health care provider for a referral to these resources in your area. Where to find more information Centers for Disease Control and Prevention (CDC): http://www.wolf.info/ [place]  Food and Drug Administration (FDA): [place] Get help right away if: You may have taken too much of an opioid (overdosed). Common symptoms of an overdose: Your breathing is slower or more shallow than normal. You have a very slow heartbeat (pulse). You have slurred speech. You have nausea and vomiting. Your pupils become very small. You have other potential symptoms: You are very confused. You faint or feel like you will faint. You have cold, clammy skin. You have blue lips or fingernails. You have thoughts of harming yourself or harming others. These symptoms may represent a serious problem that is an emergency. Do not wait to see if the symptoms will go away. Get medical help right away. Call your local emergency services (911 in the [place] Do not drive  yourself to the hospital.  If you ever feel like you may hurt yourself or others, or have thoughts about taking your own life, get help right away. Go to your nearest emergency department or: Call your local emergency services (911 in the [place] Call the [facility] ([phone] in the [place].). Call a suicide crisis helpline, such as the [facility] at (606)037-5795 or 988 in the [place]. This is open 24 hours a day in the [place] Text the Crisis Text Line at 671-505-2381 (in the [place] Summary Opioid medicines can help you manage moderate to severe pain for a short period of time. A pain treatment plan is an agreement between you and your health care provider. Discuss the goals of your treatment, including how much pain you might expect to have and how you will manage the pain. If you think that you or someone else may have taken too much of an opioid, get medical help right away. This information is not intended to replace advice given to you by your health care provider. Make sure you discuss any questions you have with your health care provider. Document Revised: 04/10/2021 Document Reviewed: 12/26/2020 Elsevier Patient Education  [place].

## 2022-05-20 NOTE — Progress Notes (Addendum)
Subjective:   Hailey Hunt is a 76 y.o. female who presents for Medicare Annual (Subsequent) preventive examination.  Review of Systems    No ROS.  Medicare Wellness Virtual Visit.  Visual/audio telehealth visit, UTA vital signs.   See social history for additional risk factors.   Cardiac Risk Factors include: advanced age (>14mn, >>37women)     Objective:    Today's Vitals   05/20/22 1539  Weight: 96 lb (43.5 kg)  Height: 5' (1.524 m)   Body mass index is 18.75 kg/m.     05/20/2022    3:25 PM 05/15/2021    9:15 AM 05/14/2020    9:10 AM 05/12/2019    9:15 AM 05/10/2018    9:41 AM 12/17/2016    3:20 PM 05/26/2016   10:35 AM  Advanced Directives  Does Patient Have a Medical Advance Directive? Yes Yes Yes Yes Yes Yes Yes  Type of AParamedicof ADennis AcresLiving will HIrwindaleLiving will HMadisonvilleLiving will HBlytheLiving will HHedleyLiving will HHartshorneLiving will HHackneyvilleLiving will  Does patient want to make changes to medical advance directive? No - Patient declined No - Patient declined No - Patient declined No - Patient declined No - Patient declined No - Patient declined   Copy of HWallacein Chart? No - copy requested No - copy requested No - copy requested No - copy requested No - copy requested No - copy requested     Current Medications (verified) Outpatient Encounter Medications as of 05/20/2022  Medication Sig   amLODipine (NORVASC) 2.5 MG tablet Take 1 tablet (2.5 mg total) by mouth daily.   ASPIRIN 81 PO    cholecalciferol (VITAMIN D) 1000 UNITS tablet Take 1,000 Units by mouth daily.   denosumab (PROLIA) 60 MG/ML SOSY injection    montelukast (SINGULAIR) 10 MG tablet Take 1 tablet (10 mg total) by mouth at bedtime.   Multiple Vitamins-Minerals (CENTRUM SILVER 50+WOMEN PO) Take by mouth.    omeprazole (PRILOSEC) 40 MG capsule Take 1 capsule (40 mg total) by mouth daily. (Patient taking differently: Take 40 mg by mouth as needed.)   prednisoLONE acetate (PRED FORTE) 1 % ophthalmic suspension    SYNTHROID 50 MCG tablet TAKE ONE TABLET BY MOUTH DAILY ON AN EMPTY STOMACH WITH A FULL GLASS OF WATER AT LEAST 30-60 MINUTES BEFORE BREAKFAST   Syringe/Needle, Disp, (SYRINGE 3CC/27GX1-1/4") 27G X 1-1/4" 3 ML MISC For B12 injections   traMADol (ULTRAM) 50 MG tablet Take 1 tablet (50 mg total) by mouth every 8 (eight) hours as needed.   tretinoin (RETIN-A) 0.025 % cream APPLY TOPICALLY EVERY NIGHT AT BEDTIME   [DISCONTINUED] Varenicline Tartrate (TYRVAYA) 0.03 MG/ACT SOLN    No facility-administered encounter medications on file as of 05/20/2022.    Allergies (verified) Voltaren [diclofenac], Adhesive [tape], Demerol, Macrodantin, Nsaids, Voltaren [diclofenac sodium], Codeine, Nickel, Penicillins, and Sulfa antibiotics   History: Past Medical History:  Diagnosis Date   Aberrant right subclavian artery    Cataract    surgery   Clotting disorder (HCC)    hx of bleeding after vaginal delivery, dr told her not to have any surgeries that are necessary   Dysplasia, artery, fibromuscular, renal ([place]    Fibromuscular dysplasia (HW[place]    Fibromuscular dysplasia of renal artery ([place]    managed by Dr. KGregary SignsNephrology   Graves disease    Heart murmur  Heart murmur    Hypertension    Kidney stone    Migraines    Neuropathy with IgA monoclonal gammopathy (HCC)    Dr Mervyn Skeeters   Paget's disease of the bone    Pernicious anemia    Thyroid disease    Urinary incontinence, mixed    resolved with removal of ained sutures in bladder    Past Surgical History:  Procedure Laterality Date   BILATERAL SALPINGOOPHORECTOMY     CATARACT EXTRACTION  Sep 10, 2011, Jan 13,2013   COLONOSCOPY     RENAL BIOPSY     TONSILLECTOMY     VAGINAL DELIVERY     x2   VAGINAL HYSTERECTOMY     WISDOM TOOTH  EXTRACTION     Family History  Problem Relation Age of Onset   Anuerysm Mother    COPD Mother    Hypertension Mother    Heart disease Mother    Heart failure Mother    Cancer Father        lung CA   Heart disease Father    Heart attack Father    Heart disease Brother        viral cardiomyopathy, cocaine use   Drug abuse Brother    Cancer Maternal Aunt        esophageal and live rCA,  hisotory of  ETOH and tobacco   Anuerysm Maternal Grandfather    Cancer Paternal Grandmother        brain tumor   Diabetes Sister    Colon cancer Paternal Uncle    Social History   Socioeconomic History   Marital status: Married    Spouse name: Not on file   Number of children: 2   Years of education: Not on file   Highest education level: Not on file  Occupational History   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  Tobacco Use   Smoking status: Never   Smokeless tobacco: Never  Vaping Use   Vaping Use: Never used  Substance and Sexual Activity   Alcohol use: Yes    Comment: once a year   Drug use: No   Sexual activity: Not Currently  Other Topics Concern   Not on file  Social History Narrative   Not on file   Social Determinants of Health   Financial Resource Strain: Low Risk  (05/20/2022)   Overall Financial Resource Strain (CARDIA)    Difficulty of Paying Living Expenses: Not hard at all  Food Insecurity: No Food Insecurity (05/20/2022)   Hunger Vital Sign    Worried About Running Out of Food in the Last Year: Never true    [place] in the Last Year: Never true  Transportation Needs: No Transportation Needs (05/20/2022)   PRAPARE - Transportation    Lack of Transportation (Medical): No    Lack of Transportation (Non-Medical): No  Physical Activity: Sufficiently Active (05/20/2022)   Exercise Vital Sign    Days of Exercise per Week: 7 days    Minutes of Exercise per Session: 60 min  Stress: No Stress Concern Present (05/20/2022)   [place]    Feeling of Stress : Not at all  Social Connections: Unknown (05/20/2022)   Social Connection and Isolation Panel [NHANES]    Frequency of Communication with Friends and Family: Not on file    Frequency of Social Gatherings with Friends and Family: Not on file    Attends Religious Services: Not on file  Active Member of Clubs or Organizations: Not on file    Attends Archivist Meetings: Not on file    Marital Status: Married    Tobacco Counseling Counseling given: Not Answered  Clinical Intake: Pre-visit preparation completed: Yes        Diabetes: No  How often do you need to have someone help you when you read instructions, pamphlets, or other written materials from your doctor or pharmacy?: 1 - Never  Interpreter Needed?: No    Activities of Daily Living    05/20/2022    3:31 PM  In your present state of health, do you have any difficulty performing the following activities:  Hearing? 0  Vision? 0  Difficulty concentrating or making decisions? 0  Walking or climbing stairs? 0  Dressing or bathing? 0  Doing errands, shopping? 0  Preparing Food and eating ? N  Using the Toilet? N  In the past six months, have you accidently leaked urine? N  Do you have problems with loss of bowel control? N  Managing your Medications? N  Managing your Finances? N  Housekeeping or managing your Housekeeping? N   Patient Care Team: Crecencio Mc, MD as PCP - General (Internal Medicine) Mervyn Skeeters Kerrie Buffalo, MD (Nephrology) Minna Merritts, MD as Consulting Physician (Cardiology)  Indicate any recent Medical Services you may have received from other than [facility] providers in the past year (date may be approximate).     Assessment:   This is a routine wellness examination for Sahily.  Virtual Visit via Telephone Note  I connected with  Hailey Hunt on 05/20/22 at  3:15 PM EDT by telephone and verified that I am speaking with the  correct person using two identifiers.  Location: Patient: home Provider: office Persons participating in the virtual visit: patient/Nurse Health Advisor   I discussed the limitations of performing an evaluation and management service by telehealth. We continued and completed visit with audio only. Some vital signs may be absent or patient reported.   Hearing/Vision screen Hearing Screening - Comments:: Patient is able to hear conversational tones without difficulty. No issues reported. Vision Screening - Comments:: Followed by [facility]  Wears corrective lenses  Cataract extraction, bilateral  Glaucoma; suspect, followed by [facility] They have seen their ophthalmologist in the last 12 months.  Dietary issues and exercise activities discussed: Current Exercise Habits: Home exercise routine, Type of exercise: walking, Time (Minutes): > 60, Frequency (Times/Week): 7, Weekly Exercise (Minutes/Week): 0, Intensity: Intense Healthy diet Good water intake    Goals Addressed             This Visit's Progress    Eat a nutritional diet       Follow up with Primary Care Provider       Keep routine maintenance appointments.       Depression Screen    05/20/2022    3:49 PM 11/28/2021    8:30 AM 10/22/2021    2:07 PM 05/15/2021    9:21 AM 04/04/2021   12:59 PM 05/14/2020    9:16 AM 05/12/2019    9:15 AM  PHQ 2/9 Scores  PHQ - 2 Score 0 0 0 0 2 0 0  PHQ- 9 Score  0  0 4      Fall Risk    05/20/2022    3:50 PM 11/28/2021    8:29 AM 10/22/2021    2:06 PM 05/15/2021    9:14 AM 04/04/2021   12:59  PM  [place] in the past year? 0 0 0 0 0  Number falls in past yr: 0 0  0   Injury with Fall?  0     Risk for fall due to :  No Fall Risks No Fall Risks    Follow up Falls evaluation completed Falls evaluation completed Falls evaluation completed  Falls evaluation completed    [place]: Home free of loose throw rugs in walkways, pet  beds, electrical cords, etc? Yes  Adequate lighting in your home to reduce risk of falls? Yes   ASSISTIVE DEVICES UTILIZED TO PREVENT FALLS: Life alert? No  Use of a cane, walker or w/c? No   TIMED UP AND GO: Was the test performed? No .   Cognitive Function: Patient is alert and oriented x3.  Enjoys brain health stimulating activities like reading.      05/14/2020    9:21 AM 05/10/2018    9:48 AM 12/17/2016    3:30 PM  MMSE - Mini Mental State Exam  Not completed: Unable to complete    Orientation to time  5 5  Orientation to Place  5 5  Registration  3 3  Attention/ Calculation  5 5  Recall  3 3  Language- name 2 objects  2 2  Language- repeat  1 1  Language- follow 3 step command  3 3  Language- read & follow direction  1 1  Write a sentence  1 1  Copy design  1 1  Total score  30 30        05/20/2022    4:19 PM 05/12/2019    9:14 AM  6CIT Screen  What Year?  0 points  What month?  0 points  What time?  0 points  Count back from 20  0 points  Months in reverse 0 points 0 points  Repeat phrase  0 points  Total Score  0 points    Immunizations Immunization History  Administered Date(s) Administered   Fluad Quad(high Dose 65+) 05/30/2019   Influenza Nasal 07/09/2017   Influenza Split 07/13/2012, 07/13/2014   Influenza, High Dose Seasonal PF 07/21/2018, 06/07/2021   Influenza-Unspecified 07/04/2013, 06/30/2015, 07/14/2016, 05/30/2019, 06/22/2020   Moderna Sars-Covid-2 Vaccination 11/12/2019, 12/10/2019, 07/19/2020   PPD Test 11/19/2015   Pneumococcal Conjugate-13 07/28/2013   Pneumococcal Polysaccharide-23 07/07/2003, 07/06/2014   Tdap 07/14/2013   Zoster Recombinat (Shingrix) 04/10/2018, 06/07/2018   Zoster, Live 09/14/2008   Screening Tests Health Maintenance  Topic Date Due   INFLUENZA VACCINE  04/29/2022   COVID-19 Vaccine (4 - Moderna series) 12/03/2022 (Originally 09/13/2020)   MAMMOGRAM  03/25/2023   TETANUS/TDAP  07/15/2023   COLONOSCOPY (Pts  45-27yr Insurance coverage will need to be confirmed)  05/26/2026   Pneumonia Vaccine 76 Years old  Completed   DEXA SCAN  Completed   Hepatitis C Screening  Completed   Zoster Vaccines- Shingrix  Completed   HPV VACCINES  Aged Out   Health Maintenance Health Maintenance Due  Topic Date Due   INFLUENZA VACCINE  04/29/2022   Lung Cancer Screening: (Low Dose CT Chest recommended if Age 74-80years, 30 pack-year currently smoking OR have quit w/in 15years.) does not qualify.   Vision Screening: Recommended annual ophthalmology exams for early detection of glaucoma and other disorders of the eye.  Dental Screening: Recommended annual dental exams for proper oral hygiene  Community Resource Referral / Chronic Care Management: CRR required this visit?  No   CCM required this visit?  No      Plan:     I have personally reviewed and noted the following in the patient's chart:   Medical and social history Use of alcohol, tobacco or illicit drugs  Current medications and supplements including opioid prescriptions. Patient is currently taking opioid prescriptions. Information provided to patient regarding non-opioid alternatives. Patient advised to discuss non-opioid treatment plan with their provider. Followed by PCP.  Functional ability and status Nutritional status Physical activity Advanced directives List of other physicians Hospitalizations, surgeries, and ER visits in previous 12 months Vitals Screenings to include cognitive, depression, and falls Referrals and appointments  In addition, I have reviewed and discussed with patient certain preventive protocols, quality metrics, and best practice recommendations. A written personalized care plan for preventive services as well as general preventive health recommendations were provided to patient.     OBrien-Blaney, Deiona Hooper L, LPN   [id number]    I have reviewed the above information and agree with above.   Deborra Medina,  MD

## 2022-05-25 ENCOUNTER — Other Ambulatory Visit: Payer: Self-pay | Admitting: Internal Medicine

## 2022-05-25 DIAGNOSIS — Z23 Encounter for immunization: Secondary | ICD-10-CM | POA: Diagnosis not present

## 2022-06-04 ENCOUNTER — Encounter: Payer: Self-pay | Admitting: Internal Medicine

## 2022-06-04 ENCOUNTER — Ambulatory Visit (INDEPENDENT_AMBULATORY_CARE_PROVIDER_SITE_OTHER): Payer: Medicare Other | Admitting: Internal Medicine

## 2022-06-04 ENCOUNTER — Telehealth: Payer: Self-pay

## 2022-06-04 VITALS — BP 98/54 | HR 79 | Temp 97.7°F | Ht 60.0 in | Wt 88.4 lb

## 2022-06-04 DIAGNOSIS — M810 Age-related osteoporosis without current pathological fracture: Secondary | ICD-10-CM

## 2022-06-04 DIAGNOSIS — Z789 Other specified health status: Secondary | ICD-10-CM

## 2022-06-04 DIAGNOSIS — I150 Renovascular hypertension: Secondary | ICD-10-CM | POA: Diagnosis not present

## 2022-06-04 DIAGNOSIS — D689 Coagulation defect, unspecified: Secondary | ICD-10-CM

## 2022-06-04 DIAGNOSIS — D682 Hereditary deficiency of other clotting factors: Secondary | ICD-10-CM | POA: Diagnosis not present

## 2022-06-04 DIAGNOSIS — I6521 Occlusion and stenosis of right carotid artery: Secondary | ICD-10-CM | POA: Diagnosis not present

## 2022-06-04 DIAGNOSIS — R748 Abnormal levels of other serum enzymes: Secondary | ICD-10-CM

## 2022-06-04 DIAGNOSIS — I701 Atherosclerosis of renal artery: Secondary | ICD-10-CM | POA: Diagnosis not present

## 2022-06-04 LAB — COMPREHENSIVE METABOLIC PANEL
ALT: 163 U/L — ABNORMAL HIGH (ref 0–35)
AST: 199 U/L — ABNORMAL HIGH (ref 0–37)
Albumin: 4 g/dL (ref 3.5–5.2)
Alkaline Phosphatase: 200 U/L — ABNORMAL HIGH (ref 39–117)
BUN: 20 mg/dL (ref 6–23)
CO2: 26 mEq/L (ref 19–32)
Calcium: 9.3 mg/dL (ref 8.4–10.5)
Chloride: 103 mEq/L (ref 96–112)
Creatinine, Ser: 1.21 mg/dL — ABNORMAL HIGH (ref 0.40–1.20)
GFR: 44.36 mL/min — ABNORMAL LOW (ref >60.00)
Glucose, Bld: 69 mg/dL — ABNORMAL LOW (ref 70–99)
Potassium: 4.2 mEq/L (ref 3.5–5.1)
Sodium: 140 mEq/L (ref 135–145)
Total Bilirubin: 1 mg/dL (ref 0.2–1.2)
Total Protein: 6.1 g/dL (ref 6.0–8.3)

## 2022-06-04 NOTE — Assessment & Plan Note (Addendum)
Previous serologic workup, but she ws advised to defer any elective procedures. .  Referring back to Dr Gaylyn Cheers for follow up

## 2022-06-04 NOTE — Telephone Encounter (Signed)
FYI

## 2022-06-04 NOTE — Progress Notes (Signed)
 Subjective:  Patient ID: Hailey Hunt, female    DOB: 10/21/1945  Age: 75 y.o. MRN: [id number]  CC: The primary encounter diagnosis was Renovascular hypertension. 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   HPI Hailey Hunt presents for follow up on FMD Chief Complaint  Patient presents with   Follow-up    6 month follow up    NEEDS REFERRAL TO [facility]   Wants RSV vaccine  BP low on 1.25 amlodipine  CR fluctuation from 72 ml.min to 48 per outside labs    Salt loading with potato chips   Carotid stenosis noted on recent imaging and reviewed today.  Statin therapy discussed   Has been taking atorvastatin 40 mg for the past 4 weeks,  started by Shwarma for carotid calcifications.  Tolerating medication well.  Pre treatment LDL was 85   Outpatient Medications Prior to Visit  Medication Sig Dispense Refill   amLODipine (NORVASC) 2.5 MG tablet Take 1 tablet (2.5 mg total) by mouth daily. (Patient taking differently: Take 2.5 mg by mouth daily. Taking 1/2 tablet daily) 90 tablet 3   ASPIRIN 81 PO      atorvastatin (LIPITOR) 40 MG tablet Take 40 mg by mouth daily.     cholecalciferol (VITAMIN D) 1000 UNITS tablet Take 1,000 Units by mouth daily.     denosumab (PROLIA) 60 MG/ML SOSY injection      montelukast (SINGULAIR) 10 MG tablet Take 1 tablet (10 mg total) by mouth at bedtime. 30 tablet 3   Multiple Vitamins-Minerals (CENTRUM SILVER 50+WOMEN PO) Take by mouth.     omeprazole (PRILOSEC) 40 MG capsule Take 1 capsule (40 mg total) by mouth daily. (Patient taking differently: Take 40 mg by mouth as needed.) 30 capsule 3   SYNTHROID 50 MCG tablet TAKE ONE TABLET BY MOUTH DAILY ON AN EMPTY STOMACH WITH A FULL GLASS OF WATER AT LEAST 30-60 MINUTES BEFORE BREAKFAST 90 tablet 1   traMADol (ULTRAM) 50 MG tablet  Take 1 tablet (50 mg total) by mouth every 8 (eight) hours as needed. 30 tablet 3   tretinoin (RETIN-A) 0.025 % cream APPLY TOPICALLY EVERY NIGHT AT BEDTIME 45 g 1   prednisoLONE acetate (PRED FORTE) 1 % ophthalmic suspension  (Patient not taking: Reported on 06/04/2022)     Syringe/Needle, Disp, (SYRINGE 3CC/27GX1-1/4") 27G X 1-1/4" 3 ML MISC For B12 injections (Patient not taking: Reported on 06/04/2022) 100 each 0   No facility-administered medications prior to visit.    Review of Systems;  Patient denies headache, fevers, malaise, unintentional weight loss, skin rash, eye pain, sinus congestion and sinus pain, sore throat, dysphagia,  hemoptysis , cough, dyspnea, wheezing, chest pain, palpitations, orthopnea, edema, abdominal pain, nausea, melena, diarrhea, constipation, flank pain, dysuria, hematuria, urinary  Frequency, nocturia, numbness, tingling, seizures,  Focal weakness, Loss of consciousness,  Tremor, insomnia, depression, anxiety, and suicidal ideation.      Objective:  BP (!) 98/54 (BP Location: Left Arm, Patient Position: Sitting, Cuff Size: Normal)   Pulse 79   Temp 97.7 F (36.5 C) (Oral)   Ht 5' (1.524 m)   Wt 88 lb 6.4 oz (40.1 kg)   SpO2 97%   BMI 17.26 kg/m   BP Readings from Last 3 Encounters:  06/04/22 (!) 98/54  01/22/22 102/62  11/28/21 118/76    Wt Readings from Last 3 Encounters:    06/04/22 88 lb 6.4 oz (40.1 kg)  05/20/22 96 lb (43.5 kg)  01/22/22 96 lb (43.5 kg)    General appearance: alert, cooperative and appears stated age Ears: normal TM's and external ear canals both ears Throat: lips, mucosa, and tongue normal; teeth and gums normal Neck: no adenopathy, no carotid bruit, supple, symmetrical, trachea midline and thyroid not enlarged, symmetric, no tenderness/mass/nodules Back: symmetric, no curvature. ROM normal. No CVA tenderness. Lungs: clear to auscultation bilaterally Heart: regular rate and rhythm, S1, S2 normal, no murmur, click, rub or  gallop Abdomen: soft, non-tender; bowel sounds normal; no masses,  no organomegaly Pulses: 2+ and symmetric Skin: Skin color, texture, turgor normal. No rashes or lesions Lymph nodes: Cervical, supraclavicular, and axillary nodes normal.  Lab Results  Component Value Date   HGBA1C 5.4 07/18/2021   HGBA1C 5.8 11/28/2020   HGBA1C 6.0 05/25/2020    Lab Results  Component Value Date   CREATININE 1.21 (H) 06/04/2022   CREATININE 0.85 12/05/2021   CREATININE 1.09 11/28/2021    Lab Results  Component Value Date   WBC 3.9 (L) 11/28/2021   HGB 11.8 (L) 11/28/2021   HCT 36.3 11/28/2021   PLT 213.0 11/28/2021   GLUCOSE 69 (L) 06/04/2022   CHOL 131 06/04/2022   TRIG 57 06/04/2022   HDL 75 06/04/2022   LDLDIRECT 92.0 11/19/2015   LDLCALC 43 06/04/2022   ALT 163 (H) 06/04/2022   AST 199 (H) 06/04/2022   NA 140 06/04/2022   K 4.2 06/04/2022   CL 103 06/04/2022   CREATININE 1.21 (H) 06/04/2022   BUN 20 06/04/2022   CO2 26 06/04/2022   TSH 1.33 09/11/2021   HGBA1C 5.4 07/18/2021   MICROALBUR 1.8 12/23/2017    VAS US LE ART SEG MULTI (Segm&LE Reynauds)  Result Date: 04/22/2021  LOWER EXTREMITY DOPPLER STUDY Patient Name:  Hailey Hunt  Date of Exam:   04/22/2021 Medical Rec #: [id number]         Accession #:    [id number] Date of Birth: 10/31/1945         Patient Gender: F Patient Age:   74Y Exam Location:  [facility] Procedure:      VAS US LOWER EXT ART SEG MULTI (SEGMENTALS & LE RAYNAUDS) Referring Phys: [id number] ZACHARY M SMITH --------------------------------------------------------------------------------  Indications: Patient has fibromuscular dysplasia (FMD) and complains of              intermittent foot cramping for 2 years. Patient denies claudication              and rest pain symptoms. High Risk Factors: No history of smoking.  Comparison Study: None Performing Technologist: Mackin, Alecia RVT  Examination Guidelines: A complete evaluation includes at minimum, Doppler waveform  signals and systolic blood pressure reading at the level of bilateral brachial, anterior tibial, and posterior tibial arteries, when vessel segments are accessible. Bilateral testing is considered an integral part of a complete examination. Photoelectric Plethysmograph (PPG) waveforms and toe systolic pressure readings are included as required and additional duplex testing as needed. Limited examinations for reoccurring indications may be performed as noted.  ABI Findings: +---------+------------------+-----+---------+--------+ Right    Rt Pressure (mmHg)IndexWaveform Comment  +---------+------------------+-----+---------+--------+ Brachial 134                                      +---------+------------------+-----+---------+--------+ CFA                               triphasic         +---------+------------------+-----+---------+--------+ Popliteal                       triphasic         +---------+------------------+-----+---------+--------+ ATA      159               1.19 triphasic         +---------+------------------+-----+---------+--------+ PTA      171               1.28 triphasic         +---------+------------------+-----+---------+--------+ PERO     158               1.18 triphasic         +---------+------------------+-----+---------+--------+ Great Toe120               0.90 Normal            +---------+------------------+-----+---------+--------+ +---------+------------------+-----+---------+-------+ Left     Lt Pressure (mmHg)IndexWaveform Comment +---------+------------------+-----+---------+-------+ Brachial 133                                     +---------+------------------+-----+---------+-------+ CFA                             triphasic        +---------+------------------+-----+---------+-------+ Popliteal                       triphasic        +---------+------------------+-----+---------+-------+ ATA      154                1.15 triphasic        +---------+------------------+-----+---------+-------+ PTA      164               1.22 triphasic        +---------+------------------+-----+---------+-------+ PERO     144               1.07 triphasic        +---------+------------------+-----+---------+-------+ Great Toe92                0.69 Normal           +---------+------------------+-----+---------+-------+ +-------+-----------+-----------+------------+------------+ ABI/TBIToday's ABIToday's TBIPrevious ABIPrevious TBI +-------+-----------+-----------+------------+------------+ Right  1.28       .90                                 +-------+-----------+-----------+------------+------------+ Left   1.22       .69                                 +-------+-----------+-----------+------------+------------+   Summary: Right: Resting right ankle-brachial index is within normal range. No evidence of significant right lower extremity arterial disease. The right toe-brachial index is normal. Left: Resting left ankle-brachial index is within normal range. No evidence of significant left lower extremity arterial disease. The left toe-brachial index is abnormal.  *See table(s) above for measurements and observations.  Electronically signed by Quay Burow MD on 04/22/2021 at 5:42:30 PM.    Final     Assessment & Plan:   Problem List Items Addressed This Visit     Benign  secondary hypertension due to renal artery stenosis (HCC)    She has had hypotension on 1.25 mg amlodipine.  Recommend taking it every other day       Relevant Medications   atorvastatin (LIPITOR) 40 MG tablet   Carotid stenosis, asymptomatic, right    Atorvastatin started in early august       Relevant Medications   atorvastatin (LIPITOR) 40 MG tablet   Coagulopathy (HCC)    Previous serologic workup, but she ws advised to defer any elective procedures. .  Referring back to Dr Gaylyn Cheers for follow up      Relevant Orders    Ambulatory referral to Hematology / Oncology   Other Visit Diagnoses     Renovascular hypertension    -  Primary   Relevant Medications   atorvastatin (LIPITOR) 40 MG tablet   Other Relevant Orders   Lipid Panel w/reflex Direct LDL (Completed)   Comp Met (CMET) (Completed)   Factor V deficiency ([place])       Relevant Orders   Ambulatory referral to Hematology / Oncology       I spent a total of 30  minutes with this patient in a face to face visit on the date of this encounter reviewing the last office visit with me, most recent with patient's vascular surgeon at [facility].   patient's diet and eating habits, home blood pressure readings ,  most recent imaging study ,   and post visit ordering of testing and therapeutics.    Follow-up: No follow-ups on file.   Crecencio Mc, MD

## 2022-06-04 NOTE — Telephone Encounter (Signed)
Patient states she would like for Dr. Deborra Medina to know that [employer] has the RSV vaccine.  Patient states she has made an appointment to get her RSV vaccine tomorrow.

## 2022-06-04 NOTE — Patient Instructions (Signed)
Every other day amlodipine trial  We will repeat labs in 3 months

## 2022-06-05 DIAGNOSIS — I6521 Occlusion and stenosis of right carotid artery: Secondary | ICD-10-CM | POA: Insufficient documentation

## 2022-06-05 LAB — LIPID PANEL W/REFLEX DIRECT LDL
Cholesterol: 131 mg/dL (ref <200)
HDL: 75 mg/dL (ref >=50)
LDL Cholesterol (Calc): 43 mg/dL (calc)
Non-HDL Cholesterol (Calc): 56 mg/dL (calc) (ref <130)
Total CHOL/HDL Ratio: 1.7 (calc) (ref <5.0)
Triglycerides: 57 mg/dL (ref <150)

## 2022-06-05 NOTE — Assessment & Plan Note (Signed)
Atorvastatin started in early august .  Stopped today due to elevated liver enzymes  Lab Results  Component Value Date   ALT 163 (H) 06/04/2022   AST 199 (H) 06/04/2022   ALKPHOS 200 (H) 06/04/2022   BILITOT 1.0 06/04/2022

## 2022-06-05 NOTE — Assessment & Plan Note (Signed)
She has had hypotension on 1.25 mg amlodipine.  Recommend taking it every other day

## 2022-06-07 ENCOUNTER — Encounter: Payer: Self-pay | Admitting: Internal Medicine

## 2022-06-07 DIAGNOSIS — Z789 Other specified health status: Secondary | ICD-10-CM | POA: Insufficient documentation

## 2022-06-07 NOTE — Addendum Note (Signed)
Addended by: Crecencio Mc on: 06/07/2022 02:11 PM   Modules accepted: Orders

## 2022-06-07 NOTE — Assessment & Plan Note (Signed)
Liver enzymes are > 4 x normal on atorvastatin.  Advised to stop it immediately ,  And recheck LFTs weekly .

## 2022-06-07 NOTE — Assessment & Plan Note (Signed)
T scores have improved by last DEXA 2022  On Prolia .  No changes today

## 2022-06-10 ENCOUNTER — Telehealth: Payer: Self-pay | Admitting: Internal Medicine

## 2022-06-10 DIAGNOSIS — R748 Abnormal levels of other serum enzymes: Secondary | ICD-10-CM

## 2022-06-10 NOTE — Telephone Encounter (Signed)
Spoke with pt to let her know that the order was changed to a standing order. Pt gave a verbal understanding.

## 2022-06-10 NOTE — Addendum Note (Signed)
Addended by: Adair Laundry on: 06/10/2022 04:32 PM   Modules accepted: Orders

## 2022-06-10 NOTE — Telephone Encounter (Signed)
Patient called and stated she is suppose to have labs weekly. Does there need to be orders in or a standing order?

## 2022-06-11 ENCOUNTER — Other Ambulatory Visit (INDEPENDENT_AMBULATORY_CARE_PROVIDER_SITE_OTHER): Payer: Medicare Other

## 2022-06-11 DIAGNOSIS — R748 Abnormal levels of other serum enzymes: Secondary | ICD-10-CM

## 2022-06-11 LAB — COMPREHENSIVE METABOLIC PANEL
ALT: 195 U/L — ABNORMAL HIGH (ref 0–35)
AST: 167 U/L — ABNORMAL HIGH (ref 0–37)
Albumin: 3.8 g/dL (ref 3.5–5.2)
Alkaline Phosphatase: 381 U/L — ABNORMAL HIGH (ref 39–117)
BUN: 20 mg/dL (ref 6–23)
CO2: 25 mEq/L (ref 19–32)
Calcium: 8.9 mg/dL (ref 8.4–10.5)
Chloride: 107 mEq/L (ref 96–112)
Creatinine, Ser: 0.98 mg/dL (ref 0.40–1.20)
GFR: 57.13 mL/min — ABNORMAL LOW (ref >60.00)
Glucose, Bld: 91 mg/dL (ref 70–99)
Potassium: 4.1 mEq/L (ref 3.5–5.1)
Sodium: 142 mEq/L (ref 135–145)
Total Bilirubin: 1.4 mg/dL — ABNORMAL HIGH (ref 0.2–1.2)
Total Protein: 6.1 g/dL (ref 6.0–8.3)

## 2022-06-13 ENCOUNTER — Other Ambulatory Visit: Payer: Medicare Other

## 2022-06-13 ENCOUNTER — Telehealth: Payer: Self-pay | Admitting: Internal Medicine

## 2022-06-13 ENCOUNTER — Encounter: Payer: Self-pay | Admitting: Internal Medicine

## 2022-06-13 NOTE — Telephone Encounter (Signed)
Patient called and wanted Dr Derrel Nip to see MyChart below. She also has labs scheduled here at office next week.   Also, her daughter has surgery next Tuesday, 06/17/2022 and will not be available that day.

## 2022-06-13 NOTE — Addendum Note (Signed)
Addended by: Crecencio Mc on: 06/13/2022 01:14 PM   Modules accepted: Orders

## 2022-06-13 NOTE — Telephone Encounter (Signed)
Lft pt vm to call [phone] press option 3 and then 2 to sch . Thanks

## 2022-06-16 ENCOUNTER — Ambulatory Visit
Admission: RE | Admit: 2022-06-16 | Discharge: 2022-06-16 | Disposition: A | Discharge status: Home / Self Care | Payer: Medicare Other | Source: Ambulatory Visit | Attending: Internal Medicine | Admitting: Internal Medicine

## 2022-06-16 DIAGNOSIS — R748 Abnormal levels of other serum enzymes: Secondary | ICD-10-CM | POA: Diagnosis not present

## 2022-06-16 DIAGNOSIS — R945 Abnormal results of liver function studies: Secondary | ICD-10-CM | POA: Diagnosis not present

## 2022-06-17 ENCOUNTER — Other Ambulatory Visit: Payer: Self-pay | Admitting: Internal Medicine

## 2022-06-17 DIAGNOSIS — R748 Abnormal levels of other serum enzymes: Secondary | ICD-10-CM

## 2022-06-17 DIAGNOSIS — R7401 Elevation of levels of liver transaminase levels: Secondary | ICD-10-CM

## 2022-06-18 ENCOUNTER — Other Ambulatory Visit (INDEPENDENT_AMBULATORY_CARE_PROVIDER_SITE_OTHER): Payer: Medicare Other

## 2022-06-18 DIAGNOSIS — R748 Abnormal levels of other serum enzymes: Secondary | ICD-10-CM | POA: Diagnosis not present

## 2022-06-18 LAB — IBC + FERRITIN
Ferritin: 60.6 ng/mL (ref 10.0–291.0)
Iron: 98 ug/dL (ref 42–145)
Saturation Ratios: 26.1 % (ref 20.0–50.0)
TIBC: 375.2 ug/dL (ref 250.0–450.0)
Transferrin: 268 mg/dL (ref 212.0–360.0)

## 2022-06-18 LAB — HEPATIC FUNCTION PANEL
ALT: 65 U/L — ABNORMAL HIGH (ref 0–35)
AST: 39 U/L — ABNORMAL HIGH (ref 0–37)
Albumin: 4.2 g/dL (ref 3.5–5.2)
Alkaline Phosphatase: 235 U/L — ABNORMAL HIGH (ref 39–117)
Bilirubin, Direct: 0.3 mg/dL (ref 0.0–0.3)
Total Bilirubin: 0.9 mg/dL (ref 0.2–1.2)
Total Protein: 6.7 g/dL (ref 6.0–8.3)

## 2022-06-19 LAB — ANTI-SMITH ANTIBODY: ENA SM Ab Ser-aCnc: <1 AI

## 2022-06-20 NOTE — Addendum Note (Signed)
Addended by: Crecencio Mc on: 06/20/2022 12:56 PM   Modules accepted: Orders

## 2022-06-22 LAB — HEPATITIS B CORE ANTIBODY, TOTAL: Hep B Core Total Ab: NONREACTIVE

## 2022-06-22 LAB — ANA: Anti Nuclear Antibody (ANA): NEGATIVE

## 2022-06-22 LAB — CERULOPLASMIN: Ceruloplasmin: 28 mg/dL (ref 18–53)

## 2022-06-22 LAB — HEPATITIS B SURFACE ANTIGEN: Hepatitis B Surface Ag: NONREACTIVE

## 2022-06-22 LAB — ANTI-SMOOTH MUSCLE ANTIBODY, IGG: Actin (Smooth Muscle) Antibody (IGG): <20 U (ref <20)

## 2022-06-22 LAB — ANTI-MICROSOMAL ANTIBODY LIVER / KIDNEY: LKM1 Ab: <=20 U (ref <=20.0)

## 2022-06-22 LAB — HEPATITIS C ANTIBODY: Hepatitis C Ab: NONREACTIVE

## 2022-06-22 LAB — HEPATITIS B SURFACE ANTIBODY,QUALITATIVE: Hep B S Ab: NONREACTIVE

## 2022-06-22 LAB — ALPHA-1-ANTITRYPSIN: A-1 Antitrypsin, Ser: 152 mg/dL (ref 83–199)

## 2022-06-24 DIAGNOSIS — Z681 Body mass index (BMI) 19 or less, adult: Secondary | ICD-10-CM | POA: Diagnosis not present

## 2022-06-24 DIAGNOSIS — R7401 Elevation of levels of liver transaminase levels: Secondary | ICD-10-CM | POA: Diagnosis not present

## 2022-06-24 DIAGNOSIS — R748 Abnormal levels of other serum enzymes: Secondary | ICD-10-CM | POA: Diagnosis not present

## 2022-06-25 ENCOUNTER — Other Ambulatory Visit (INDEPENDENT_AMBULATORY_CARE_PROVIDER_SITE_OTHER): Payer: Medicare Other

## 2022-06-25 DIAGNOSIS — R748 Abnormal levels of other serum enzymes: Secondary | ICD-10-CM

## 2022-06-25 LAB — COMPREHENSIVE METABOLIC PANEL
ALT: 28 U/L (ref 0–35)
AST: 29 U/L (ref 0–37)
Albumin: 4 g/dL (ref 3.5–5.2)
Alkaline Phosphatase: 144 U/L — ABNORMAL HIGH (ref 39–117)
BUN: 16 mg/dL (ref 6–23)
CO2: 27 mEq/L (ref 19–32)
Calcium: 9 mg/dL (ref 8.4–10.5)
Chloride: 108 mEq/L (ref 96–112)
Creatinine, Ser: 0.92 mg/dL (ref 0.40–1.20)
GFR: 61.61 mL/min (ref >60.00)
Glucose, Bld: 95 mg/dL (ref 70–99)
Potassium: 4.4 mEq/L (ref 3.5–5.1)
Sodium: 142 mEq/L (ref 135–145)
Total Bilirubin: 0.8 mg/dL (ref 0.2–1.2)
Total Protein: 6.3 g/dL (ref 6.0–8.3)

## 2022-06-26 ENCOUNTER — Encounter: Payer: Self-pay | Admitting: Internal Medicine

## 2022-07-09 ENCOUNTER — Other Ambulatory Visit: Payer: Self-pay | Admitting: Internal Medicine

## 2022-07-09 DIAGNOSIS — E559 Vitamin D deficiency, unspecified: Secondary | ICD-10-CM

## 2022-07-09 DIAGNOSIS — E782 Mixed hyperlipidemia: Secondary | ICD-10-CM

## 2022-07-16 DIAGNOSIS — Z681 Body mass index (BMI) 19 or less, adult: Secondary | ICD-10-CM | POA: Diagnosis not present

## 2022-07-16 DIAGNOSIS — Z880 Allergy status to penicillin: Secondary | ICD-10-CM | POA: Diagnosis not present

## 2022-07-16 DIAGNOSIS — E0500 Thyrotoxicosis with diffuse goiter without thyrotoxic crisis or storm: Secondary | ICD-10-CM | POA: Diagnosis not present

## 2022-07-16 DIAGNOSIS — D689 Coagulation defect, unspecified: Secondary | ICD-10-CM | POA: Diagnosis not present

## 2022-07-16 DIAGNOSIS — D682 Hereditary deficiency of other clotting factors: Secondary | ICD-10-CM | POA: Diagnosis not present

## 2022-07-16 DIAGNOSIS — I773 Arterial fibromuscular dysplasia: Secondary | ICD-10-CM | POA: Diagnosis not present

## 2022-07-16 DIAGNOSIS — N182 Chronic kidney disease, stage 2 (mild): Secondary | ICD-10-CM | POA: Diagnosis not present

## 2022-07-16 DIAGNOSIS — I129 Hypertensive chronic kidney disease with stage 1 through stage 4 chronic kidney disease, or unspecified chronic kidney disease: Secondary | ICD-10-CM | POA: Diagnosis not present

## 2022-07-16 DIAGNOSIS — Z7982 Long term (current) use of aspirin: Secondary | ICD-10-CM | POA: Diagnosis not present

## 2022-07-16 DIAGNOSIS — Z79899 Other long term (current) drug therapy: Secondary | ICD-10-CM | POA: Diagnosis not present

## 2022-07-16 DIAGNOSIS — D6859 Other primary thrombophilia: Secondary | ICD-10-CM | POA: Diagnosis not present

## 2022-07-24 ENCOUNTER — Encounter: Payer: Self-pay | Admitting: Internal Medicine

## 2022-07-24 ENCOUNTER — Ambulatory Visit (INDEPENDENT_AMBULATORY_CARE_PROVIDER_SITE_OTHER): Payer: Medicare Other | Admitting: Internal Medicine

## 2022-07-24 VITALS — BP 104/72 | HR 86 | Ht 60.0 in | Wt 85.4 lb

## 2022-07-24 DIAGNOSIS — M8889 Osteitis deformans of multiple sites: Secondary | ICD-10-CM | POA: Diagnosis not present

## 2022-07-24 DIAGNOSIS — E890 Postprocedural hypothyroidism: Secondary | ICD-10-CM

## 2022-07-24 DIAGNOSIS — R7303 Prediabetes: Secondary | ICD-10-CM

## 2022-07-24 DIAGNOSIS — M810 Age-related osteoporosis without current pathological fracture: Secondary | ICD-10-CM | POA: Diagnosis not present

## 2022-07-24 DIAGNOSIS — I150 Renovascular hypertension: Secondary | ICD-10-CM | POA: Diagnosis not present

## 2022-07-24 DIAGNOSIS — I701 Atherosclerosis of renal artery: Secondary | ICD-10-CM | POA: Diagnosis not present

## 2022-07-24 LAB — TSH: TSH: 1.01 u[IU]/mL (ref 0.35–5.50)

## 2022-07-24 LAB — HEMOGLOBIN A1C: Hgb A1c MFr Bld: 5.9 % (ref 4.6–6.5)

## 2022-07-24 LAB — T4, FREE: Free T4: 0.98 ng/dL (ref 0.60–1.60)

## 2022-07-24 NOTE — Patient Instructions (Addendum)
Please continue Synthroid 50 mcg daily.  Take the thyroid hormone every day, with water, at least 30 minutes before breakfast, separated by at least 4 hours from: - acid reflux medications - calcium - iron - multivitamins  Please come back for a follow-up appointment in 1 year.

## 2022-07-24 NOTE — Progress Notes (Signed)
Patient ID: ELYSIA Hunt, female   DOB: 1946-03-25, 76 y.o.   MRN: [id number]  HPI  Hailey Hunt is a 76 y.o.-year-old female, returning for follow-up for Paget ds, osteopenia/osteoporosis, postablative hypothyroidism, prediabetes.  Last visit 1 year ago.  Interim history: No falls or fractures since last visit.  No bone pain. She continues to walk 5 mi every day - in am. No hip pain or other discomfort. She was dx'ed with atherosclerosis at [facility] >> started Lipitor. 1 month ago, she had increased LFTs, extensively investigated by PCP, but they started to improve. She also saw hepatology. She is currently off the vitamin D after the LFTs returned increased. Her weight dropped 11 lbs as she was not able to eat well recently.  Reviewed history: She had a CTA abd/pelvis on 06/2015:incidental lesion:  Diffuse patchy sclerosis and cortical thickening of the left iliac bone, favored to represent Paget's disease.  She has had an anteverted L femur since her 76s. She has had pain in that his ever since.    Bone scan (01/02/2016): FINDINGS: There is asymmetric increased uptake throughout the left iliac bone. The appearance and distribution is compatible with Paget's disease. Increased uptake in the distribution of the nasal bone is also noted, nonspecific. This could be seen with Paget's disease, fibrous dysplasia or chronic sinusitis. No findings identified to suggest bone metastases. Physiologic uptake is noted within the kidneys and urinary bladder.   IMPRESSION: 1. Increased uptake within the left iliac bone compatible with Paget's disease. 2. Central area of increased uptake in the region of the nasal bone which may also reflect Paget's disease of the skull.   Per alkaline phosphatase levels are in the normal range: Lab Results  Component Value Date   ALKPHOS 144 (H) 06/25/2022   ALKPHOS 235 (H) 06/18/2022   ALKPHOS 381 (H) 06/11/2022   ALKPHOS 200 (H) 06/04/2022   ALKPHOS 64  11/28/2021   ALKPHOS 60 05/31/2021   ALKPHOS 55 11/28/2020   ALKPHOS 62 05/25/2020   ALKPHOS 78 05/23/2019   ALKPHOS 80 11/26/2018   ALKPHOS 71 11/26/2018   Recent LFTs: Component     Latest Ref Rng 06/04/2022 06/11/2022 06/18/2022 06/25/2022  Total Bilirubin     0.2 - 1.2 mg/dL 1.0  1.4 (H)  0.9  0.8   Alkaline Phosphatase     39 - 117 U/L 200 (H)  381 (H)  235 (H)  144 (H)   AST     0 - 37 U/L 199 (H)  167 (H)  39 (H)  29   ALT     0 - 35 U/L 163 (H)  195 (H)  65 (H)  28    Osteopenia/osteoporosis:  Reviewed previous bone density scan reports:  12/30/2020 ([place]) Lumbar spine L1-L4 Femoral neck (FN) 33% distal radius   -0.1 RFN: -2.1 LFN: -2.1 n/a  Change in BMD from previous DXA test (%) +4.2%* +4.0% n/a  (*) statistically significant  Date L1-L4 T score FN T scores FRAX score  12/21/2018 ([place]) -0.5 (-1.6%) RFN: -2.4 LFN: -2.2  (-3%) 10-year MOF: 10.8% 10-year hip fracture risk: 3  12/16/2016 ([place]) -0.40 RFN:-2.2 LFN:-2.1   01/22/2012 ([facility])  -0.60 LFN: -0.66     She had a traumatic vertebral fracture as a child.  She has a history of dizziness, but not recently.  Previous osteoporosis treatments: - Actonel for 2 years. This was apparently stopped due to recurrent kidney stones -last episode 2010.  She did not feel good  on this (stomach upset, weakness, fatigue). - Prolia - 08/10/2019, 02/08/2020, 08/30/2020, 03/08/2021, 09/11/2021, 03/28/2022.  Latest vitamin D levels have been normal, she previously had a low level in 2013: Lab Results  Component Value Date   VD25OH 71.44 11/28/2021   VD25OH 43.19 05/31/2021   VD25OH 52.64 11/28/2020   VD25OH 38.5 07/18/2020   VD25OH 55.06 12/30/2018   VD25OH 60.99 02/02/2018   VD25OH 55.42 07/13/2017   VD25OH 49.37 12/12/2016   VD25OH 49.43 11/19/2015   VD25OH 46.46 08/14/2014   She has been on 1000 units vitamin D daily for years - then a MVI. Now off.  She continues to walk. In the past, During this walks, she  talks on the telephone with her sister in [place], who is also walking.  She also has an apple watch and completes all the circles every day.  She had surgical menopause at 76 years old.   Pt does not have a FH of osteoporosis.  No history of hypo or hypercalcemia or hyperparathyroidism: Lab Results  Component Value Date   PTH 38 05/31/2021   CALCIUM 9.0 06/25/2022   CALCIUM 8.9 06/11/2022   CALCIUM 9.3 06/04/2022   CALCIUM 9.0 12/05/2021   CALCIUM 9.5 11/28/2021   CALCIUM 9.7 05/31/2021   CALCIUM 9.8 05/31/2021   CALCIUM 9.4 11/28/2020   CALCIUM 9.3 05/25/2020   CALCIUM 9.8 07/01/2019   She has a history of Graves' disease status post RAI treatment in the 43s.  She developed post ablative hypothyroidism.  Pt is on Synthroid 50 mcg daily, taken: - in am (at 4:30 am - then goes back to sleep) - fasting - at least 30 min from b'fast - no Ca, Fe, MVI, PPIs - not on Biotin  On vitamin B12.  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; latest BUN/creatinine: Lab Results  Component Value Date   BUN 16 06/25/2022   CREATININE 0.92 06/25/2022   Prediabetes: Reviewed her HbA1c levels: Lab Results  Component Value Date   HGBA1C 5.4 07/18/2021   HGBA1C 5.8 11/28/2020   HGBA1C 6.0 05/25/2020   HGBA1C 5.9 05/23/2019   HGBA1C 5.8 11/26/2018   HGBA1C 5.8 08/17/2018   HGBA1C 6.2 05/10/2018   HGBA1C 5.3 12/21/2017   HGBA1C 5.9 12/20/2017   HGBA1C 5.3 12/19/2017   HGBA1C 5.3 12/19/2017   Investigation for type 1 diabetes was negative: Component     Latest Ref Rng & Units 06/30/2018  C-Peptide     0.80 - 3.85 ng/mL 2.45  Glucose, Plasma     65 - 99 mg/dL 93  ZNT8 Antibodies      negative  Glutamic Acid Decarb Ab     <5 IU/mL <5  Islet Cell Ab     Neg:<1:1 Negative    ROS: + See HPI  I reviewed pt's  medications, allergies, PMH, social hx, family hx, and changes were documented in the history of present illness. Otherwise, unchanged from my initial visit note.  Past Medical History:  Diagnosis Date   Aberrant right subclavian artery    Cataract    surgery   Clotting disorder (HCC)    hx of bleeding after vaginal delivery, dr told her not to have any surgeries that are necessary   Dysplasia, artery, fibromuscular, renal ([place])    Fibromuscular dysplasia (  [place])    Fibromuscular dysplasia of renal artery ([place])    managed by Dr. 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HCC)    Dr Mervyn Skeeters   Paget's disease of the bone    Pernicious anemia    Thyroid disease    Urinary incontinence, mixed    resolved with removal of ained sutures in bladder    Past Surgical History:  Procedure Laterality Date   BILATERAL SALPINGOOPHORECTOMY     CATARACT EXTRACTION  Sep 10, 2011, Jan 13,2013   COLONOSCOPY     RENAL BIOPSY     TONSILLECTOMY     VAGINAL DELIVERY     x2   VAGINAL HYSTERECTOMY     WISDOM TOOTH EXTRACTION     Social History   Socioeconomic History   Marital status: Married    Spouse name: Not on file   Number of children: 2   Years of education: Not on file   Highest education level: Not on file  Occupational History   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  Tobacco Use   Smoking status: Never   Smokeless tobacco: Never  Vaping Use   Vaping Use: Never used  Substance and Sexual Activity   Alcohol use: Yes    Comment: once a year   Drug use: No   Sexual activity: Not Currently  Other Topics Concern   Not on file  Social History Narrative   Not on file   Social Determinants of Health   Financial Resource Strain: Low Risk  (05/20/2022)   Overall Financial Resource Strain (CARDIA)    Difficulty of Paying Living Expenses: Not hard at all  Food  Insecurity: No Food Insecurity (05/20/2022)   Hunger Vital Sign    Worried About Running Out of Food in the Last Year: Never true    [place] in the Last Year: Never true  Transportation Needs: No Transportation Needs (05/20/2022)   PRAPARE - Transportation    Lack of Transportation (Medical): No    Lack of Transportation (Non-Medical): No  Physical Activity: Sufficiently Active (05/20/2022)   Exercise Vital Sign    Days of Exercise per Week: 7 days    Minutes of Exercise per Session: 60 min  Stress: No Stress Concern Present (05/20/2022)   [place]    Feeling of Stress : Not at all  Social Connections: Unknown (05/20/2022)   Social Connection and Isolation Panel [NHANES]    Frequency of Communication with Friends and Family: Not on file    Frequency of Social Gatherings with Friends and Family: Not on file    Attends Religious Services: Not on file    Active Member of Clubs or Organizations: Not on file    Attends Archivist Meetings: Not on file    Marital Status: Married  Intimate Partner Violence: Not At Risk (05/20/2022)   Humiliation, Afraid, Rape, and Kick questionnaire    Fear of Current or Ex-Partner: No    Emotionally Abused: No    Physically Abused: No    Sexually Abused: No   Current Outpatient Medications on File Prior to Visit  Medication Sig Dispense Refill   amLODipine (NORVASC) 2.5 MG tablet Take 1 tablet (2.5 mg total) by mouth daily. (Patient taking differently: Take 2.5 mg by mouth  daily. Taking 1/2 tablet daily) 90 tablet 3   ASPIRIN 81 PO      cholecalciferol (VITAMIN D) 1000 UNITS tablet Take 1,000 Units by mouth daily.     denosumab (PROLIA) 60 MG/ML SOSY injection      montelukast (SINGULAIR) 10 MG tablet Take 1 tablet (10 mg total) by mouth at bedtime. 30 tablet 3   Multiple Vitamins-Minerals (CENTRUM SILVER 50+WOMEN PO) Take by mouth.     omeprazole (PRILOSEC) 40 MG capsule  Take 1 capsule (40 mg total) by mouth daily. (Patient taking differently: Take 40 mg by mouth as needed.) 30 capsule 3   SYNTHROID 50 MCG tablet TAKE ONE TABLET BY MOUTH DAILY ON AN EMPTY STOMACH WITH A FULL GLASS OF WATER AT LEAST 30-60 MINUTES BEFORE BREAKFAST 90 tablet 1   traMADol (ULTRAM) 50 MG tablet Take 1 tablet (50 mg total) by mouth every 8 (eight) hours as needed. 30 tablet 3   tretinoin (RETIN-A) 0.025 % cream APPLY TOPICALLY EVERY NIGHT AT BEDTIME 45 g 1   No current facility-administered medications on file prior to visit.   Allergies  Allergen Reactions   Voltaren [Diclofenac] Hypertension   Adhesive [Tape]    Demerol     HYPOTENSION   Macrodantin    Nsaids Hypertension   Voltaren [Diclofenac Sodium] Other (See Comments)    FMD, increased bp   Codeine Nausea And Vomiting and Rash   Nickel Rash   Penicillins Rash   Sulfa Antibiotics Rash    Fever   Family History  Problem Relation Age of Onset   Anuerysm Mother    COPD Mother    Hypertension Mother    Heart disease Mother    Heart failure Mother    Cancer Father        lung CA   Heart disease Father    Heart attack Father    Heart disease Brother        viral cardiomyopathy, cocaine use   Drug abuse Brother    Cancer Maternal Aunt        esophageal and live rCA,  hisotory of  ETOH and tobacco   Anuerysm Maternal Grandfather    Cancer Paternal Grandmother        brain tumor   Diabetes Sister    Colon cancer Paternal Uncle    PE: BP 104/72 (BP Location: Left Arm, Patient Position: Sitting, Cuff Size: Normal)   Pulse 86   Ht 5' (1.524 m)   Wt 85 lb 6.4 oz (38.7 kg)   SpO2 95%   BMI 16.68 kg/m   Wt Readings from Last 3 Encounters:  07/24/22 85 lb 6.4 oz (38.7 kg)  06/04/22 88 lb 6.4 oz (40.1 kg)  05/20/22 96 lb (43.5 kg)   Constitutional: thin, in NAD Eyes:  EOMI, no exophthalmos ENT: no thyromegaly, no cervical lymphadenopathy Cardiovascular: RRR, No MRG Respiratory: CTA B Musculoskeletal: no  deformities Skin: moist, warm, no rashes Neurological: + tremor with outstretched hands (chronic)  Assessment: 1. Paget disease  2.  Clinical osteoporosis  3. Postablative hypothyroidism - RAI tx for Graves ds. In 1980s  4.  Prediabetes  Plan: 1. Paget ds. -Stable, without new pain or warmth at the site of her pagetoid lesions -She initially had a lesion in the left hip observed incidentally on pelvic CTA at [facility]. Her FMD is managed at [facility].  This was again noticed on bone scan from 2017, along with a nasal focus.  She did not have indication of new  lesions since then. -We discussed that Paget's disease lesions do not need to be treated unless they are painful, contiguous with the joint, or in weightbearing joints.  Also, consideration for treatment should be given in case alkaline phosphatase increases.  Her alkaline phosphatase levels have been normal, but since last visit, after starting a statin, she had an increase in her LFTs, which was further extensively investigated with negative results.  I reviewed the latest set of LFTs and they started to improve.  She is feeling much better off the statin. -We discussed the severe complications of Paget disease including bone cancer, fractures, heart failure are very rare nowadays especially in the case as mild as hers -She wanted to avoid bisphosphonates due to previous intolerance and she is now on Prolia (see below)  2.  Clinical osteoporosis -In 2020, bone density scan showed slightly worse bone density scores at the femoral necks-however, this did not reach statistical significance.  However, she had another bone density scan in 12/2020 and this showed significant improvement of BMD at the level of the spine and also improvement at the hips, but this did not reach statistical significance. -Her FRAX score was high, confirming clinical osteoporosis, for which treatment was indicated -She started Prolia 07/2019.  She had 6 doses so far, with  the latest being 03/28/2022 -due for another injection in 2 months -She tolerates Prolia well, without jaw/thigh/hip pain -She was on 1000 units vitamin D daily but she was taken off during investigation for liver disease.  She has a vitamin D level check pending for 08/2021 with PCP -Vitamin D level was normal in 05/2021  3. Postablative hypothyroidism - latest thyroid labs reviewed with pt. >> normal: Lab Results  Component Value Date   TSH 1.33 09/11/2021  - she continues on LT4 50 mcg daily - pt feels good on this dose. - we discussed about taking the thyroid hormone every day, with water, >30 minutes before breakfast, separated by >4 hours from acid reflux medications, calcium, iron, multivitamins. Pt. is taking it correctly. - will check thyroid tests today: TSH and fT4 - If labs are abnormal, she will need to return for repeat TFTs in 1.5 months  4.  Prediabetes -HbA1c levels fluctuate between the normal range and prediabetic range -Highest HbA1c was 6.0%, however, at last visit, this was 5.4%, at goal -She continues to adjust her diet by cutting out fast foods, concentrated sweets, and starches. -Investigation for type 1 diabetes returned negative -For now, no pharmaceutical intervention is needed.  Continue diet and exercise.   Component     Latest Ref Rng 07/24/2022  Hemoglobin A1C     4.6 - 6.5 % 5.9   TSH     0.35 - 5.50 uIU/mL 1.01   T4,Free(Direct)     0.60 - 1.60 ng/dL 0.98    Patient A1c slightly higher than before, but still lower in the prediabetic range.  TFTs are normal.  Philemon Kingdom, MD PhD [facility]

## 2022-08-18 NOTE — Telephone Encounter (Signed)
Prolia VOB initiated via [email or web address]  Last Prolia inj 03/28/22 Next Prolia inj due 09/28/22

## 2022-08-25 DIAGNOSIS — I10 Essential (primary) hypertension: Secondary | ICD-10-CM | POA: Diagnosis not present

## 2022-09-01 ENCOUNTER — Other Ambulatory Visit (INDEPENDENT_AMBULATORY_CARE_PROVIDER_SITE_OTHER): Payer: Medicare Other

## 2022-09-01 DIAGNOSIS — E559 Vitamin D deficiency, unspecified: Secondary | ICD-10-CM | POA: Diagnosis not present

## 2022-09-01 DIAGNOSIS — E782 Mixed hyperlipidemia: Secondary | ICD-10-CM | POA: Diagnosis not present

## 2022-09-01 LAB — LIPID PANEL
Cholesterol: 183 mg/dL (ref 0–200)
HDL: 88.7 mg/dL (ref >39.00)
LDL Cholesterol: 82 mg/dL (ref 0–99)
NonHDL: 94.72
Total CHOL/HDL Ratio: 2
Triglycerides: 64 mg/dL (ref 0.0–149.0)
VLDL: 12.8 mg/dL (ref 0.0–40.0)

## 2022-09-01 LAB — COMPREHENSIVE METABOLIC PANEL
ALT: 17 U/L (ref 0–35)
AST: 26 U/L (ref 0–37)
Albumin: 4.2 g/dL (ref 3.5–5.2)
Alkaline Phosphatase: 66 U/L (ref 39–117)
BUN: 15 mg/dL (ref 6–23)
CO2: 28 mEq/L (ref 19–32)
Calcium: 8.7 mg/dL (ref 8.4–10.5)
Chloride: 107 mEq/L (ref 96–112)
Creatinine, Ser: 0.95 mg/dL (ref 0.40–1.20)
GFR: 59.2 mL/min — ABNORMAL LOW (ref >60.00)
Glucose, Bld: 90 mg/dL (ref 70–99)
Potassium: 4 mEq/L (ref 3.5–5.1)
Sodium: 141 mEq/L (ref 135–145)
Total Bilirubin: 0.6 mg/dL (ref 0.2–1.2)
Total Protein: 6.1 g/dL (ref 6.0–8.3)

## 2022-09-01 LAB — VITAMIN D 25 HYDROXY (VIT D DEFICIENCY, FRACTURES): VITD: 55.86 ng/mL (ref 30.00–100.00)

## 2022-09-02 DIAGNOSIS — R3129 Other microscopic hematuria: Secondary | ICD-10-CM | POA: Diagnosis not present

## 2022-09-02 DIAGNOSIS — I773 Arterial fibromuscular dysplasia: Secondary | ICD-10-CM | POA: Diagnosis not present

## 2022-09-02 DIAGNOSIS — I10 Essential (primary) hypertension: Secondary | ICD-10-CM | POA: Diagnosis not present

## 2022-09-03 ENCOUNTER — Other Ambulatory Visit: Payer: Medicare Other

## 2022-09-10 NOTE — Telephone Encounter (Signed)
MyChart messgae sent to patient. 

## 2022-09-11 ENCOUNTER — Encounter: Payer: Self-pay | Admitting: Internal Medicine

## 2022-09-12 NOTE — Telephone Encounter (Signed)
Updated chart? Does pt need to get the Prevnar 20 vaccine?

## 2022-09-13 ENCOUNTER — Encounter: Payer: Self-pay | Admitting: Internal Medicine

## 2022-10-16 NOTE — Telephone Encounter (Addendum)
Secondary coverage BCBS: COVERAGE DETAILS: Please note that your patient's secondary coverage with [employer] [place]  terminated on 09/28/2022. The payer confirmed that there is not any other active coverage on file.

## 2022-10-16 NOTE — Telephone Encounter (Signed)
Insurance updated, VOB resubmitted

## 2022-10-22 NOTE — Telephone Encounter (Signed)
Pt ready for scheduling on or after 09/29/22  Out-of-pocket cost due at time of visit: $0  Primary: Medicare Prolia co-insurance: 20% (approximately $302) Admin fee co-insurance: 20% (approximately $25)  Deductible: $0 of $240  Secondary: BCBS [place] Supp Plan [place] Prolia co-insurance: Covers Medicare Part B co-insurance Admin fee co-insurance: Covers Medicare Part B co-insurance  Deductible: Secondary covered Medicare deductible  Prior Auth: NOT required PA# Valid:   ** This summary of benefits is an estimation of the patient's out-of-pocket cost. Exact cost may vary based on individual plan coverage.

## 2022-10-29 ENCOUNTER — Ambulatory Visit (INDEPENDENT_AMBULATORY_CARE_PROVIDER_SITE_OTHER): Payer: Medicare Other

## 2022-10-29 VITALS — BP 104/68 | Ht 60.0 in | Wt 89.6 lb

## 2022-10-29 DIAGNOSIS — M810 Age-related osteoporosis without current pathological fracture: Secondary | ICD-10-CM | POA: Diagnosis not present

## 2022-10-29 MED ORDER — DENOSUMAB 60 MG/ML ~~LOC~~ SOSY
60.0000 mg | PREFILLED_SYRINGE | Freq: Once | SUBCUTANEOUS | Status: AC
  Administered 2022-10-29: 60 mg via SUBCUTANEOUS

## 2022-10-29 NOTE — Progress Notes (Signed)
After obtaining consent, and per orders of Dr. Cruzita Lederer, injection of Prolia '60MG'$  given by Revella Shelton L Reyli Schroth left arm SQ. Patient instructed to remain in clinic for 20 minutes afterwards, and to report any adverse reaction to me immediately.

## 2022-10-30 ENCOUNTER — Ambulatory Visit (INDEPENDENT_AMBULATORY_CARE_PROVIDER_SITE_OTHER): Payer: Medicare Other | Admitting: Internal Medicine

## 2022-10-30 ENCOUNTER — Encounter: Payer: Self-pay | Admitting: Internal Medicine

## 2022-10-30 VITALS — BP 124/86 | HR 60 | Temp 98.0°F | Resp 14 | Ht 60.0 in | Wt 88.6 lb

## 2022-10-30 DIAGNOSIS — E782 Mixed hyperlipidemia: Secondary | ICD-10-CM | POA: Diagnosis not present

## 2022-10-30 DIAGNOSIS — R748 Abnormal levels of other serum enzymes: Secondary | ICD-10-CM

## 2022-10-30 DIAGNOSIS — I6521 Occlusion and stenosis of right carotid artery: Secondary | ICD-10-CM | POA: Diagnosis not present

## 2022-10-30 DIAGNOSIS — E11649 Type 2 diabetes mellitus with hypoglycemia without coma: Secondary | ICD-10-CM

## 2022-10-30 DIAGNOSIS — R7303 Prediabetes: Secondary | ICD-10-CM | POA: Diagnosis not present

## 2022-10-30 DIAGNOSIS — I773 Arterial fibromuscular dysplasia: Secondary | ICD-10-CM

## 2022-10-30 MED ORDER — FREESTYLE LIBRE 3 SENSOR MISC: 0 refills | Status: DC

## 2022-10-30 NOTE — Progress Notes (Signed)
Subjective:  Patient ID: Hailey Hunt, female    DOB: 1946/04/24  Age: 77 y.o. MRN: [id number]  CC: The primary encounter diagnosis was Carotid stenosis, asymptomatic, right. Diagnoses of Mixed hyperlipidemia, Elevated liver enzymes, Hypoglycemia associated with diabetes ([place]), Fibromuscular dysplasia ([place]), and Prediabetes were also pertinent to this visit.   HPI Hailey Hunt presents for  Chief Complaint  Patient presents with   Medical Management of Chronic Issues   Hyperlipidemia    Discuss statin therapy    1) reviewed treatment of hyperlipidemia recommended by her FMD specialist for management of Carotid stenosis and one functioning vertebral artery  by CTA done in September at [facility].  She has a history of elevated LFTs  (4 x ULN)  on prior atorvastatin trial in September    She is Concerned about  having an elevated LPA level , which was obtained  remotely by [employer] , her former PCP  and has Joined a study sponsored by [employer].   LPA was 10 . Does not want to see Donneta Romberg  (FMD specialists) last August who placed her on statin ([facility])  ; wants to see a lipid specialist  2) Prediabetes: has been on a very restrictive diet , causing  weight loss to the point of  being underweight , and has a   history of recurrent hypoglycemia  with sugars in the 60's.  She has never had the benefit of using a  CBG monitor .  FreeStyle Libre 3 monitor discussed   Diet reviewed:  eating oatmeal ,  cranberries, using  KETO creamer .  Eating probiotic prunes,  walking 5 miles daily.    Outpatient Medications Prior to Visit  Medication Sig Dispense Refill   amLODipine (NORVASC) 2.5 MG tablet Take 1 tablet (2.5 mg total) by mouth daily. (Patient taking differently: Take 2.5 mg by mouth daily. Taking 1/2 tablet daily) 90 tablet 3   ASPIRIN 81 PO      cholecalciferol (VITAMIN D) 1000 UNITS tablet Take 1,000 Units by mouth daily.     denosumab (PROLIA) 60 MG/ML SOSY injection      Multiple  Vitamins-Minerals (CENTRUM SILVER 50+WOMEN PO) Take by mouth.     SYNTHROID 50 MCG tablet TAKE ONE TABLET BY MOUTH DAILY ON AN EMPTY STOMACH WITH A FULL GLASS OF WATER AT LEAST 30-60 MINUTES BEFORE BREAKFAST 90 tablet 1   traMADol (ULTRAM) 50 MG tablet Take 1 tablet (50 mg total) by mouth every 8 (eight) hours as needed. 30 tablet 3   tretinoin (RETIN-A) 0.025 % cream APPLY TOPICALLY EVERY NIGHT AT BEDTIME 45 g 1   montelukast (SINGULAIR) 10 MG tablet Take 1 tablet (10 mg total) by mouth at bedtime. 30 tablet 3   omeprazole (PRILOSEC) 40 MG capsule Take 1 capsule (40 mg total) by mouth daily. (Patient taking differently: Take 40 mg by mouth as needed.) 30 capsule 3   No facility-administered medications prior to visit.    Review of Systems;  Patient denies headache, fevers, malaise, unintentional weight loss, skin rash, eye pain, sinus congestion and sinus pain, sore throat, dysphagia,  hemoptysis , cough, dyspnea, wheezing, chest pain, palpitations, orthopnea, edema, abdominal pain, nausea, melena, diarrhea, constipation, flank pain, dysuria, hematuria, urinary  Frequency, nocturia, numbness, tingling, seizures,  Focal weakness, Loss of consciousness,  Tremor, insomnia, depression, anxiety, and suicidal ideation.      Objective:  BP 124/86   Pulse 60   Temp 98 F (36.7 C) (Temporal)   Resp 14  Ht 5' (1.524 m)   Wt 88 lb 9.6 oz (40.2 kg)   SpO2 98%   BMI 17.30 kg/m   BP Readings from Last 3 Encounters:  10/30/22 124/86  10/29/22 104/68  07/24/22 104/72    Wt Readings from Last 3 Encounters:  10/30/22 88 lb 9.6 oz (40.2 kg)  10/29/22 89 lb 9.6 oz (40.6 kg)  07/24/22 85 lb 6.4 oz (38.7 kg)    Physical Exam Vitals reviewed.  Constitutional:      General: She is not in acute distress.    Appearance: Normal appearance. She is underweight. She is ill-appearing. She is not toxic-appearing or diaphoretic.  HENT:     Head: Normocephalic.  Eyes:     General: No scleral icterus.        Right eye: No discharge.        Left eye: No discharge.     Conjunctiva/sclera: Conjunctivae normal.  Cardiovascular:     Rate and Rhythm: Normal rate and regular rhythm.     Heart sounds: Normal heart sounds.  Pulmonary:     Effort: Pulmonary effort is normal. No respiratory distress.     Breath sounds: Normal breath sounds.  Musculoskeletal:        General: Normal range of motion.  Skin:    General: Skin is warm and dry.  Neurological:     General: No focal deficit present.     Mental Status: She is alert and oriented to person, place, and time. Mental status is at baseline.  Psychiatric:        Mood and Affect: Mood normal.        Behavior: Behavior normal.        Thought Content: Thought content normal.        Judgment: Judgment normal.     Lab Results  Component Value Date   HGBA1C 5.9 07/24/2022   HGBA1C 5.4 07/18/2021   HGBA1C 5.8 11/28/2020    Lab Results  Component Value Date   CREATININE 0.95 09/01/2022   CREATININE 0.92 06/25/2022   CREATININE 0.98 06/11/2022    Lab Results  Component Value Date   WBC 3.9 (L) 11/28/2021   HGB 11.8 (L) 11/28/2021   HCT 36.3 11/28/2021   PLT 213.0 11/28/2021   GLUCOSE 90 09/01/2022   CHOL 183 09/01/2022   TRIG 64.0 09/01/2022   HDL 88.70 09/01/2022   LDLDIRECT 92.0 11/19/2015   LDLCALC 82 09/01/2022   ALT 17 09/01/2022   AST 26 09/01/2022   NA 141 09/01/2022   K 4.0 09/01/2022   CL 107 09/01/2022   CREATININE 0.95 09/01/2022   BUN 15 09/01/2022   CO2 28 09/01/2022   TSH 1.01 07/24/2022   HGBA1C 5.9 07/24/2022   MICROALBUR 1.8 12/23/2017    US Abdomen Limited RUQ (LIVER/GB)  Result Date: 06/16/2022 CLINICAL DATA:  Elevated liver enzymes EXAM: ULTRASOUND ABDOMEN LIMITED RIGHT UPPER QUADRANT COMPARISON:  None Available. FINDINGS: Gallbladder: No gallstones or wall thickening visualized. No sonographic Murphy sign noted by sonographer. Common bile duct: Diameter: 2.1 mm Liver: No focal lesion identified.  Within normal limits in parenchymal echogenicity. Portal vein is patent on color Doppler imaging with normal direction of blood flow towards the liver. Other: None. IMPRESSION: No cause for the patient's elevated liver enzymes identified. The gallbladder, common bile duct, and liver are unremarkable. Electronically Signed   By: Dorise Bullion III M.D.   On: 06/16/2022 18:25    Assessment & Plan:  .Carotid stenosis, asymptomatic, right  Mixed  hyperlipidemia  Elevated liver enzymes  Hypoglycemia associated with diabetes ([place]) -     FreeStyle Libre 3 Sensor; Place 1 sensor on the skin every 14 days. Use to check glucose continuously  Dispense: 2 each; Refill: 0  Fibromuscular dysplasia ([facility]) Assessment & Plan: BP is currently at goal with minimal dose of amlodipine.  She has annual imaging of head neck and abdomen and /semi annual follow up with [facility] .  the issue of  optimal cholesterol management is ongoing ; her untreated LDL is 82 and her  HDL is 89, but her FMD specialist initiated atorvastatin  bc of the presence of carotid bulb placque on CTA .  she developed documented statin toxiciety (liver enzyme elevation to 4 x ULN, resolved with suspension,  liver ultrasound normal)  and is considering a repeat trial with rosuvastatin.  Not sure if a cardiac CT will provide  any informationthat will  impact decision.  Will discuss with Dr Caryl Comes.   Lab Results  Component Value Date   CHOL 183 09/01/2022   HDL 88.70 09/01/2022   LDLCALC 82 09/01/2022   LDLDIRECT 92.0 11/19/2015   TRIG 64.0 09/01/2022   CHOLHDL 2 09/01/2022      Prediabetes Assessment & Plan: I am beginnig to doubt the veracity of A1c's given the abundance of elevated values in patients such as this one.  CBG monitor placed for 2 week evaluation       I provided 44 minutes of face-to-face time during this encounter reviewing patient's last visit with me, patient's  most recent visit with cardiology,   vascular surgery endocrinology,   recent surgical and non surgical procedures, previous  labs and imaging studies, counseling on currently addressed issues,  and post visit ordering to diagnostics and therapeutics .   Follow-up: Return in about 15 days (around 11/14/2022) for follow up diabetes.   Crecencio Mc, MD

## 2022-10-30 NOTE — Patient Instructions (Signed)
I'll send Dr Caryl Comes a message about getting a coronary calcium CT scan to determine the burden of atherosclerosis vs FMD   Do not start the Crestor  .   Return after 2 weeks  to review the glycemic data

## 2022-10-31 NOTE — Assessment & Plan Note (Addendum)
BP is currently at goal with minimal dose of amlodipine.  She has annual imaging of head neck and abdomen and /semi annual follow up with [facility] .  the issue of  optimal cholesterol management is ongoing ; her untreated LDL is 82 and her  HDL is 89, but her FMD specialist initiated atorvastatin  bc of the presence of carotid bulb placque on CTA .  she developed documented statin toxiciety (liver enzyme elevation to 4 x ULN, resolved with suspension,  liver ultrasound normal)  and is considering a repeat trial with rosuvastatin.  Not sure if a cardiac CT will provide  any informationthat will  impact decision.  Will discuss with Dr Caryl Comes.   Lab Results  Component Value Date   CHOL 183 09/01/2022   HDL 88.70 09/01/2022   LDLCALC 82 09/01/2022   LDLDIRECT 92.0 11/19/2015   TRIG 64.0 09/01/2022   CHOLHDL 2 09/01/2022

## 2022-10-31 NOTE — Assessment & Plan Note (Signed)
I am beginnig to doubt the veracity of A1c's given the abundance of elevated values in patients such as this one.  CBG monitor placed for 2 week evaluation

## 2022-11-01 ENCOUNTER — Telehealth: Payer: Self-pay | Admitting: Internal Medicine

## 2022-11-01 ENCOUNTER — Other Ambulatory Visit: Payer: Self-pay | Admitting: Internal Medicine

## 2022-11-01 DIAGNOSIS — Z789 Other specified health status: Secondary | ICD-10-CM

## 2022-11-01 DIAGNOSIS — I6521 Occlusion and stenosis of right carotid artery: Secondary | ICD-10-CM

## 2022-11-03 ENCOUNTER — Ambulatory Visit: Payer: Medicare Other | Attending: Internal Medicine

## 2022-11-03 ENCOUNTER — Telehealth: Payer: Self-pay | Admitting: Internal Medicine

## 2022-11-03 DIAGNOSIS — R001 Bradycardia, unspecified: Secondary | ICD-10-CM

## 2022-11-03 NOTE — Telephone Encounter (Signed)
MyChart messages have been received from the patient's daughter, [name], whom Dr. Caryl Comes already sees stating:  Hi Dr Jens Som!   Mom is having episodes of bradycardia (hr in the mid40s) and falling asleep with minimal warning (as in, with food in her mouth today). You're the only person she wants to see (I rave about you), but it's been over three years since her last appointment with you. Would it be at all possible to override that and see her?  I'm honestly worried about her. Her full name is Hailey Hunt and her dob is January 03, 1946   After further discussion with Dr. Caryl Comes, he advised that he would see the patient, but prefer she wear a ZIO AT heart monitor prior to the appointment so more information could be provided.  I spoke with [name] today and advised her of the above regarding the heart monitor and she voices understanding and is agreeable.  She is aware I will order this today and to please let me know if it has been more than 5 business days and she still has not received the monitor.  The patient voices understanding and is agreeable.  She also advised that Dr. Derrel Nip was reaching out to Dr. Caryl Comes to discuss a possible Cardiac CT. I advised I am unsure if he has seen notes from Dr. Derrel Nip regarding the Cardiac CT, but will review with him for recommendations.  I will let her know if this is ok to order prior to her appointment with Dr. Caryl Comes. I have scheduled her to follow up in the office for a new patient appointment on 12/11/22 at 10:00 am.  The patient voices understanding of all of the above and is agreeable.

## 2022-11-06 DIAGNOSIS — R001 Bradycardia, unspecified: Secondary | ICD-10-CM | POA: Diagnosis not present

## 2022-11-07 DIAGNOSIS — R001 Bradycardia, unspecified: Secondary | ICD-10-CM | POA: Diagnosis not present

## 2022-11-10 NOTE — Telephone Encounter (Signed)
So I did some research on this this weekend, and as best as I can find CTA is not particularly useful although this paper was written 7 or 8 years ago.  Catheterization was what was indicated but in the absence of scad, I I would not image at this point.  We should see her as scheduled.

## 2022-11-11 NOTE — Telephone Encounter (Signed)
MD recommendations sent to the patient through a MyChart message.

## 2022-11-14 ENCOUNTER — Encounter: Payer: Self-pay | Admitting: Internal Medicine

## 2022-11-14 ENCOUNTER — Ambulatory Visit (INDEPENDENT_AMBULATORY_CARE_PROVIDER_SITE_OTHER): Payer: Medicare Other | Admitting: Internal Medicine

## 2022-11-14 VITALS — BP 118/70 | HR 77 | Ht 60.0 in | Wt 85.6 lb

## 2022-11-14 DIAGNOSIS — R7303 Prediabetes: Secondary | ICD-10-CM

## 2022-11-14 DIAGNOSIS — E785 Hyperlipidemia, unspecified: Secondary | ICD-10-CM | POA: Diagnosis not present

## 2022-11-14 DIAGNOSIS — R944 Abnormal results of kidney function studies: Secondary | ICD-10-CM

## 2022-11-14 DIAGNOSIS — Z789 Other specified health status: Secondary | ICD-10-CM | POA: Diagnosis not present

## 2022-11-14 DIAGNOSIS — I6521 Occlusion and stenosis of right carotid artery: Secondary | ICD-10-CM | POA: Diagnosis not present

## 2022-11-14 LAB — MICROALBUMIN / CREATININE URINE RATIO
Creatinine,U: 58.6 mg/dL
Microalb Creat Ratio: 5.1 mg/g (ref 0.0–30.0)
Microalb, Ur: 3 mg/dL — ABNORMAL HIGH (ref 0.0–1.9)

## 2022-11-14 MED ORDER — SYNTHROID 50 MCG PO TABS: ORAL_TABLET | ORAL | 1 refills | Status: DC

## 2022-11-14 MED ORDER — MOMETASONE FUROATE 0.1 % EX CREA: TOPICAL_CREAM | CUTANEOUS | 1 refills | Status: DC

## 2022-11-14 MED ORDER — AMLODIPINE BESYLATE 2.5 MG PO TABS: 2.5000 mg | ORAL_TABLET | Freq: Every day | ORAL | 1 refills | Status: DC

## 2022-11-14 NOTE — Progress Notes (Unsigned)
Subjective:  Patient ID: Hailey Hunt, female    DOB: 1946-07-11  Age: 77 y.o. MRN: [id number]  CC: There were no encounter diagnoses.   HPI Hailey Hunt presents for follow up on use of CBG monitor for assessment of glycemic control Chief Complaint  Patient presents with   Medical Management of Chronic Issues    Follow up on blood sugar readings   Hailey Hunt is a 77 yr old fenale with a history of fibromuscular dysplasia,  hypertension and prediabetes  with unintention  weight loss due to restriction of diet, was given a CBG monitor to wear for 2 weeks to assess glycemic control  Right ear symptoms ,  itching, "zinging pain .  Has a FH of cholesteatoma. Daughter took a picture of eardrum on or around February 5  which  suggested a cholesteatoma.  She Has an a nppt with [facility] at end of February   Which    10.1   Outpatient Medications Prior to Visit  Medication Sig Dispense Refill   amLODipine (NORVASC) 2.5 MG tablet Take 1 tablet (2.5 mg total) by mouth daily. (Patient taking differently: Take 2.5 mg by mouth daily. Taking 1/2 tablet daily) 90 tablet 3   ASPIRIN 81 PO      cholecalciferol (VITAMIN D) 1000 UNITS tablet Take 1,000 Units by mouth daily.     Continuous Blood Gluc Sensor (FREESTYLE LIBRE 3 SENSOR) MISC Place 1 sensor on the skin every 14 days. Use to check glucose continuously 2 each 0   denosumab (PROLIA) 60 MG/ML SOSY injection      Multiple Vitamins-Minerals (CENTRUM SILVER 50+WOMEN PO) Take by mouth.     SYNTHROID 50 MCG tablet TAKE ONE TABLET BY MOUTH DAILY ON AN EMPTY STOMACH WITH A FULL GLASS OF WATER AT LEAST 30-60 MINUTES BEFORE BREAKFAST 90 tablet 1   traMADol (ULTRAM) 50 MG tablet Take 1 tablet (50 mg total) by mouth every 8 (eight) hours as needed. 30 tablet 3   tretinoin (RETIN-A) 0.025 % cream APPLY TOPICALLY EVERY NIGHT AT BEDTIME 45 g 1   No facility-administered medications prior to visit.    Review of Systems;  Patient denies headache,  fevers, malaise, unintentional weight loss, skin rash, eye pain, sinus congestion and sinus pain, sore throat, dysphagia,  hemoptysis , cough, dyspnea, wheezing, chest pain, palpitations, orthopnea, edema, abdominal pain, nausea, melena, diarrhea, constipation, flank pain, dysuria, hematuria, urinary  Frequency, nocturia, numbness, tingling, seizures,  Focal weakness, Loss of consciousness,  Tremor, insomnia, depression, anxiety, and suicidal ideation.      Objective:  BP 118/70   Pulse 77   Ht 5' (1.524 m)   Wt 85 lb 9.6 oz (38.8 kg)   SpO2 98%   BMI 16.72 kg/m   BP Readings from Last 3 Encounters:  11/14/22 118/70  10/30/22 124/86  10/29/22 104/68    Wt Readings from Last 3 Encounters:  11/14/22 85 lb 9.6 oz (38.8 kg)  10/30/22 88 lb 9.6 oz (40.2 kg)  10/29/22 89 lb 9.6 oz (40.6 kg)    Physical Exam  Lab Results  Component Value Date   HGBA1C 5.9 07/24/2022   HGBA1C 5.4 07/18/2021   HGBA1C 5.8 11/28/2020    Lab Results  Component Value Date   CREATININE 0.95 09/01/2022   CREATININE 0.92 06/25/2022   CREATININE 0.98 06/11/2022    Lab Results  Component Value Date   WBC 3.9 (L) 11/28/2021   HGB 11.8 (L) 11/28/2021   HCT 36.3 11/28/2021  PLT 213.0 11/28/2021   GLUCOSE 90 09/01/2022   CHOL 183 09/01/2022   TRIG 64.0 09/01/2022   HDL 88.70 09/01/2022   LDLDIRECT 92.0 11/19/2015   LDLCALC 82 09/01/2022   ALT 17 09/01/2022   AST 26 09/01/2022   NA 141 09/01/2022   K 4.0 09/01/2022   CL 107 09/01/2022   CREATININE 0.95 09/01/2022   BUN 15 09/01/2022   CO2 28 09/01/2022   TSH 1.01 07/24/2022   HGBA1C 5.9 07/24/2022   MICROALBUR 1.8 12/23/2017    US Abdomen Limited RUQ (LIVER/GB)  Result Date: 06/16/2022 CLINICAL DATA:  Elevated liver enzymes EXAM: ULTRASOUND ABDOMEN LIMITED RIGHT UPPER QUADRANT COMPARISON:  None Available. FINDINGS: Gallbladder: No gallstones or wall thickening visualized. No sonographic Murphy sign noted by sonographer. Common bile duct:  Diameter: 2.1 mm Liver: No focal lesion identified. Within normal limits in parenchymal echogenicity. Portal vein is patent on color Doppler imaging with normal direction of blood flow towards the liver. Other: None. IMPRESSION: No cause for the patient's elevated liver enzymes identified. The gallbladder, common bile duct, and liver are unremarkable. Electronically Signed   By: Dorise Bullion III M.D.   On: 06/16/2022 18:25    Assessment & Plan:  .There are no diagnoses linked to this encounter.   I provided 30 minutes of face-to-face time during this encounter reviewing patient's last visit with me, patient's  most recent visit with cardiology,  nephrology,  and neurology,  recent surgical and non surgical procedures, previous  labs and imaging studies, counseling on currently addressed issues,  and post visit ordering to diagnostics and therapeutics .   Follow-up: No follow-ups on file.   Crecencio Mc, MD

## 2022-11-14 NOTE — Patient Instructions (Signed)
  To make a low carb chip :  Take the Joseph's Lavash or Pita bread,  Or the [place] Low carb whole wheat tortilla   Place on metal cookie sheet  Brush with olive oil  Sprinkle garlic powder (NOT garlic salt), grated parmesan cheese, mediterranean seasoning , or all of them?  Bake at 275 for 30 minutes   We have substitutions for your potatoes!!  Try the mashed cauliflower and riced cauliflower dishes instead of rice and mashed potatoes  Mashed turnips are also very low carb!   For desserts :  Try the Dannon Lt n Fit greek yogurt dessert flavors and top with reddi Whip .  8 carbs,  80 calories  Try Oikos Triple Zero [place] Yogurt in the salted caramel, and the coffee flavors  With Whipped Cream for dessert  breyer's low carb ice cream, available in bars (on a stick, better ) or scoopable ice cream  HERE ARE THE LOW CARB  BREAD CHOICES

## 2022-11-16 NOTE — Assessment & Plan Note (Addendum)
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  I have reviewed the journal review article from [employer] that she requested I read which suggests that there is a potential LPA lowrerng effect from niacin, [employer]  inhibitors, estrogen ,and low carb/high fat diets, but not of statins.  She is statin intolerant   Referral to lipid specialist is under way

## 2022-11-16 NOTE — Assessment & Plan Note (Signed)
I have downloaded and reviewed the data from patient's recewnt use of a  continuous blood glucose monitor. Patient's  sugars have been  IN RANGE  99 % OF THE TIME,   BELOW RANGE   0 % of the time.  And ABOVE RANGE  1 % OF THE TIME .  The information has been helpful in identifying dietary  changes (eg, discontinuation of oatmeal and increased use of low carb protein shakes )  were made based on this review

## 2022-11-17 DIAGNOSIS — J392 Other diseases of pharynx: Secondary | ICD-10-CM | POA: Diagnosis not present

## 2022-11-17 DIAGNOSIS — R509 Fever, unspecified: Secondary | ICD-10-CM | POA: Diagnosis not present

## 2022-11-18 ENCOUNTER — Telehealth: Payer: Self-pay | Admitting: Internal Medicine

## 2022-11-18 ENCOUNTER — Other Ambulatory Visit: Payer: Self-pay

## 2022-11-18 MED ORDER — BENZONATATE 200 MG PO CAPS: 200.0000 mg | ORAL_CAPSULE | Freq: Three times a day (TID) | ORAL | 1 refills | Status: DC | PRN

## 2022-11-18 MED ORDER — PROMETHAZINE-DM 6.25-15 MG/5ML PO SYRP: 5.0000 mL | ORAL_SOLUTION | Freq: Four times a day (QID) | ORAL | 1 refills | Status: AC | PRN

## 2022-11-18 NOTE — Telephone Encounter (Signed)
Pt is aware medication has been sent to [facility].

## 2022-11-18 NOTE — Telephone Encounter (Signed)
Patient tested positive for COVID and was seen at Urgent care in [place]. She is requesting a cough medication. She was given antiviral medication and starting to feel better, just need a cough medication.

## 2022-11-18 NOTE — Telephone Encounter (Signed)
Thank you.! . I have signed it and sent.

## 2022-11-18 NOTE — Telephone Encounter (Signed)
She has a codeine allergy.  Benzonotate capsules sent to [facility].

## 2022-11-18 NOTE — Addendum Note (Signed)
Addended by: Crecencio Mc on: 11/18/2022 05:21 PM   Modules accepted: Orders

## 2022-11-18 NOTE — Addendum Note (Signed)
Addended by: Crecencio Mc on: 11/18/2022 02:41 PM   Modules accepted: Orders

## 2022-11-18 NOTE — Telephone Encounter (Signed)
The medication I have pended is the cough syrup that she is talking about. It was prescribed back in 2019.

## 2022-11-18 NOTE — Telephone Encounter (Signed)
Pt stated that benzonatate does not work for her. Pt stated that before you have prescribed her a cough syrup that did not have codeine in it that worked for her.

## 2022-11-18 NOTE — Addendum Note (Signed)
Addended by: Crecencio Mc on: 11/18/2022 04:18 PM   Modules accepted: Orders

## 2022-11-18 NOTE — Telephone Encounter (Signed)
I cannot find any previous cough medication in her chart.  I f she can take hydrocodone ,  I wil call in tussionex .  Please check with patient

## 2022-11-18 NOTE — Addendum Note (Signed)
Addended by: Adair Laundry on: 11/18/2022 04:56 PM   Modules accepted: Orders

## 2022-11-24 NOTE — Telephone Encounter (Signed)
Last Prolia inj 10/29/22 Next Prolia inj due 04/30/23

## 2022-11-26 ENCOUNTER — Other Ambulatory Visit (INDEPENDENT_AMBULATORY_CARE_PROVIDER_SITE_OTHER): Payer: Medicare Other

## 2022-11-26 DIAGNOSIS — E785 Hyperlipidemia, unspecified: Secondary | ICD-10-CM | POA: Diagnosis not present

## 2022-11-28 LAB — LIPOPROTEIN ANALYSIS BY NMR
HDL Particle Number: 38.7 umol/L (ref >=30.5)
LDL Particle Number: 1127 nmol/L — ABNORMAL HIGH (ref <1000)
LDL Size: 21.3 nm (ref >20.5)
LP-IR Score: <25 (ref <=45)
Small LDL Particle Number: 345 nmol/L (ref <=527)

## 2022-12-01 ENCOUNTER — Ambulatory Visit (INDEPENDENT_AMBULATORY_CARE_PROVIDER_SITE_OTHER): Payer: Medicare Other | Admitting: Internal Medicine

## 2022-12-01 ENCOUNTER — Encounter: Payer: Self-pay | Admitting: Internal Medicine

## 2022-12-01 VITALS — BP 112/78 | HR 61 | Temp 98.0°F | Resp 16 | Ht 60.0 in | Wt 86.2 lb

## 2022-12-01 DIAGNOSIS — I6521 Occlusion and stenosis of right carotid artery: Secondary | ICD-10-CM | POA: Diagnosis not present

## 2022-12-01 DIAGNOSIS — Z789 Other specified health status: Secondary | ICD-10-CM

## 2022-12-01 DIAGNOSIS — I6501 Occlusion and stenosis of right vertebral artery: Secondary | ICD-10-CM | POA: Diagnosis not present

## 2022-12-01 DIAGNOSIS — R7303 Prediabetes: Secondary | ICD-10-CM

## 2022-12-01 DIAGNOSIS — E441 Mild protein-calorie malnutrition: Secondary | ICD-10-CM

## 2022-12-01 LAB — HEMOGLOBIN A1C: Hgb A1c MFr Bld: 6.1 % (ref 4.6–6.5)

## 2022-12-01 MED ORDER — EZETIMIBE 10 MG PO TABS: 10.0000 mg | ORAL_TABLET | Freq: Every day | ORAL | 0 refills | Status: DC

## 2022-12-01 NOTE — Assessment & Plan Note (Signed)
Recommend liberalizing her diet as she has been underweight and lost weight tryig to lower her carb intake

## 2022-12-01 NOTE — Assessment & Plan Note (Addendum)
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  Considering endarterectomy recommended by [facility] cardiologist but has deferred referral to [facility].  she is Statin intolerant .  Trial of Zetia. recommended

## 2022-12-01 NOTE — Patient Instructions (Signed)
If you are willing to try Zetia, you may tolerate it better than the statins that you did not tolerate previously.  It works by inhibiting the absorption of cholesterol by the small intestine, so it lowers LDL .  If you are willing to try it I will send it to your pharmacy for a 3 month trial.

## 2022-12-01 NOTE — Progress Notes (Signed)
Subjective:  Patient ID: Hailey Hunt, female    DOB: [birth year]  Age: 77 y.o. MRN: [id number]  CC: The primary encounter diagnosis was Prediabetes. Diagnoses of Statin intolerance, Vertebral artery stenosis, asymptomatic, right, and Mild protein-calorie malnutrition ([place]) were also pertinent to this visit.   HPI Hailey Hunt presents for follow up on multiple ieeuse Chief Complaint  Patient presents with   Medical Management of Chronic Issues   1) recent COVID infection Feb 16 presented with rigors and fever to 103 for 48 hours. Pharyngitis too severe too eat or drink.  Used lidocaine mouthwash to numb throat  resolve dafter one dose of lidocaine . Went to [facility] for testing ,  COVID positive .  Fever broke after 2 days .  No  other family members were affected  2) Hyperlipidemia: requested a lipoprotein panel ,  reviewed today low risk excpept  for LDL particle size particle number ; however she has a unilateral vertebral artery with severe stenosis and os statin inolerant   3) insulin resistance screen was low.    Has liberalized her diet.     4)  right vertebral artery stenosis (severe by CTA at [facility] August ) reviewed today, her cardiologist recommended Endarterectomy at [facility].   5)   underweight:  Drinking  ensure and premier protein     Outpatient Medications Prior to Visit  Medication Sig Dispense Refill   amLODipine (NORVASC) 2.5 MG tablet Take 1 tablet (2.5 mg total) by mouth daily. Taking 1/2 tablet daily 45 tablet 1   ASPIRIN 81 PO      cholecalciferol (VITAMIN D) 1000 UNITS tablet Take 1,000 Units by mouth daily.     Continuous Blood Gluc Sensor (FREESTYLE LIBRE 3 SENSOR) MISC Place 1 sensor on the skin every 14 days. Use to check glucose continuously 2 each 0   denosumab (PROLIA) 60 MG/ML SOSY injection      mometasone (ELOCON) 0.1 % cream Apply 2 times daily to both ears 15 g 1   Multiple Vitamins-Minerals (CENTRUM SILVER 50+WOMEN PO) Take by mouth.      promethazine-dextromethorphan (PROMETHAZINE-DM) 6.25-15 MG/5ML syrup Take 5 mLs by mouth 4 (four) times daily as needed for cough. 180 mL 1   SYNTHROID 50 MCG tablet TAKE ONE TABLET BY MOUTH DAILY ON AN EMPTY STOMACH WITH A FULL GLASS OF WATER AT LEAST 30-60 MINUTES BEFORE BREAKFAST 90 tablet 1   traMADol (ULTRAM) 50 MG tablet Take 1 tablet (50 mg total) by mouth every 8 (eight) hours as needed. 30 tablet 3   tretinoin (RETIN-A) 0.025 % cream APPLY TOPICALLY EVERY NIGHT AT BEDTIME 45 g 1   No facility-administered medications prior to visit.    Review of Systems;  Patient denies headache, fevers, malaise, unintentional weight loss, skin rash, eye pain, sinus congestion and sinus pain, sore throat, dysphagia,  hemoptysis , cough, dyspnea, wheezing, chest pain, palpitations, orthopnea, edema, abdominal pain, nausea, melena, diarrhea, constipation, flank pain, dysuria, hematuria, urinary  Frequency, nocturia, numbness, tingling, seizures,  Focal weakness, Loss of consciousness,  Tremor, insomnia, depression, anxiety, and suicidal ideation.      Objective:  BP 112/78   Pulse 61   Temp 98 F (36.7 C) (Temporal)   Resp 16   Ht 5' (1.524 m)   Wt 86 lb 3.2 oz (39.1 kg)   SpO2 98%   BMI 16.83 kg/m   BP Readings from Last 3 Encounters:  12/01/22 112/78  11/14/22 118/70  10/30/22 124/86  Wt Readings from Last 3 Encounters:  12/01/22 86 lb 3.2 oz (39.1 kg)  11/14/22 85 lb 9.6 oz (38.8 kg)  10/30/22 88 lb 9.6 oz (40.2 kg)    Physical Exam Vitals reviewed.  Constitutional:      General: She is not in acute distress.    Appearance: She is underweight. She is not ill-appearing, toxic-appearing or diaphoretic.  HENT:     Head: Normocephalic.  Eyes:     General: No scleral icterus.       Right eye: No discharge.        Left eye: No discharge.     Conjunctiva/sclera: Conjunctivae normal.  Cardiovascular:     Rate and Rhythm: Normal rate and regular rhythm.     Heart sounds:  Normal heart sounds.  Pulmonary:     Effort: Pulmonary effort is normal. No respiratory distress.     Breath sounds: Normal breath sounds.  Musculoskeletal:        General: Normal range of motion.  Skin:    General: Skin is warm and dry.  Neurological:     General: No focal deficit present.     Mental Status: She is alert and oriented to person, place, and time. Mental status is at baseline.  Psychiatric:        Mood and Affect: Mood normal.        Behavior: Behavior normal.        Thought Content: Thought content normal.        Judgment: Judgment normal.    Lab Results  Component Value Date   HGBA1C 6.1 12/01/2022   HGBA1C 5.9 07/24/2022   HGBA1C 5.4 07/18/2021    Lab Results  Component Value Date   CREATININE 0.95 09/01/2022   CREATININE 0.92 06/25/2022   CREATININE 0.98 06/11/2022    Lab Results  Component Value Date   WBC 3.9 (L) 11/28/2021   HGB 11.8 (L) 11/28/2021   HCT 36.3 11/28/2021   PLT 213.0 11/28/2021   GLUCOSE 90 09/01/2022   CHOL 183 09/01/2022   TRIG 64.0 09/01/2022   HDL 88.70 09/01/2022   LDLDIRECT 92.0 11/19/2015   LDLCALC 82 09/01/2022   ALT 17 09/01/2022   AST 26 09/01/2022   NA 141 09/01/2022   K 4.0 09/01/2022   CL 107 09/01/2022   CREATININE 0.95 09/01/2022   BUN 15 09/01/2022   CO2 28 09/01/2022   TSH 1.01 07/24/2022   HGBA1C 6.1 12/01/2022   MICROALBUR 3.0 (H) 11/14/2022    US Abdomen Limited RUQ (LIVER/GB)  Result Date: 06/16/2022 CLINICAL DATA:  Elevated liver enzymes EXAM: ULTRASOUND ABDOMEN LIMITED RIGHT UPPER QUADRANT COMPARISON:  None Available. FINDINGS: Gallbladder: No gallstones or wall thickening visualized. No sonographic Murphy sign noted by sonographer. Common bile duct: Diameter: 2.1 mm Liver: No focal lesion identified. Within normal limits in parenchymal echogenicity. Portal vein is patent on color Doppler imaging with normal direction of blood flow towards the liver. Other: None. IMPRESSION: No cause for the  patient's elevated liver enzymes identified. The gallbladder, common bile duct, and liver are unremarkable. Electronically Signed   By: Dorise Bullion III M.D.   On: 06/16/2022 18:25    Assessment & Plan:  .Prediabetes Assessment & Plan: Recommend liberalizing her diet as she has been underweight and lost weight tryig to lower her carb intake   Orders: -     Hemoglobin A1c  Statin intolerance -     COMPLETE METABOLIC PANEL WITH GFR -     Comprehensive metabolic  panel; Future  Vertebral artery stenosis, asymptomatic, right Assessment & Plan: Severe, noted during CTA head at [facility] August 2023, with hypoplastic left VA.  Considering endarterectomy recommended by [facility] cardiologist but has deferred referral to [facility]  she is Statin intolerant .  Trial of Zetia. recommended    Mild protein-calorie malnutrition ([place]) Assessment & Plan: Secondary to to aggressive lifestyle modification withlow glycemic index diet introduced to address prediabetes.  Weight is stable.    Other orders -     Ezetimibe; Take 1 tablet (10 mg total) by mouth daily.  Dispense: 90 tablet; Refill: 0     I provided 40 minutes of face-to-face time during this encounter reviewing patient's last visit with me, patient's  most recent visit with cardiology,  nephrology, recent surgical and non surgical procedures, previous  labs and imaging studies, counseling on currently addressed issues,  and post visit ordering to diagnostics and therapeutics .   Follow-up: No follow-ups on file.   Crecencio Mc, MD

## 2022-12-01 NOTE — Assessment & Plan Note (Signed)
Secondary to to aggressive lifestyle modification withlow glycemic index diet introduced to address prediabetes.  Weight is stable.

## 2022-12-02 LAB — COMPLETE METABOLIC PANEL WITH GFR
AG Ratio: 2.1 (calc) (ref 1.0–2.5)
ALT: 27 U/L (ref 6–29)
AST: 28 U/L (ref 10–35)
Albumin: 4.1 g/dL (ref 3.6–5.1)
Alkaline phosphatase (APISO): 65 U/L (ref 37–153)
BUN: 21 mg/dL (ref 7–25)
CO2: 23 mmol/L (ref 20–32)
Calcium: 9 mg/dL (ref 8.6–10.4)
Chloride: 105 mmol/L (ref 98–110)
Creat: 0.86 mg/dL (ref 0.60–1.00)
Globulin: 2 g/dL (calc) (ref 1.9–3.7)
Glucose, Bld: 90 mg/dL (ref 65–99)
Potassium: 4.4 mmol/L (ref 3.5–5.3)
Sodium: 142 mmol/L (ref 135–146)
Total Bilirubin: 0.3 mg/dL (ref 0.2–1.2)
Total Protein: 6.1 g/dL (ref 6.1–8.1)
eGFR: 71 mL/min/{1.73_m2} (ref >=60)

## 2022-12-08 DIAGNOSIS — L299 Pruritus, unspecified: Secondary | ICD-10-CM | POA: Diagnosis not present

## 2022-12-08 DIAGNOSIS — L728 Other follicular cysts of the skin and subcutaneous tissue: Secondary | ICD-10-CM | POA: Diagnosis not present

## 2022-12-08 DIAGNOSIS — H903 Sensorineural hearing loss, bilateral: Secondary | ICD-10-CM | POA: Diagnosis not present

## 2022-12-11 ENCOUNTER — Ambulatory Visit: Payer: Medicare Other | Attending: Internal Medicine | Admitting: Internal Medicine

## 2022-12-11 ENCOUNTER — Encounter: Payer: Self-pay | Admitting: Internal Medicine

## 2022-12-11 VITALS — BP 118/67 | HR 63 | Ht 63.0 in | Wt 87.0 lb

## 2022-12-11 DIAGNOSIS — R001 Bradycardia, unspecified: Secondary | ICD-10-CM | POA: Diagnosis not present

## 2022-12-11 NOTE — Progress Notes (Signed)
ELECTROPHYSIOLOGY CONSULT NOTE  Patient ID: Hailey Hunt, MRN: [id number], DOB/AGE: 06-26-1946 77 y.o. Admit date: (Not on file) Date of Consult: 12/13/2022  Primary Physician: Crecencio Mc, MD Primary Cardiologist: new     Hailey Hunt is a 77 y.o. female who is being seen today for the evaluation of FMD at the request of Dr Derrel Nip.    HPI Hailey Hunt is a 77 y.o. female seen at her own request because of history of fibromuscular dysplasia noted in carotid arteries and renal arteries, hypertension.  Recent interval detection at [facility] of ":atherosclerosis" in the carotid FMD for which statin was initiated but intolerant.  She is now< previously not, on ASA per UptoDATE recommendations  The patient denies chest pain, shortness of breath, nocturnal dyspnea, orthopnea or peripheral edema.  There have been no palpitations, lightheadedness or syncope.       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[place])    Dr Mervyn Skeeters   Paget's disease of the bone    Pernicious anemia    Thyroid disease    Urinary incontinence, mixed    resolved with removal of ained sutures in bladder       Surgical History:  Past Surgical History:  Procedure Laterality Date   BILATERAL SALPINGOOPHORECTOMY     CATARACT  EXTRACTION  Sep 10, 2011, Jan 13,2013   COLONOSCOPY     RENAL BIOPSY     TONSILLECTOMY     VAGINAL DELIVERY     x2   VAGINAL HYSTERECTOMY     WISDOM TOOTH EXTRACTION       Home Meds: Current Meds  Medication Sig   amLODipine (NORVASC) 2.5 MG tablet Take 1 tablet (2.5 mg total) by mouth daily. Taking 1/2 tablet daily   ASPIRIN 81 PO    cholecalciferol (VITAMIN D) 1000 UNITS tablet Take 1,000 Units by mouth daily.   Continuous Blood Gluc Sensor (FREESTYLE LIBRE 3 SENSOR) MISC Place 1 sensor on the skin every 14 days. Use to check glucose continuously   denosumab (PROLIA) 60 MG/ML SOSY injection    ezetimibe (ZETIA) 10 MG tablet Take 1 tablet (10 mg total) by mouth daily.   mometasone (ELOCON) 0.1 % cream Apply 2 times daily to both ears   Multiple Vitamins-Minerals (CENTRUM SILVER 50+WOMEN PO) Take by mouth.   promethazine-dextromethorphan (PROMETHAZINE-DM) 6.25-15 MG/5ML syrup Take 5 mLs by mouth 4 (four) times daily as needed for cough.   SYNTHROID 50 MCG tablet TAKE ONE TABLET BY MOUTH DAILY ON AN EMPTY STOMACH WITH A FULL GLASS  OF WATER AT LEAST 30-60 MINUTES BEFORE BREAKFAST   traMADol (ULTRAM) 50 MG tablet Take 1 tablet (50 mg total) by mouth every 8 (eight) hours as needed.   tretinoin (RETIN-A) 0.025 % cream APPLY TOPICALLY EVERY NIGHT AT BEDTIME    Allergies:  Allergies  Allergen Reactions   Voltaren [Diclofenac] Hypertension   Adhesive [Tape]    Atorvastatin Other (See Comments)   Demerol     HYPOTENSION   Macrodantin    Nsaids Hypertension   Voltaren [Diclofenac Sodium] Other (See Comments)    FMD, increased bp   Codeine Nausea And Vomiting and Rash   Nickel Rash   Penicillins Rash   Sulfa Antibiotics Rash    Fever    Social History   Socioeconomic History   Marital status: Married    Spouse name: Not on file   Number of children: 2   Years of education: Not on file   Highest education level: Not on file  Occupational History   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  Tobacco Use   Smoking status: Never   Smokeless tobacco: Never  Vaping Use   Vaping Use: Never used  Substance and Sexual Activity   Alcohol use: Yes    Comment: once a year   Drug use: No   Sexual activity: Not Currently  Other Topics Concern   Not on file  Social History Narrative   Not on file   Social Determinants of Health   Financial Resource Strain: Low Risk  (05/20/2022)   Overall Financial Resource Strain (CARDIA)    Difficulty of Paying Living Expenses: Not hard at all  Food Insecurity: No Food Insecurity (05/20/2022)   Hunger Vital Sign    Worried About Running Out of Food in the Last Year: Never true    Ran Out of Food in the Last Year: Never true  Transportation Needs: No Transportation Needs (05/20/2022)   PRAPARE - Hydrologist (Medical): No    Lack of Transportation (Non-Medical): No  Physical Activity: Sufficiently Active (05/20/2022)   Exercise Vital Sign    Days of Exercise per Week: 7 days    Minutes of Exercise per Session: 60 min  Stress: No Stress Concern Present (05/20/2022)   [place]    Feeling of Stress : Not at all  Social Connections: Unknown (05/20/2022)   Social Connection and Isolation Panel [NHANES]    Frequency of Communication with Friends and Family: Not on file    Frequency of Social Gatherings with Friends and Family: Not on file    Attends Religious Services: Not on file    Active Member of Clubs or Organizations: Not on file    Attends Archivist Meetings: Not on file    Marital Status: Married  Intimate Partner Violence: Not At Risk (05/20/2022)   Humiliation, Afraid, Rape, and Kick questionnaire    Fear of Current or Ex-Partner: No    Emotionally Abused: No    Physically Abused: No    Sexually Abused: No     Family History  Problem Relation Age of Onset   Anuerysm Mother    COPD Mother    Hypertension Mother     Heart disease Mother    Heart failure Mother    Cancer Father        lung CA   Heart disease Father    Heart attack Father    Heart disease Brother  viral cardiomyopathy, cocaine use   Drug abuse Brother    Cancer Maternal Aunt        esophageal and live rCA,  hisotory of  ETOH and tobacco   Anuerysm Maternal Grandfather    Cancer Paternal Grandmother        brain tumor   Diabetes Sister    Colon cancer Paternal Uncle      ROS:  Please see the history of present illness.     All other systems reviewed and negative.    Physical Exam: Blood pressure 118/67, pulse 63, height 5\' 3"  (1.6 m), weight 87 lb (39.5 kg). General: Well developed, asthenic  female in no acute distress. Head: Normocephalic, atraumatic, sclera non-icteric, no xanthomas, nares are without discharge. EENT: normal  Lymph Nodes:  none Neck: Negative for carotid bruits. JVD not elevated. Back:without scoliosis kyphosis Lungs: Clear bilaterally to auscultation without wheezes, rales, or rhonchi. Breathing is unlabored. Heart: RRR with S1 S2. No  murmur . No rubs, or gallops appreciated. Abdomen: Soft, non-tender, non-distended with normoactive bowel sounds. No hepatomegaly. No rebound/guarding. No obvious abdominal masses. Msk:  Strength and tone appear normal for age. Extremities: No clubbing or cyanosis. No edema.  Distal pedal pulses are 2+ and equal bilaterally. Skin: Warm and Dry Neuro: Alert and oriented X 3. CN III-XII intact Grossly normal sensory and motor function . Psych:  Responds to questions appropriately with a normal affect.        EKG:     Assessment and Plan:   Fibromuscular dysplasia, carotids and renal  From a cardiovascular point of view, doing well and nothing to do at this point x would continue ASA      Up-to-date suggest that fibromuscular dysplasia is appropriately treated with aspirin  Virl Axe

## 2022-12-11 NOTE — Patient Instructions (Signed)
Medication Instructions:  Your physician recommends that you continue on your current medications as directed. Please refer to the Current Medication list given to you today.  *If you need a refill on your cardiac medications before your next appointment, please call your pharmacy*   Lab Work: None ordered.  If you have labs (blood work) drawn today and your tests are completely normal, you will receive your results only by: MyChart Message (if you have MyChart) OR A paper copy in the mail If you have any lab test that is abnormal or we need to change your treatment, we will call you to review the results.   Testing/Procedures: None ordered.    Follow-Up: At [facility], you and your health needs are our priority.  As part of our continuing mission to provide you with exceptional heart care, we have created designated Provider Care Teams.  These Care Teams include your primary Cardiologist (physician) and Advanced Practice Providers (APPs -  Physician Assistants and Nurse Practitioners) who all work together to provide you with the care you need, when you need it.  We recommend signing up for the patient portal called "MyChart".  Sign up information is provided on this After Visit Summary.  MyChart is used to connect with patients for Virtual Visits (Telemedicine).  Patients are able to view lab/test results, encounter notes, upcoming appointments, etc.  Non-urgent messages can be sent to your provider as well.   To learn more about what you can do with MyChart, go to [email or web address].    Your next appointment:   Follow up with Dr Klein as needed 

## 2022-12-17 ENCOUNTER — Encounter: Payer: Self-pay | Admitting: Internal Medicine

## 2022-12-18 ENCOUNTER — Other Ambulatory Visit: Payer: Self-pay | Admitting: Internal Medicine

## 2022-12-18 DIAGNOSIS — M810 Age-related osteoporosis without current pathological fracture: Secondary | ICD-10-CM

## 2022-12-18 DIAGNOSIS — M8889 Osteitis deformans of multiple sites: Secondary | ICD-10-CM

## 2022-12-23 ENCOUNTER — Other Ambulatory Visit (INDEPENDENT_AMBULATORY_CARE_PROVIDER_SITE_OTHER): Payer: Medicare Other

## 2022-12-23 DIAGNOSIS — R944 Abnormal results of kidney function studies: Secondary | ICD-10-CM

## 2022-12-23 DIAGNOSIS — Z789 Other specified health status: Secondary | ICD-10-CM | POA: Diagnosis not present

## 2022-12-23 LAB — COMPREHENSIVE METABOLIC PANEL
ALT: 26 U/L (ref 0–35)
AST: 24 U/L (ref 0–37)
Albumin: 4.4 g/dL (ref 3.5–5.2)
Alkaline Phosphatase: 72 U/L (ref 39–117)
BUN: 17 mg/dL (ref 6–23)
CO2: 31 mEq/L (ref 19–32)
Calcium: 9.8 mg/dL (ref 8.4–10.5)
Chloride: 102 mEq/L (ref 96–112)
Creatinine, Ser: 1 mg/dL (ref 0.40–1.20)
GFR: 55.55 mL/min — ABNORMAL LOW (ref >60.00)
Glucose, Bld: 95 mg/dL (ref 70–99)
Potassium: 4.1 mEq/L (ref 3.5–5.1)
Sodium: 141 mEq/L (ref 135–145)
Total Bilirubin: 0.7 mg/dL (ref 0.2–1.2)
Total Protein: 6.5 g/dL (ref 6.0–8.3)

## 2022-12-24 NOTE — Addendum Note (Signed)
Addended by: Crecencio Mc on: 12/24/2022 08:55 PM   Modules accepted: Orders

## 2022-12-25 NOTE — Telephone Encounter (Signed)
My Chart message sent

## 2022-12-30 ENCOUNTER — Ambulatory Visit (INDEPENDENT_AMBULATORY_CARE_PROVIDER_SITE_OTHER)
Admission: RE | Admit: 2022-12-30 | Discharge: 2022-12-30 | Disposition: A | Discharge status: Home / Self Care | Payer: Medicare Other | Source: Ambulatory Visit | Attending: Internal Medicine | Admitting: Internal Medicine

## 2022-12-30 DIAGNOSIS — M810 Age-related osteoporosis without current pathological fracture: Secondary | ICD-10-CM

## 2023-01-07 ENCOUNTER — Other Ambulatory Visit (INDEPENDENT_AMBULATORY_CARE_PROVIDER_SITE_OTHER): Payer: Medicare Other

## 2023-01-07 DIAGNOSIS — R944 Abnormal results of kidney function studies: Secondary | ICD-10-CM

## 2023-01-07 LAB — BASIC METABOLIC PANEL
BUN: 16 mg/dL (ref 6–23)
CO2: 30 mEq/L (ref 19–32)
Calcium: 9.1 mg/dL (ref 8.4–10.5)
Chloride: 102 mEq/L (ref 96–112)
Creatinine, Ser: 0.83 mg/dL (ref 0.40–1.20)
GFR: 69.45 mL/min (ref >60.00)
Glucose, Bld: 89 mg/dL (ref 70–99)
Potassium: 3.9 mEq/L (ref 3.5–5.1)
Sodium: 140 mEq/L (ref 135–145)

## 2023-02-03 DIAGNOSIS — L728 Other follicular cysts of the skin and subcutaneous tissue: Secondary | ICD-10-CM | POA: Diagnosis not present

## 2023-02-18 ENCOUNTER — Encounter: Payer: Self-pay | Admitting: Internal Medicine

## 2023-02-18 DIAGNOSIS — E782 Mixed hyperlipidemia: Secondary | ICD-10-CM

## 2023-02-18 DIAGNOSIS — I773 Arterial fibromuscular dysplasia: Secondary | ICD-10-CM

## 2023-02-25 ENCOUNTER — Other Ambulatory Visit (INDEPENDENT_AMBULATORY_CARE_PROVIDER_SITE_OTHER): Payer: Medicare Other

## 2023-02-25 DIAGNOSIS — E782 Mixed hyperlipidemia: Secondary | ICD-10-CM | POA: Diagnosis not present

## 2023-02-25 DIAGNOSIS — I773 Arterial fibromuscular dysplasia: Secondary | ICD-10-CM

## 2023-02-26 LAB — LIPID PANEL
Cholesterol: 197 mg/dL (ref 0–200)
HDL: 94.6 mg/dL (ref >39.00)
LDL Cholesterol: 91 mg/dL (ref 0–99)
NonHDL: 102.88
Total CHOL/HDL Ratio: 2
Triglycerides: 59 mg/dL (ref 0.0–149.0)
VLDL: 11.8 mg/dL (ref 0.0–40.0)

## 2023-02-26 LAB — COMPREHENSIVE METABOLIC PANEL
ALT: 19 U/L (ref 0–35)
AST: 25 U/L (ref 0–37)
Albumin: 4.3 g/dL (ref 3.5–5.2)
Alkaline Phosphatase: 65 U/L (ref 39–117)
BUN: 27 mg/dL — ABNORMAL HIGH (ref 6–23)
CO2: 28 mEq/L (ref 19–32)
Calcium: 9.5 mg/dL (ref 8.4–10.5)
Chloride: 103 mEq/L (ref 96–112)
Creatinine, Ser: 1.02 mg/dL (ref 0.40–1.20)
GFR: 54.18 mL/min — ABNORMAL LOW (ref >60.00)
Glucose, Bld: 87 mg/dL (ref 70–99)
Potassium: 4.1 mEq/L (ref 3.5–5.1)
Sodium: 142 mEq/L (ref 135–145)
Total Bilirubin: 0.6 mg/dL (ref 0.2–1.2)
Total Protein: 6.5 g/dL (ref 6.0–8.3)

## 2023-03-03 ENCOUNTER — Ambulatory Visit (INDEPENDENT_AMBULATORY_CARE_PROVIDER_SITE_OTHER): Payer: Medicare Other | Admitting: Internal Medicine

## 2023-03-03 ENCOUNTER — Encounter: Payer: Self-pay | Admitting: Internal Medicine

## 2023-03-03 VITALS — BP 124/64 | HR 80 | Temp 97.9°F | Ht 63.0 in | Wt 91.8 lb

## 2023-03-03 DIAGNOSIS — I701 Atherosclerosis of renal artery: Secondary | ICD-10-CM | POA: Diagnosis not present

## 2023-03-03 DIAGNOSIS — E782 Mixed hyperlipidemia: Secondary | ICD-10-CM | POA: Diagnosis not present

## 2023-03-03 DIAGNOSIS — I6521 Occlusion and stenosis of right carotid artery: Secondary | ICD-10-CM

## 2023-03-03 DIAGNOSIS — Z974 Presence of external hearing-aid: Secondary | ICD-10-CM

## 2023-03-03 DIAGNOSIS — R7303 Prediabetes: Secondary | ICD-10-CM | POA: Diagnosis not present

## 2023-03-03 DIAGNOSIS — I150 Renovascular hypertension: Secondary | ICD-10-CM

## 2023-03-03 MED ORDER — SYNTHROID 50 MCG PO TABS: ORAL_TABLET | ORAL | 1 refills | Status: DC

## 2023-03-03 MED ORDER — EZETIMIBE 10 MG PO TABS: 10.0000 mg | ORAL_TABLET | Freq: Every day | ORAL | 1 refills | Status: DC

## 2023-03-03 MED ORDER — AMLODIPINE BESYLATE 2.5 MG PO TABS: 2.5000 mg | ORAL_TABLET | Freq: Every day | ORAL | 1 refills | Status: DC

## 2023-03-03 NOTE — Assessment & Plan Note (Signed)
Taking Zetia

## 2023-03-03 NOTE — Patient Instructions (Signed)
HOORAY!!!!!!!!  SEE YOU IN 3 MONTHS AND 6 LBS

## 2023-03-03 NOTE — Progress Notes (Signed)
Subjective:  Patient ID: Hailey Hunt, female    DOB: 1946-01-02  Age: 77 y.o. MRN: [id number]  CC: There were no encounter diagnoses.   HPI [name] presents for  Chief Complaint  Patient presents with   Medical Management of Chronic Issues    3 month follow up   1) Prediabetes : she has liberalized her diet due to being painfully underweight.  Has increased protein to 25 g at breakfast. Feeling better , happier .  Weight gain of 5 lbs since last visit. (Repeat A1c is LOWER at 5.5 (previously 6.2) . Bowels moving more often,  formed.   2)  CKD :  stable   3) bilateral hearing aids since last visit . Blue  tooth capable,  happy.    4) ear lesion:  returned to [facility] and had an incision and drainage of a  Sebaceous cyst.      5) chronic insomnia:  declines therapy due to daughter Whitney's  seizure disorder.   Outpatient Medications Prior to Visit  Medication Sig Dispense Refill   amLODipine (NORVASC) 2.5 MG tablet Take 1 tablet (2.5 mg total) by mouth daily. Taking 1/2 tablet daily 45 tablet 1   ASPIRIN 81 PO      cholecalciferol (VITAMIN D) 1000 UNITS tablet Take 1,000 Units by mouth daily.     Continuous Blood Gluc Sensor (FREESTYLE LIBRE 3 SENSOR) MISC Place 1 sensor on the skin every 14 days. Use to check glucose continuously 2 each 0   denosumab (PROLIA) 60 MG/ML SOSY injection      ezetimibe (ZETIA) 10 MG tablet Take 1 tablet (10 mg total) by mouth daily. 90 tablet 0   mometasone (ELOCON) 0.1 % cream Apply 2 times daily to both ears 15 g 1   SYNTHROID 50 MCG tablet TAKE ONE TABLET BY MOUTH DAILY ON AN EMPTY STOMACH WITH A FULL GLASS OF WATER AT LEAST 30-60 MINUTES BEFORE BREAKFAST 90 tablet 1   traMADol (ULTRAM) 50 MG tablet Take 1 tablet (50 mg total) by mouth every 8 (eight) hours as needed. 30 tablet 3   tretinoin (RETIN-A) 0.025 % cream APPLY TOPICALLY EVERY NIGHT AT BEDTIME 45 g 1   Multiple Vitamins-Minerals (CENTRUM SILVER 50+WOMEN PO) Take by mouth.  (Patient not taking: Reported on 03/03/2023)     promethazine-dextromethorphan (PROMETHAZINE-DM) 6.25-15 MG/5ML syrup Take 5 mLs by mouth 4 (four) times daily as needed for cough. (Patient not taking: Reported on 03/03/2023) 180 mL 1   No facility-administered medications prior to visit.    Review of Systems;  Patient denies headache, fevers, malaise, unintentional weight loss, skin rash, eye pain, sinus congestion and sinus pain, sore throat, dysphagia,  hemoptysis , cough, dyspnea, wheezing, chest pain, palpitations, orthopnea, edema, abdominal pain, nausea, melena, diarrhea, constipation, flank pain, dysuria, hematuria, urinary  Frequency, nocturia, numbness, tingling, seizures,  Focal weakness, Loss of consciousness,  Tremor, insomnia, depression, anxiety, and suicidal ideation.      Objective:  BP 124/64   Pulse 80   Temp 97.9 F (36.6 C) (Oral)   Ht 5\' 3"  (1.6 m)   Wt 91 lb 12.8 oz (41.6 kg)   SpO2 97%   BMI 16.26 kg/m   BP Readings from Last 3 Encounters:  03/03/23 124/64  12/11/22 118/67  12/01/22 112/78    Wt Readings from Last 3 Encounters:  03/03/23 91 lb 12.8 oz (41.6 kg)  12/11/22 87 lb (39.5 kg)  12/01/22 86 lb 3.2 oz (39.1 kg)  Physical Exam  Lab Results  Component Value Date   HGBA1C 6.1 12/01/2022   HGBA1C 5.9 07/24/2022   HGBA1C 5.4 07/18/2021    Lab Results  Component Value Date   CREATININE 1.02 02/25/2023   CREATININE 0.83 01/07/2023   CREATININE 1.00 12/23/2022    Lab Results  Component Value Date   WBC 3.9 (L) 11/28/2021   HGB 11.8 (L) 11/28/2021   HCT 36.3 11/28/2021   PLT 213.0 11/28/2021   GLUCOSE 87 02/25/2023   CHOL 197 02/25/2023   TRIG 59.0 02/25/2023   HDL 94.60 02/25/2023   LDLDIRECT 92.0 11/19/2015   LDLCALC 91 02/25/2023   ALT 19 02/25/2023   AST 25 02/25/2023   NA 142 02/25/2023   K 4.1 02/25/2023   CL 103 02/25/2023   CREATININE 1.02 02/25/2023   BUN 27 (H) 02/25/2023   CO2 28 02/25/2023   TSH 1.01 07/24/2022    HGBA1C 6.1 12/01/2022   MICROALBUR 3.0 (H) 11/14/2022    DG Bone Density  Result Date: 01/01/2023 Table formatting from the original result was not included. Date of study: 12/30/2022 Exam: DUAL X-RAY ABSORPTIOMETRY (DXA) FOR BONE MINERAL DENSITY (BMD) Instrument: [employer] Requesting Provider: PCP Indication: follow up for low BMD Comparison: 2022 (please note that it is not possible to compare data from different instruments) Clinical data: Pt is a 77 y.o. female without previous history of fracture. Results:  Lumbar spine L1-L4 Femoral neck (FN) T-score -0.3 RFN: -2.1 LFN: -2.1 Change in BMD from previous DXA test (%) Down 1.5% 0.0 (*) statistically significant Assessment: By the [facility] Criteria for diagnosis based on bone density, this patient has Low Bone Density FRAX 10-year fracture risk calculator: Not calculated, Patient on Denosumab Comments: the technical quality of the study is good. WHO criteria for diagnosis of osteoporosis in postmenopausal women and in men 4 y/o or older: - normal: T-score -1.0 to + 1.0 - osteopenia/low bone density: T-score between -2.5 and -1.0 - osteoporosis: T-score below -2.5 - severe osteoporosis: T-score below -2.5 with history of fragility fracture Note: although not part of the WHO classification, the presence of a fragility fracture, regardless of the T-score, should be considered diagnostic of osteoporosis, provided other causes for the fracture have been excluded. Follow up BMD is recommended: 2 years. Interpreted by : Lyndle Herrlich, MD [facility]    Assessment & Plan:  .There are no diagnoses linked to this encounter.   I provided 30 minutes of face-to-face time during this encounter reviewing patient's last visit with me, patient's  most recent visit with cardiology,  nephrology,  and neurology,  recent surgical and non surgical procedures, previous  labs and imaging studies, counseling on currently addressed issues,  and post  visit ordering to diagnostics and therapeutics .   Follow-up: No follow-ups on file.   Sherlene Shams, MD

## 2023-03-05 DIAGNOSIS — E785 Hyperlipidemia, unspecified: Secondary | ICD-10-CM | POA: Insufficient documentation

## 2023-03-05 DIAGNOSIS — Z974 Presence of external hearing-aid: Secondary | ICD-10-CM | POA: Insufficient documentation

## 2023-03-05 LAB — POCT GLYCOSYLATED HEMOGLOBIN (HGB A1C): Hemoglobin A1C: 5.5 % (ref 4.0–5.6)

## 2023-03-05 NOTE — Assessment & Plan Note (Signed)
Well controlled on current regimen. Of 1.25 mg amlodipine daily  Renal function stable, no changes today.

## 2023-03-05 NOTE — Assessment & Plan Note (Signed)
Tolerating zetia.  LDL is < 100  Lab Results  Component Value Date   CHOL 197 02/25/2023   HDL 94.60 02/25/2023   LDLCALC 91 02/25/2023   LDLDIRECT 92.0 11/19/2015   TRIG 59.0 02/25/2023   CHOLHDL 2 02/25/2023

## 2023-03-05 NOTE — Assessment & Plan Note (Addendum)
Ironically her aqc has improved with liberalization of her diet and she is gaining much needed weight   Lab Results  Component Value Date   HGBA1C 5.5 03/05/2023

## 2023-03-05 NOTE — Assessment & Plan Note (Signed)
Recently acquired during evaluation for ear lesion

## 2023-03-30 DIAGNOSIS — Z1231 Encounter for screening mammogram for malignant neoplasm of breast: Secondary | ICD-10-CM | POA: Diagnosis not present

## 2023-03-30 LAB — HM MAMMOGRAPHY

## 2023-04-08 NOTE — Telephone Encounter (Signed)
Prolia VOB initiated via [email or web address]   Next Prolia inj DUE: 04/30/23

## 2023-04-18 NOTE — Telephone Encounter (Signed)
NO Prior Auth required

## 2023-04-23 NOTE — Telephone Encounter (Addendum)
Pt ready for scheduling on or after 04/30/23  Out-of-pocket cost due at time of visit: $0  Primary: Medicare Prolia co-insurance: 20% (approximately $320) Admin fee co-insurance: 20% (approximately $25)  Deductible: $240 of $240 met  Secondary: BCBS Medicare Supplement Prolia co-insurance: Covers Medicare Part B co-insurance Admin fee co-insurance: Covers Medicare Part B co-insurance  Deductible:  Covered by secondary  Prior Auth: NOT required PA# Valid:   ** This summary of benefits is an estimation of the patient's out-of-pocket cost. Exact cost may vary based on individual plan coverage.

## 2023-05-01 ENCOUNTER — Ambulatory Visit (INDEPENDENT_AMBULATORY_CARE_PROVIDER_SITE_OTHER): Payer: Medicare Other

## 2023-05-01 VITALS — BP 128/72 | HR 72 | Ht 63.0 in | Wt 92.2 lb

## 2023-05-01 DIAGNOSIS — M810 Age-related osteoporosis without current pathological fracture: Secondary | ICD-10-CM | POA: Diagnosis not present

## 2023-05-01 MED ORDER — DENOSUMAB 60 MG/ML ~~LOC~~ SOSY
60.0000 mg | PREFILLED_SYRINGE | Freq: Once | SUBCUTANEOUS | Status: AC
Start: 2023-05-01 — End: 2023-05-01
  Administered 2023-05-01: 60 mg via SUBCUTANEOUS

## 2023-05-01 NOTE — Progress Notes (Signed)
After obtaining consent, and per orders of Dr. Elvera Lennox (Dr. West Carbo on call), injection of Prolia 60 mg given by Pollie Meyer. Patient instructed to remain in clinic for 20 minutes afterwards, and to report any adverse reaction to me immediately.

## 2023-05-05 ENCOUNTER — Encounter: Payer: Self-pay | Admitting: Internal Medicine

## 2023-05-05 ENCOUNTER — Ambulatory Visit: Payer: Medicare Other | Attending: Internal Medicine | Admitting: Internal Medicine

## 2023-05-05 VITALS — BP 123/61 | HR 72 | Ht 63.0 in | Wt 93.2 lb

## 2023-05-05 DIAGNOSIS — T466X5A Adverse effect of antihyperlipidemic and antiarteriosclerotic drugs, initial encounter: Secondary | ICD-10-CM | POA: Insufficient documentation

## 2023-05-05 DIAGNOSIS — K719 Toxic liver disease, unspecified: Secondary | ICD-10-CM | POA: Insufficient documentation

## 2023-05-05 DIAGNOSIS — E785 Hyperlipidemia, unspecified: Secondary | ICD-10-CM | POA: Diagnosis not present

## 2023-05-05 NOTE — Patient Instructions (Signed)
Medication Instructions:  STOP zetia once Nexlizet is approved -- this is a combination of Nexletol + Zetia  *If you need a refill on your cardiac medications before your next appointment, please call your pharmacy*   Lab Work: FASTING NMR lipoprofile in 3-4 months  If you have labs (blood work) drawn today and your tests are completely normal, you will receive your results only by: MyChart Message (if you have MyChart) OR A paper copy in the mail If you have any lab test that is abnormal or we need to change your treatment, we will call you to review the results.    Follow-Up: At [facility], you and your health needs are our priority.  As part of our continuing mission to provide you with exceptional heart care, we have created designated Provider Care Teams.  These Care Teams include your primary Cardiologist (physician) and Advanced Practice Providers (APPs -  Physician Assistants and Nurse Practitioners) who all work together to provide you with the care you need, when you need it.  We recommend signing up for the patient portal called "MyChart".  Sign up information is provided on this After Visit Summary.  MyChart is used to connect with patients for Virtual Visits (Telemedicine).  Patients are able to view lab/test results, encounter notes, upcoming appointments, etc.  Non-urgent messages can be sent to your provider as well.   To learn more about what you can do with MyChart, go to [email or web address].    Your next appointment:    3-4 months with Dr. Rennis Golden

## 2023-05-06 NOTE — Telephone Encounter (Signed)
Last Prolia inj 05/01/23 Next Prolia inj due 11/02/23

## 2023-05-06 NOTE — Progress Notes (Signed)
LIPID CLINIC CONSULT NOTE  Chief Complaint:  Dyslipidemia  Primary Care Physician: Sherlene Shams, MD  Primary Cardiologist:  None  HPI:  Hailey Hunt is a 77 y.o. female who is being seen today for the evaluation of dyslipidemia at the request of Darrick Huntsman, Mar Daring, MD. is a pleasant 77 year old female kindly referred for evaluation and management of dyslipidemia.  She has a history of statin intolerance having been placed on atorvastatin in the past by her FMD specialist and having had significant elevation in her liver enzymes in the upper 100s to 200 range.  Fortunately, based on recent labs this has resolved.  She apparently had seen a hepatologist and was not noted to have any underlying hepatic pathology to explain this although the statins themselves should not cause this without some underlying abnormality such as steatohepatitis.  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  She has been maintained on ezetimibe which she seems to tolerate well.  This is not worsened her liver enzymes.  Her recent lipid profile though showed total cholesterol 191, triglycerides 94, HDL 59 and LDL 91.  Her target LDL is less than 70 as she underwent CT angiography of the head and neck noting that there was severe atherosclerosis of the neck vessels in addition to findings consistent with fibromuscular dysplasia, this study being performed at [facility] where she sees her specialist.  She also tells me she has a history of a clotting disorder and was advised not to be on aspirin although also advised to be on aspirin given the fact that she has severe atherosclerotic vascular disease.  PMHx:  Past Medical History:  Diagnosis Date   Aberrant right subclavian artery    Cataract    surgery   Clotting disorder (HCC)    hx of bleeding after vaginal delivery, dr told her not to have any surgeries that are necessary   Dysplasia, artery,  fibromuscular, renal (HCC)    Fibromuscular dysplasia (HCC)    Fibromuscular dysplasia of renal artery (HCC)    managed by Dr. Melvern Sample [facility]   Graves disease    Heart murmur    Heart murmur    Hypertension    Kidney stone    Migraines    Neuropathy with IgA monoclonal gammopathy (HCC)    Dr Ellis Savage   Paget's disease of the bone    Pernicious anemia    Thyroid disease    Urinary incontinence, mixed    resolved with removal of ained sutures in bladder     Past Surgical History:  Procedure Laterality Date   BILATERAL SALPINGOOPHORECTOMY     CATARACT EXTRACTION  Sep 10, 2011, Jan 13,2013   COLONOSCOPY     RENAL BIOPSY     TONSILLECTOMY     VAGINAL DELIVERY     x2   VAGINAL HYSTERECTOMY     WISDOM TOOTH EXTRACTION      FAMHx:  Family History  Problem Relation Age of Onset   Anuerysm Mother    COPD Mother    Hypertension Mother    Heart disease Mother    Heart failure Mother    Cancer Father        lung CA   Heart disease Father    Heart attack Father    Heart disease Brother        viral cardiomyopathy, cocaine use   Drug abuse Brother    Cancer Maternal Aunt  esophageal and live rCA,  hisotory of  ETOH and tobacco   Anuerysm Maternal Grandfather    Cancer Paternal Grandmother        brain tumor   Diabetes Sister    Colon cancer Paternal Uncle     SOCHx:   reports that she has never smoked. She has never used smokeless tobacco. She reports current alcohol use. She reports that she does not use drugs.  ALLERGIES:  Allergies  Allergen Reactions   Voltaren [Diclofenac] Hypertension   Adhesive [Tape]    Atorvastatin Other (See Comments)   Demerol     HYPOTENSION   Macrodantin    Nsaids Hypertension   Voltaren [Diclofenac Sodium] Other (See Comments)    FMD, increased bp   Codeine Nausea And Vomiting and Rash   Nickel Rash   Penicillins Rash   Sulfa Antibiotics Rash    Fever    ROS: Pertinent items noted in HPI and remainder of  comprehensive ROS otherwise negative.  HOME MEDS: Current Outpatient Medications on File Prior to Visit  Medication Sig Dispense Refill   amLODipine (NORVASC) 2.5 MG tablet Take 1 tablet (2.5 mg total) by mouth daily. Taking 1/2 tablet daily 45 tablet 1   ASPIRIN 81 PO      cholecalciferol (VITAMIN D) 1000 UNITS tablet Take 1,000 Units by mouth daily.     denosumab (PROLIA) 60 MG/ML SOSY injection      ezetimibe (ZETIA) 10 MG tablet Take 1 tablet (10 mg total) by mouth daily. 90 tablet 1   mometasone (ELOCON) 0.1 % cream Apply 2 times daily to both ears 15 g 1   Multiple Vitamins-Minerals (CENTRUM SILVER 50+WOMEN PO) Take by mouth.     promethazine-dextromethorphan (PROMETHAZINE-DM) 6.25-15 MG/5ML syrup Take 5 mLs by mouth 4 (four) times daily as needed for cough. 180 mL 1   SYNTHROID 50 MCG tablet TAKE ONE TABLET BY MOUTH DAILY ON AN EMPTY STOMACH WITH A FULL GLASS OF WATER AT LEAST 30-60 MINUTES BEFORE BREAKFAST 90 tablet 1   traMADol (ULTRAM) 50 MG tablet Take 1 tablet (50 mg total) by mouth every 8 (eight) hours as needed. 30 tablet 3   tretinoin (RETIN-A) 0.025 % cream APPLY TOPICALLY EVERY NIGHT AT BEDTIME 45 g 1   Continuous Blood Gluc Sensor (FREESTYLE LIBRE 3 SENSOR) MISC Place 1 sensor on the skin every 14 days. Use to check glucose continuously 2 each 0   No current facility-administered medications on file prior to visit.    LABS/IMAGING: No results found for this or any previous visit (from the past 48 hour(s)). No results found.  LIPID PANEL:    Component Value Date/Time   CHOL 197 02/25/2023 1438   TRIG 59.0 02/25/2023 1438   HDL 94.60 02/25/2023 1438   CHOLHDL 2 02/25/2023 1438   VLDL 11.8 02/25/2023 1438   LDLCALC 91 02/25/2023 1438   LDLCALC 43 06/04/2022 0902   LDLDIRECT 92.0 11/19/2015 1633    WEIGHTS: Wt Readings from Last 3 Encounters:  05/05/23 93 lb 3.2 oz (42.3 kg)  05/01/23 92 lb 3.2 oz (41.8 kg)  03/03/23 91 lb 12.8 oz (41.6 kg)    VITALS: BP  123/61 (BP Location: Left Arm, Patient Position: Sitting, Cuff Size: Normal)   Pulse 72   Ht 5\' 3"  (1.6 m)   Wt 93 lb 3.2 oz (42.3 kg)   SpO2 99%   BMI 16.51 kg/m   EXAM: Deferred  EKG: Deferred  ASSESSMENT: Mixed dyslipidemia, goal LDL less than 70 Statin  intolerant-elevated liver enzymes Fibromuscular dysplasia Bleeding diathesis PAD  PLAN: 1.   Ms. Pinks was intolerant of atorvastatin noting transient elevations in liver enzymes which seem to resolve after stopping this medicine.  She says that she saw a hepatologist and there was no underlying liver pathology which is unusual.  Indeed she had a liver ultrasound in September 2023 which was unremarkable.  She is hesitant to try additional statins because of this effect.  Her cholesterol however remains above target.  We discussed trial of Nexletol in combination with her ezetimibe (Nexlizet) which could give her an additional 20 to 25% lipid reduction and would likely get her to target.  There is also cardiovascular risk reduction associated with Nexletol on the order of about 15%.  I do think this will be well-tolerated although can have side effects such as gallstones, tendinitis and exacerbations of gout and pre-existing gout patients.  She is at fairly low risk for this therefore is agreeable to trying the medication.  Will reach out for prior authorization.  If tolerated we will plan repeat lipids in about 3 to 4 months including an LP(a) and NMR.  Thanks again for the kind referral.  Chrystie Nose, MD, [facility]  [place]  [facility]  Medical Director of the Advanced Lipid Disorders &  Cardiovascular Risk Reduction Clinic Diplomate of the American Board of Clinical Lipidology Attending Cardiologist  Direct Dial: [phone]  Fax: [phone]  Website:  [email or web address] 05/06/2023, 8:38 AM

## 2023-05-11 ENCOUNTER — Other Ambulatory Visit (HOSPITAL_COMMUNITY): Payer: Self-pay

## 2023-05-11 ENCOUNTER — Telehealth: Payer: Self-pay

## 2023-05-11 NOTE — Telephone Encounter (Signed)
Pharmacy Patient Advocate Encounter   Received notification from Physician's Office/RN-JENNA E that prior authorization for NEXLIZET} is required/requested.   Insurance verification completed.   The patient is insured through [facility] .   Per test claim: PA required; PA submitted to [employer] via CoverMyMeds Key/confirmation #/EOC [id number]         Status is pending

## 2023-05-12 ENCOUNTER — Other Ambulatory Visit (HOSPITAL_COMMUNITY): Payer: Self-pay

## 2023-05-12 ENCOUNTER — Encounter: Payer: Self-pay | Admitting: Internal Medicine

## 2023-05-12 DIAGNOSIS — I10 Essential (primary) hypertension: Secondary | ICD-10-CM | POA: Diagnosis not present

## 2023-05-12 DIAGNOSIS — Z789 Other specified health status: Secondary | ICD-10-CM | POA: Diagnosis not present

## 2023-05-12 DIAGNOSIS — I773 Arterial fibromuscular dysplasia: Secondary | ICD-10-CM | POA: Diagnosis not present

## 2023-05-12 DIAGNOSIS — E785 Hyperlipidemia, unspecified: Secondary | ICD-10-CM | POA: Diagnosis not present

## 2023-05-12 NOTE — Telephone Encounter (Signed)
Pharmacy Patient Advocate Encounter  Received notification from [facility] that Prior Authorization for Nexlizet has been APPROVED from 05/11/2023 to 05/10/2024. Ran test claim, Copay is $296.27. This test claim was processed through [facility]- copay amounts may vary at other pharmacies due to pharmacy/plan contracts, or as the patient moves through the different stages of their insurance plan.   PA #/Case ID/Reference #: [id number]

## 2023-05-12 NOTE — Telephone Encounter (Signed)
Update sent to patient via MyChart 

## 2023-05-13 DIAGNOSIS — I773 Arterial fibromuscular dysplasia: Secondary | ICD-10-CM | POA: Diagnosis not present

## 2023-05-14 ENCOUNTER — Encounter (INDEPENDENT_AMBULATORY_CARE_PROVIDER_SITE_OTHER): Payer: Self-pay

## 2023-05-14 DIAGNOSIS — H40013 Open angle with borderline findings, low risk, bilateral: Secondary | ICD-10-CM | POA: Diagnosis not present

## 2023-05-14 MED ORDER — NEXLIZET 180-10 MG PO TABS: 1.0000 | ORAL_TABLET | Freq: Every day | ORAL | 11 refills | Status: DC

## 2023-05-14 NOTE — Addendum Note (Signed)
Addended by: Lindell Spar on: 05/14/2023 11:58 AM   Modules accepted: Orders

## 2023-05-14 NOTE — Addendum Note (Signed)
Addended by: Lindell Spar on: 05/14/2023 09:11 AM   Modules accepted: Orders

## 2023-05-14 NOTE — Telephone Encounter (Signed)
Rx(s) sent to pharmacy electronically.  

## 2023-05-25 ENCOUNTER — Other Ambulatory Visit (INDEPENDENT_AMBULATORY_CARE_PROVIDER_SITE_OTHER): Payer: Medicare Other

## 2023-05-25 DIAGNOSIS — R7303 Prediabetes: Secondary | ICD-10-CM | POA: Diagnosis not present

## 2023-05-25 DIAGNOSIS — I6521 Occlusion and stenosis of right carotid artery: Secondary | ICD-10-CM | POA: Diagnosis not present

## 2023-05-25 LAB — COMPREHENSIVE METABOLIC PANEL
ALT: 12 U/L (ref 0–35)
AST: 20 U/L (ref 0–37)
Albumin: 4.4 g/dL (ref 3.5–5.2)
Alkaline Phosphatase: 54 U/L (ref 39–117)
BUN: 25 mg/dL — ABNORMAL HIGH (ref 6–23)
CO2: 28 mEq/L (ref 19–32)
Calcium: 9.7 mg/dL (ref 8.4–10.5)
Chloride: 104 mEq/L (ref 96–112)
Creatinine, Ser: 1.16 mg/dL (ref 0.40–1.20)
GFR: 46.35 mL/min — ABNORMAL LOW (ref >60.00)
Glucose, Bld: 98 mg/dL (ref 70–99)
Potassium: 4.4 mEq/L (ref 3.5–5.1)
Sodium: 141 mEq/L (ref 135–145)
Total Bilirubin: 0.8 mg/dL (ref 0.2–1.2)
Total Protein: 6.8 g/dL (ref 6.0–8.3)

## 2023-05-25 LAB — LIPID PANEL
Cholesterol: 164 mg/dL (ref 0–200)
HDL: 81.1 mg/dL (ref >39.00)
LDL Cholesterol: 66 mg/dL (ref 0–99)
NonHDL: 83.02
Total CHOL/HDL Ratio: 2
Triglycerides: 85 mg/dL (ref 0.0–149.0)
VLDL: 17 mg/dL (ref 0.0–40.0)

## 2023-06-02 ENCOUNTER — Ambulatory Visit (INDEPENDENT_AMBULATORY_CARE_PROVIDER_SITE_OTHER): Payer: Medicare Other | Admitting: Internal Medicine

## 2023-06-02 ENCOUNTER — Encounter: Payer: Self-pay | Admitting: Internal Medicine

## 2023-06-02 VITALS — BP 100/60 | HR 83 | Temp 97.8°F | Ht 63.0 in | Wt 90.6 lb

## 2023-06-02 DIAGNOSIS — I6501 Occlusion and stenosis of right vertebral artery: Secondary | ICD-10-CM | POA: Diagnosis not present

## 2023-06-02 DIAGNOSIS — E441 Mild protein-calorie malnutrition: Secondary | ICD-10-CM

## 2023-06-02 DIAGNOSIS — R7303 Prediabetes: Secondary | ICD-10-CM

## 2023-06-02 DIAGNOSIS — I701 Atherosclerosis of renal artery: Secondary | ICD-10-CM

## 2023-06-02 DIAGNOSIS — R944 Abnormal results of kidney function studies: Secondary | ICD-10-CM

## 2023-06-02 DIAGNOSIS — H04123 Dry eye syndrome of bilateral lacrimal glands: Secondary | ICD-10-CM | POA: Diagnosis not present

## 2023-06-02 DIAGNOSIS — E782 Mixed hyperlipidemia: Secondary | ICD-10-CM | POA: Diagnosis not present

## 2023-06-02 DIAGNOSIS — I150 Renovascular hypertension: Secondary | ICD-10-CM

## 2023-06-02 DIAGNOSIS — I6521 Occlusion and stenosis of right carotid artery: Secondary | ICD-10-CM

## 2023-06-02 DIAGNOSIS — R634 Abnormal weight loss: Secondary | ICD-10-CM | POA: Diagnosis not present

## 2023-06-02 DIAGNOSIS — Z23 Encounter for immunization: Secondary | ICD-10-CM | POA: Diagnosis not present

## 2023-06-02 NOTE — Patient Instructions (Addendum)
I AM SCREENING YOU  FOR SJOGREN'S SYNDROME  THYROID, CBC AND BASIC METABOLIC PANEL TO BE REPEATED TODAY  SERUM COPPER AS WELL   You are due for your 10 yr tetanus-diptheria-pertussis vaccine  booster  (TDaP)   You can get it  for free  at your pharmacy

## 2023-06-02 NOTE — Progress Notes (Signed)
Subjective:  Patient ID: Hailey Hunt, female    DOB: 05-04-1946  Age: 77 y.o. MRN: [id number]  CC: The primary encounter diagnosis was Unintentional weight loss. 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   HPI ZAYLIA SALADINO presents for  Chief Complaint  Patient presents with   Medical Management of Chronic Issues    1) UNDER WEIGHT :  she has had an unintentional weight loss of  4 LBS SINCE LAST VISIT .  Her appetite is good and she eats often.  Up to date on screening.  DIET REVIEWED:  ATKINS SHAKE (BY PREFERENCE OF TASTE)  nature [place] rotien bar plus 5 prunes.    Then a snack.  Lunch is PBJ pon white bread plus a cupf of greek yogurt .  Dinner: big salad with protein ,  or pizza  2 slices   2)  HYPERLIPIDEMIA :  she has been taking  NEXLIZET   LDL 66   3)  DECREASED GFR .  DRINKS AT LEAST 60 OUNCES   4) DRY EYES SINCE   NEEDS SJOGREN'S STUDY    5) SERUM COPPER ELEVATED IN DAUGHTER ERIN.  : WANTS TO BE TESTED   6) FMD:  reviewed recent carotid and renal artery dopplers done at [facility] in mid August . Renal artery ratio has decreased from 3.7 to 3.13 and beading appearance of all arteries was noted.  .    Outpatient Medications Prior to Visit  Medication Sig Dispense Refill   amLODipine (NORVASC) 2.5 MG tablet Take 1 tablet (2.5 mg total) by mouth daily. Taking 1/2 tablet daily 45 tablet 1   ASPIRIN 81 PO      Bempedoic Acid-Ezetimibe (NEXLIZET) 180-10 MG TABS Take 1 tablet by mouth daily. 30 tablet 11   cholecalciferol (VITAMIN D) 1000 UNITS tablet Take 1,000 Units by mouth daily.     denosumab (PROLIA) 60 MG/ML SOSY injection      mometasone  (ELOCON) 0.1 % cream Apply 2 times daily to both ears 15 g 1   Multiple Vitamins-Minerals (CENTRUM SILVER 50+WOMEN PO) Take by mouth. (Patient not taking: Reported on 06/03/2023)     promethazine-dextromethorphan (PROMETHAZINE-DM) 6.25-15 MG/5ML syrup Take 5 mLs by mouth 4 (four) times daily as needed for cough. 180 mL 1   SYNTHROID 50 MCG tablet TAKE ONE TABLET BY MOUTH DAILY ON AN EMPTY STOMACH WITH A FULL GLASS OF WATER AT LEAST 30-60 MINUTES BEFORE BREAKFAST 90 tablet 1   traMADol (ULTRAM) 50 MG tablet Take 1 tablet (50 mg total) by mouth every 8 (eight) hours as needed. 30 tablet 3   tretinoin (RETIN-A) 0.025 % cream APPLY TOPICALLY EVERY NIGHT AT BEDTIME 45 g 1   Continuous Blood Gluc Sensor (FREESTYLE LIBRE 3 SENSOR) MISC Place 1 sensor on the skin every 14 days. Use to check glucose continuously 2 each 0   No facility-administered medications prior to visit.    Review of Systems;  Patient denies headache, fevers, malaise, unintentional weight loss, skin rash, eye pain, sinus congestion and sinus pain, sore throat, dysphagia,  hemoptysis , cough, dyspnea, wheezing, chest pain, palpitations, orthopnea, edema, abdominal pain, nausea, melena, diarrhea, constipation, flank pain, dysuria, hematuria, urinary  Frequency, nocturia, numbness, tingling, seizures,  Focal weakness, Loss of consciousness,  Tremor,  insomnia, depression, anxiety, and suicidal ideation.      Objective:  BP 100/60   Pulse 83   Temp 97.8 F (36.6 C) (Oral)   Ht 5\' 3"  (1.6 m)   Wt 90 lb 9.6 oz (41.1 kg)   SpO2 100%   BMI 16.05 kg/m   BP Readings from Last 3 Encounters:  06/02/23 100/60  05/05/23 123/61  05/01/23 128/72    Wt Readings from Last 3 Encounters:  06/03/23 90 lb 6 oz (41 kg)  06/02/23 90 lb 9.6 oz (41.1 kg)  05/05/23 93 lb 3.2 oz (42.3 kg)    Physical Exam Vitals reviewed.  Constitutional:      General: She is not in acute distress.    Appearance: Normal appearance. She is underweight. She is  not ill-appearing, toxic-appearing or diaphoretic.  HENT:     Head: Normocephalic.  Eyes:     General: No scleral icterus.       Right eye: No discharge.        Left eye: No discharge.     Conjunctiva/sclera: Conjunctivae normal.  Cardiovascular:     Rate and Rhythm: Normal rate and regular rhythm.     Heart sounds: Normal heart sounds.  Pulmonary:     Effort: Pulmonary effort is normal. No respiratory distress.     Breath sounds: Normal breath sounds.  Musculoskeletal:        General: Normal range of motion.  Skin:    General: Skin is warm and dry.  Neurological:     General: No focal deficit present.     Mental Status: She is alert and oriented to person, place, and time. Mental status is at baseline.  Psychiatric:        Mood and Affect: Mood normal.        Behavior: Behavior normal.        Thought Content: Thought content normal.        Judgment: Judgment normal.    Lab Results  Component Value Date   HGBA1C 5.5 03/05/2023   HGBA1C 6.1 12/01/2022   HGBA1C 5.9 07/24/2022    Lab Results  Component Value Date   CREATININE 1.19 06/02/2023   CREATININE 1.16 05/25/2023   CREATININE 1.02 02/25/2023    Lab Results  Component Value Date   WBC 6.9 06/02/2023   HGB 11.9 (L) 06/02/2023   HCT 37.1 06/02/2023   PLT 280.0 06/02/2023   GLUCOSE 106 (H) 06/02/2023   CHOL 164 05/25/2023   TRIG 85.0 05/25/2023   HDL 81.10 05/25/2023   LDLDIRECT 92.0 11/19/2015   LDLCALC 66 05/25/2023   ALT 12 05/25/2023   AST 20 05/25/2023   NA 141 06/02/2023   K 4.2 06/02/2023   CL 103 06/02/2023   CREATININE 1.19 06/02/2023   BUN 33 (H) 06/02/2023   CO2 28 06/02/2023   TSH 4.28 06/02/2023   HGBA1C 5.5 03/05/2023   MICROALBUR 3.0 (H) 11/14/2022    DG Bone Density  Result Date: 01/01/2023 Table formatting from the original result was not included. Date of study: 12/30/2022 Exam: DUAL X-RAY ABSORPTIOMETRY (DXA) FOR BONE MINERAL DENSITY (BMD) Instrument: [employer] Requesting  Provider: PCP Indication: follow up for low BMD Comparison: 2022 (please note that it is not possible to compare data from different instruments) Clinical data: Pt is a 77 y.o. female without previous history of fracture. Results:  Lumbar spine L1-L4 Femoral neck (FN) T-score -0.3 RFN: -2.1 LFN: -2.1 Change in BMD from previous DXA test (%) Down 1.5%  0.0 (*) statistically significant Assessment: By the [facility] Criteria for diagnosis based on bone density, this patient has Low Bone Density FRAX 10-year fracture risk calculator: Not calculated, Patient on Denosumab Comments: the technical quality of the study is good. WHO criteria for diagnosis of osteoporosis in postmenopausal women and in men 31 y/o or older: - normal: T-score -1.0 to + 1.0 - osteopenia/low bone density: T-score between -2.5 and -1.0 - osteoporosis: T-score below -2.5 - severe osteoporosis: T-score below -2.5 with history of fragility fracture Note: although not part of the WHO classification, the presence of a fragility fracture, regardless of the T-score, should be considered diagnostic of osteoporosis, provided other causes for the fracture have been excluded. Follow up BMD is recommended: 2 years. 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  LDL is < 70 .  SHE IS STATIN INTOLERANT     Lab Results  Component Value Date   CHOL 164 05/25/2023   HDL 81.10 05/25/2023   LDLCALC 66 05/25/2023   LDLDIRECT 92.0 11/19/2015   TRIG 85.0  05/25/2023   CHOLHDL 2 05/25/2023      Bilateral dry eyes -     Sjogrens syndrome-A extractable nuclear antibody -     Sjogrens syndrome-B extractable nuclear antibody  Copper metabolism disorder -     Copper, serum  Vertebral artery stenosis, asymptomatic, right Assessment & Plan: Severe, noted during CTA head at [facility] August 2023, with hypoplastic left VA.  Considering endarterectomy recommended by [facility] cardiologist but has deferred referral to [facility].  she is Statin intolerant . LDL is < 70 on current therapy    Mild protein-calorie malnutrition (HCC) Assessment & Plan: Secondary to to aggressive lifestyle modification withlow glycemic index diet introduced to address prediabetes.  Weight is down but stable over the last 2 years     Chronically dry eyes, bilateral Assessment & Plan: Screening for Sjogren's syndrome was negative       I provided 34 minutes of face-to-face time during this encounter reviewing patient's last visit with me, patient's  most recent visit with vascular surgery,  recent surgical and non surgical procedures, previous  labs and imaging studies, counseling on currently addressed issues,  and post visit ordering to diagnostics and therapeutics .   Follow-up: Return in about 6 months (around 11/30/2023).   Sherlene Shams, MD

## 2023-06-02 NOTE — Assessment & Plan Note (Signed)
Tolerating zetia.  LDL is < 70 .  SHE IS STATIN INTOLERANT     Lab Results  Component Value Date   CHOL 164 05/25/2023   HDL 81.10 05/25/2023   LDLCALC 66 05/25/2023   LDLDIRECT 92.0 11/19/2015   TRIG 85.0 05/25/2023   CHOLHDL 2 05/25/2023

## 2023-06-03 ENCOUNTER — Ambulatory Visit (INDEPENDENT_AMBULATORY_CARE_PROVIDER_SITE_OTHER): Payer: Medicare Other | Admitting: *Deleted

## 2023-06-03 VITALS — Ht 63.0 in | Wt 90.4 lb

## 2023-06-03 DIAGNOSIS — Z0000 Encounter for general adult medical examination without abnormal findings: Secondary | ICD-10-CM

## 2023-06-03 DIAGNOSIS — H04123 Dry eye syndrome of bilateral lacrimal glands: Secondary | ICD-10-CM | POA: Insufficient documentation

## 2023-06-03 LAB — BASIC METABOLIC PANEL
BUN: 33 mg/dL — ABNORMAL HIGH (ref 6–23)
CO2: 28 meq/L (ref 19–32)
Calcium: 10.2 mg/dL (ref 8.4–10.5)
Chloride: 103 meq/L (ref 96–112)
Creatinine, Ser: 1.19 mg/dL (ref 0.40–1.20)
GFR: 44.94 mL/min — ABNORMAL LOW (ref >60.00)
Glucose, Bld: 106 mg/dL — ABNORMAL HIGH (ref 70–99)
Potassium: 4.2 meq/L (ref 3.5–5.1)
Sodium: 141 meq/L (ref 135–145)

## 2023-06-03 LAB — CBC WITH DIFFERENTIAL/PLATELET
Basophils Absolute: 0.1 10*3/uL (ref 0.0–0.1)
Basophils Relative: 0.7 % (ref 0.0–3.0)
Eosinophils Absolute: 0.1 10*3/uL (ref 0.0–0.7)
Eosinophils Relative: 1.7 % (ref 0.0–5.0)
HCT: 37.1 % (ref 36.0–46.0)
Hemoglobin: 11.9 g/dL — ABNORMAL LOW (ref 12.0–15.0)
Lymphocytes Relative: 21.5 % (ref 12.0–46.0)
Lymphs Abs: 1.5 10*3/uL (ref 0.7–4.0)
MCHC: 32.1 g/dL (ref 30.0–36.0)
MCV: 91.9 fl (ref 78.0–100.0)
Monocytes Absolute: 0.5 10*3/uL (ref 0.1–1.0)
Monocytes Relative: 6.6 % (ref 3.0–12.0)
Neutro Abs: 4.8 10*3/uL (ref 1.4–7.7)
Neutrophils Relative %: 69.5 % (ref 43.0–77.0)
Platelets: 280 10*3/uL (ref 150.0–400.0)
RBC: 4.03 Mil/uL (ref 3.87–5.11)
RDW: 14.6 % (ref 11.5–15.5)
WBC: 6.9 10*3/uL (ref 4.0–10.5)

## 2023-06-03 LAB — TSH: TSH: 4.28 u[IU]/mL (ref 0.35–5.50)

## 2023-06-03 NOTE — Assessment & Plan Note (Signed)
Severe, noted during CTA head at [facility] August 2023, with hypoplastic left VA.  Considering endarterectomy recommended by [facility] cardiologist but has deferred referral to [facility].  she is Statin intolerant . LDL is < 70 on current therapy

## 2023-06-03 NOTE — Patient Instructions (Signed)
Hailey Hunt , Thank you for taking time to come for your Medicare Wellness Visit. I appreciate your ongoing commitment to your health goals. Please review the following plan we discussed and let me know if I can assist you in the future.   Referrals/Orders/Follow-Ups/Clinician Recommendations: None  This is a list of the screening recommended for you and due dates:  Health Maintenance  Topic Date Due   COVID-19 Vaccine (4 - 2023-24 season) 05/31/2023   Flu Shot  06/23/2023*   DTaP/Tdap/Td vaccine (2 - Td or Tdap) 07/15/2023   Mammogram  03/29/2024   Medicare Annual Wellness Visit  06/02/2024   Colon Cancer Screening  05/26/2026   Pneumonia Vaccine  Completed   DEXA scan (bone density measurement)  Completed   Hepatitis C Screening  Completed   Zoster (Shingles) Vaccine  Completed   HPV Vaccine  Aged Out  *Topic was postponed. The date shown is not the original due date.    Advanced directives: (Copy Requested) Please bring a copy of your health care power of attorney and living will to the office to be added to your chart at your convenience.  Next Medicare Annual Wellness Visit scheduled for next year: Yes 06/06/24 @ 9:00  Managing Pain Without Opioids Opioids are strong medicines used to treat moderate to severe pain. For some people, especially those who have long-term (chronic) pain, opioids may not be the best choice for pain management due to: Side effects like nausea, constipation, and sleepiness. The risk of addiction (opioid use disorder). The longer you take opioids, the greater your risk of addiction. Pain that lasts for more than 3 months is called chronic pain. Managing chronic pain usually requires more than one approach and is often provided by a team of health care providers working together (multidisciplinary approach). Pain management may be done at a pain management center or pain clinic. How to manage pain without the use of opioids Use non-opioid  medicines Non-opioid medicines for pain may include: Over-the-counter or prescription non-steroidal anti-inflammatory drugs (NSAIDs). These may be the first medicines used for pain. They work well for muscle and bone pain, and they reduce swelling. Acetaminophen. This over-the-counter medicine may work well for milder pain but not swelling. Antidepressants. These may be used to treat chronic pain. A certain type of antidepressant (tricyclics) is often used. These medicines are given in lower doses for pain than when used for depression. Anticonvulsants. These are usually used to treat seizures but may also reduce nerve (neuropathic) pain. Muscle relaxants. These relieve pain caused by sudden muscle tightening (spasms). You may also use a pain medicine that is applied to the skin as a patch, cream, or gel (topical analgesic), such as a numbing medicine. These may cause fewer side effects than medicines taken by mouth. Do certain therapies as directed Some therapies can help with pain management. They include: Physical therapy. You will do exercises to gain strength and flexibility. A physical therapist may teach you exercises to move and stretch parts of your body that are weak, stiff, or painful. You can learn these exercises at physical therapy visits and practice them at home. Physical therapy may also involve: Massage. Heat wraps or applying heat or cold to affected areas. Electrical signals that interrupt pain signals (transcutaneous electrical nerve stimulation, TENS). Weak lasers that reduce pain and swelling (low-level laser therapy). Signals from your body that help you learn to regulate pain (biofeedback). Occupational therapy. This helps you to learn ways to function at home and work  with less pain. Recreational therapy. This involves trying new activities or hobbies, such as a physical activity or drawing. Mental health therapy, including: Cognitive behavioral therapy (CBT). This helps  you learn coping skills for dealing with pain. Acceptance and commitment therapy (ACT) to change the way you think and react to pain. Relaxation therapies, including muscle relaxation exercises and mindfulness-based stress reduction. Pain management counseling. This may be individual, family, or group counseling.  Receive medical treatments Medical treatments for pain management include: Nerve block injections. These may include a pain blocker and anti-inflammatory medicines. You may have injections: Near the spine to relieve chronic back or neck pain. Into joints to relieve back or joint pain. Into nerve areas that supply a painful area to relieve body pain. Into muscles (trigger point injections) to relieve some painful muscle conditions. A medical device placed near your spine to help block pain signals and relieve nerve pain or chronic back pain (spinal cord stimulation device). Acupuncture. Follow these instructions at home Medicines Take over-the-counter and prescription medicines only as told by your health care provider. If you are taking pain medicine, ask your health care providers about possible side effects to watch out for. Do not drive or use heavy machinery while taking prescription opioid pain medicine. Lifestyle  Do not use drugs or alcohol to reduce pain. If you drink alcohol, limit how much you have to: 0-1 drink a day for women who are not pregnant. 0-2 drinks a day for men. Know how much alcohol is in a drink. In the [place] one drink equals one 12 oz bottle of beer (355 mL), one 5 oz glass of wine (148 mL), or one 1 oz glass of hard liquor (44 mL). Do not use any products that contain nicotine or tobacco. These products include cigarettes, chewing tobacco, and vaping devices, such as e-cigarettes. If you need help quitting, ask your health care provider. Eat a healthy diet and maintain a healthy weight. Poor diet and excess weight may make pain worse. Eat foods that  are high in fiber. These include fresh fruits and vegetables, whole grains, and beans. Limit foods that are high in fat and processed sugars, such as fried and sweet foods. Exercise regularly. Exercise lowers stress and may help relieve pain. Ask your health care provider what activities and exercises are safe for you. If your health care provider approves, join an exercise class that combines movement and stress reduction. Examples include yoga and tai chi. Get enough sleep. Lack of sleep may make pain worse. Lower stress as much as possible. Practice stress reduction techniques as told by your therapist. General instructions Work with all your pain management providers to find the treatments that work best for you. You are an important member of your pain management team. There are many things you can do to reduce pain on your own. Consider joining an online or in-person support group for people who have chronic pain. Keep all follow-up visits. This is important. Where to find more information You can find more information about managing pain without opioids from: American Academy of Pain Medicine: painmed.org Institute for Chronic Pain: instituteforchronicpain.org American Chronic Pain Association: theacpa.org Contact a health care provider if: You have side effects from pain medicine. Your pain gets worse or does not get better with treatments or home therapy. You are struggling with anxiety or depression. Summary Many types of pain can be managed without opioids. Chronic pain may respond better to pain management without opioids. Pain is best managed  when you and a team of health care providers work together. Pain management without opioids may include non-opioid medicines, medical treatments, physical therapy, mental health therapy, and lifestyle changes. Tell your health care providers if your pain gets worse or is not being managed well enough. This information is not intended to  replace advice given to you by your health care provider. Make sure you discuss any questions you have with your health care provider. Document Revised: 12/26/2020 Document Reviewed: 12/26/2020 Elsevier Patient Education  2024 [employer].

## 2023-06-03 NOTE — Progress Notes (Addendum)
Subjective:   Hailey Hunt is a 77 y.o. female who presents for Medicare Annual (Subsequent) preventive examination.  Visit Complete: Virtual  I connected with  Laural Roes on 06/03/23 by a audio enabled telemedicine application and verified that I am speaking with the correct person using two identifiers.  Patient Location: Home  Provider Location: Office/Clinic  I discussed the limitations of evaluation and management by telemedicine. The patient expressed understanding and agreed to proceed.  Patient Medicare AWV questionnaire was completed by the patient on 05/31/23; I have confirmed that all information answered by patient is correct and no changes since this date.  Vital Signs: Unable to obtain new vitals due to this being a telehealth visit.   Review of Systems     Cardiac Risk Factors include: dyslipidemia;hypertension;Other (see comment), Risk factor comments: PSVT     Objective:    Today's Vitals   06/03/23 0907  Weight: 90 lb 6 oz (41 kg)  Height: 5\' 3"  (1.6 m)   Body mass index is 16.01 kg/m.     06/03/2023    9:24 AM 05/20/2022    3:25 PM 05/15/2021    9:15 AM 05/14/2020    9:10 AM 05/12/2019    9:15 AM 05/10/2018    9:41 AM 12/17/2016    3:20 PM  Advanced Directives  Does Patient Have a Medical Advance Directive? Yes Yes Yes Yes Yes Yes Yes  Type of Estate agent of [place];Living will Healthcare Power of [place];Living will Healthcare Power of [place];Living will Healthcare Power of [place];Living will Healthcare Power of [place];Living will Healthcare Power of [place];Living will Healthcare Power of [place];Living will  Does patient want to make changes to medical advance directive?  No - Patient declined No - Patient declined No - Patient declined No - Patient declined No - Patient declined No - Patient declined  Copy of Healthcare Power of Attorney in Chart? No - copy requested No - copy requested No - copy requested No -  copy requested No - copy requested No - copy requested No - copy requested    Current Medications (verified) Outpatient Encounter Medications as of 06/03/2023  Medication Sig   amLODipine (NORVASC) 2.5 MG tablet Take 1 tablet (2.5 mg total) by mouth daily. Taking 1/2 tablet daily   ASPIRIN 81 PO    Bempedoic Acid-Ezetimibe (NEXLIZET) 180-10 MG TABS Take 1 tablet by mouth daily.   cholecalciferol (VITAMIN D) 1000 UNITS tablet Take 1,000 Units by mouth daily.   denosumab (PROLIA) 60 MG/ML SOSY injection    mometasone (ELOCON) 0.1 % cream Apply 2 times daily to both ears   promethazine-dextromethorphan (PROMETHAZINE-DM) 6.25-15 MG/5ML syrup Take 5 mLs by mouth 4 (four) times daily as needed for cough.   SYNTHROID 50 MCG tablet TAKE ONE TABLET BY MOUTH DAILY ON AN EMPTY STOMACH WITH A FULL GLASS OF WATER AT LEAST 30-60 MINUTES BEFORE BREAKFAST   traMADol (ULTRAM) 50 MG tablet Take 1 tablet (50 mg total) by mouth every 8 (eight) hours as needed.   tretinoin (RETIN-A) 0.025 % cream APPLY TOPICALLY EVERY NIGHT AT BEDTIME   Multiple Vitamins-Minerals (CENTRUM SILVER 50+WOMEN PO) Take by mouth. (Patient not taking: Reported on 06/03/2023)   No facility-administered encounter medications on file as of 06/03/2023.    Allergies (verified) Voltaren [diclofenac], Adhesive [tape], Atorvastatin, Demerol, Macrodantin, Nsaids, Voltaren [diclofenac sodium], Codeine, Nickel, Penicillins, and Sulfa antibiotics   History: Past Medical History:  Diagnosis Date   Aberrant right subclavian artery  Cataract    surgery   Clotting disorder (HCC)    hx of bleeding after vaginal delivery, dr told her not to have any surgeries that are necessary   Dysplasia, artery, fibromuscular, renal (HCC)    Fibromuscular dysplasia (HCC)    Fibromuscular dysplasia of renal artery (HCC)    managed by Dr. Melvern Sample [facility]   Graves disease    Heart murmur    Heart murmur    Hypertension    Kidney stone    Migraines     Neuropathy with IgA monoclonal gammopathy (HCC)    Dr Ellis Savage   Paget's disease of the bone    Pernicious anemia    Thyroid disease    Urinary incontinence, mixed    resolved with removal of ained sutures in bladder    Past Surgical History:  Procedure Laterality Date   BILATERAL SALPINGOOPHORECTOMY     CATARACT EXTRACTION  Sep 10, 2011, Jan 13,2013   COLONOSCOPY     RENAL BIOPSY     TONSILLECTOMY     VAGINAL DELIVERY     x2   VAGINAL HYSTERECTOMY     WISDOM TOOTH EXTRACTION     Family History  Problem Relation Age of Onset   Anuerysm Mother    COPD Mother    Hypertension Mother    Heart disease Mother    Heart failure Mother    Cancer Father        lung CA   Heart disease Father    Heart attack Father    Heart disease Brother        viral cardiomyopathy, cocaine use   Drug abuse Brother    Cancer Maternal Aunt        esophageal and live rCA,  hisotory of  ETOH and tobacco   Anuerysm Maternal Grandfather    Cancer Paternal Grandmother        brain tumor   Diabetes Sister    Colon cancer Paternal Uncle    Social History   Socioeconomic History   Marital status: Married    Spouse name: Not on file   Number of children: 2   Years of education: Not on file   Highest education level: Associate degree: occupational, Scientist, product/process development, or vocational program  Occupational History   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  Tobacco Use   Smoking status: Never   Smokeless tobacco: Never  Vaping Use   Vaping status: Never Used  Substance and Sexual Activity   Alcohol use: Yes    Comment: once a year   Drug use: No   Sexual activity: Not Currently  Other Topics Concern   Not on file  Social History Narrative   Married   Social Determinants of Health   Financial Resource Strain: Low Risk  (05/31/2023)   Overall Financial Resource Strain (CARDIA)    Difficulty of Paying Living Expenses: Not hard at all  Food Insecurity: No Food Insecurity (05/31/2023)   Hunger Vital Sign     Worried About Running Out of Food in the Last Year: Never true    Ran Out of Food in the Last Year: Never true  Transportation Needs: No Transportation Needs (05/31/2023)   PRAPARE - Administrator, Civil Service (Medical): No    Lack of Transportation (Non-Medical): No  Physical Activity: Sufficiently Active (05/31/2023)   Exercise Vital Sign    Days of Exercise per Week: 7 days    Minutes of Exercise per Session: 40 min  Stress: No Stress  Concern Present (05/31/2023)   [employer] of Occupational Health - Occupational Stress Questionnaire    Feeling of Stress : Only a little  Social Connections: Moderately Integrated (05/31/2023)   Social Connection and Isolation Panel [NHANES]    Frequency of Communication with Friends and Family: More than three times a week    Frequency of Social Gatherings with Friends and Family: Twice a week    Attends Religious Services: Never    Database administrator or Organizations: Yes    Attends Engineer, structural: Patient declined    Marital Status: Married    Tobacco Counseling Counseling given: Not Answered   Clinical Intake:  Pre-visit preparation completed: Yes  Pain : No/denies pain     BMI - recorded: 16.01 Nutritional Status: BMI <19  Underweight Nutritional Risks: None Diabetes: No  How often do you need to have someone help you when you read instructions, pamphlets, or other written materials from your doctor or pharmacy?: 1 - Never  Interpreter Needed?: No  Information entered by :: R. Aanshi Batchelder LPN   Activities of Daily Living    05/31/2023    2:35 PM  In your present state of health, do you have any difficulty performing the following activities:  Hearing? 0  Vision? 0  Difficulty concentrating or making decisions? 0  Walking or climbing stairs? 0  Dressing or bathing? 0  Doing errands, shopping? 0  Preparing Food and eating ? N  Using the Toilet? N  In the past six months, have you accidently leaked  urine? Y  Do you have problems with loss of bowel control? N  Managing your Medications? N  Managing your Finances? N  Housekeeping or managing your Housekeeping? N    Patient Care Team: Sherlene Shams, MD as PCP - General (Internal Medicine) Ellis Savage Young Berry, MD (Nephrology) Antonieta Iba, MD as Consulting Physician (Cardiology)  Indicate any recent Medical Services you may have received from other than [facility] providers in the past year (date may be approximate).     Assessment:   This is a routine wellness examination for [place].  Hearing/Vision screen Hearing Screening - Comments:: Wears aids Vision Screening - Comments:: Glasses  Dietary issues and exercise activities discussed:     Goals Addressed             This Visit's Progress    Patient Stated       Wants to gain 10 pounds in the next year       Depression Screen    06/03/2023    9:17 AM 12/01/2022    8:04 AM 11/14/2022    9:02 AM 10/30/2022    8:01 AM 06/04/2022    8:17 AM 05/20/2022    3:49 PM 11/28/2021    8:30 AM  PHQ 2/9 Scores  PHQ - 2 Score 0 0 0 0 0 0 0  PHQ- 9 Score 0      0    Fall Risk    05/31/2023    2:35 PM 03/03/2023    1:36 PM 12/01/2022    8:04 AM 11/14/2022    9:02 AM 10/30/2022    8:01 AM  Fall Risk   Falls in the past year? 0 0 0 0 0  Number falls in past yr: 0 0 0 0 0  Injury with Fall? 0 0 0 0 0  Risk for fall due to :  No Fall Risks No Fall Risks No Fall Risks No Fall Risks  Follow  up Falls evaluation completed;Falls prevention discussed Falls evaluation completed Falls evaluation completed Falls evaluation completed Falls evaluation completed    MEDICARE RISK AT HOME: Medicare Risk at Home Any stairs in or around the home?: Yes If so, are there any without handrails?: No Home free of loose throw rugs in walkways, pet beds, electrical cords, etc?: Yes Adequate lighting in your home to reduce risk of falls?: Yes Life alert?: Yes Use of a cane, walker or w/c?: No Grab bars in  the bathroom?: Yes Shower chair or bench in shower?: Yes Elevated toilet seat or a handicapped toilet?: Yes   Cognitive Function:    05/14/2020    9:21 AM 05/10/2018    9:48 AM 12/17/2016    3:30 PM  MMSE - Mini Mental State Exam  Not completed: Unable to complete    Orientation to time  5 5  Orientation to Place  5 5  Registration  3 3  Attention/ Calculation  5 5  Recall  3 3  Language- name 2 objects  2 2  Language- repeat  1 1  Language- follow 3 step command  3 3  Language- read & follow direction  1 1  Write a sentence  1 1  Copy design  1 1  Total score  30 30        06/03/2023    9:25 AM 05/20/2022    4:19 PM 05/12/2019    9:14 AM  6CIT Screen  What Year? 0 points  0 points  What month? 0 points  0 points  What time? 0 points  0 points  Count back from 20 0 points  0 points  Months in reverse 0 points 0 points 0 points  Repeat phrase 0 points  0 points  Total Score 0 points  0 points    Immunizations Immunization History  Administered Date(s) Administered   Fluad Quad(high Dose 65+) 05/30/2019   Influenza Nasal 07/09/2017   Influenza Split 07/13/2012, 07/13/2014, 05/19/2022   Influenza, High Dose Seasonal PF 07/21/2018, 06/07/2021   Influenza-Unspecified 07/04/2013, 06/30/2015, 07/14/2016, 05/30/2019, 06/22/2020, 05/25/2022   Moderna Sars-Covid-2 Vaccination 11/12/2019, 12/10/2019, 07/19/2020   PPD Test 11/19/2015   Pneumococcal Conjugate-13 07/28/2013   Pneumococcal Polysaccharide-23 07/07/2003, 07/06/2014   Respiratory Syncytial Virus Vaccine,Recomb Aduvanted(Arexvy) 06/06/2022   Tdap 07/14/2013   Zoster Recombinant(Shingrix) 04/10/2018, 06/07/2018   Zoster, Live 09/14/2008    TDAP status: Up to date  Flu Vaccine status: Up to date  Pneumococcal vaccine status: Up to date  Covid-19 vaccine status: Completed vaccines  Qualifies for Shingles Vaccine? Yes   Zostavax completed Yes   Shingrix Completed?: Yes  Screening Tests Health Maintenance   Topic Date Due   Medicare Annual Wellness (AWV)  05/21/2023   COVID-19 Vaccine (4 - 2023-24 season) 05/31/2023   INFLUENZA VACCINE  06/23/2023 (Originally 04/30/2023)   DTaP/Tdap/Td (2 - Td or Tdap) 07/15/2023   MAMMOGRAM  03/29/2024   Colonoscopy  05/26/2026   Pneumonia Vaccine 39+ Years old  Completed   DEXA SCAN  Completed   Hepatitis C Screening  Completed   Zoster Vaccines- Shingrix  Completed   HPV VACCINES  Aged Out    Health Maintenance  Health Maintenance Due  Topic Date Due   Medicare Annual Wellness (AWV)  05/21/2023   COVID-19 Vaccine (4 - 2023-24 season) 05/31/2023    Colorectal cancer screening: Type of screening: Colonoscopy. Completed 8/17. Repeat every 10 years  Mammogram status: Completed 7/24. Repeat every year  Bone Density status: Completed 4/24.  Results reflect: Bone density results: OSTEOPOROSIS. Repeat every 2 years.  Lung Cancer Screening: (Low Dose CT Chest recommended if Age 38-80 years, 20 pack-year currently smoking OR have quit w/in 15years.) does not qualify.     Additional Screening:  Hepatitis C Screening: does qualify; Completed 9/23  Vision Screening: Recommended annual ophthalmology exams for early detection of glaucoma and other disorders of the eye. Is the patient up to date with their annual eye exam?  Yes  Who is the provider or what is the name of the office in which the patient attends annual eye exams? [facility] If pt is not established with a provider, would they like to be referred to a provider to establish care? No .   Dental Screening: Recommended annual dental exams for proper oral hygiene   Community Resource Referral / Chronic Care Management: CRR required this visit?  No   CCM required this visit?  No     Plan:     I have personally reviewed and noted the following in the patient's chart:   Medical and social history Use of alcohol, tobacco or illicit drugs  Current medications and supplements  including opioid prescriptions. Patient is currently taking opioid prescriptions. Information provided to patient regarding non-opioid alternatives. Patient advised to discuss non-opioid treatment plan with their provider. Functional ability and status Nutritional status Physical activity Advanced directives List of other physicians Hospitalizations, surgeries, and ER visits in previous 12 months Vitals Screenings to include cognitive, depression, and falls Referrals and appointments  In addition, I have reviewed and discussed with patient certain preventive protocols, quality metrics, and best practice recommendations. A written personalized care plan for preventive services as well as general preventive health recommendations were provided to patient.     Sydell Axon, LPN   01/03/8294   After Visit Summary: (MyChart) Due to this being a telephonic visit, the after visit summary with patients personalized plan was offered to patient via MyChart   Nurse Notes: None     I have reviewed the above information and agree with above.   Duncan Dull, MD

## 2023-06-03 NOTE — Assessment & Plan Note (Signed)
Secondary to to aggressive lifestyle modification withlow glycemic index diet introduced to address prediabetes.  Weight is down but stable over the last 2 years

## 2023-06-03 NOTE — Assessment & Plan Note (Signed)
Screening for Sjogren's syndrome was negative

## 2023-06-03 NOTE — Assessment & Plan Note (Signed)
GFR has been 59 ml/min until  recently and now < 50 ml /min    Lab Results  Component Value Date   CREATININE 1.19 06/02/2023

## 2023-06-05 LAB — COPPER, SERUM

## 2023-06-05 LAB — SJOGRENS SYNDROME-B EXTRACTABLE NUCLEAR ANTIBODY: SSB (La) (ENA) Antibody, IgG: <1 AI

## 2023-06-05 LAB — SJOGRENS SYNDROME-A EXTRACTABLE NUCLEAR ANTIBODY: SSA (Ro) (ENA) Antibody, IgG: <1 AI

## 2023-06-06 ENCOUNTER — Encounter: Payer: Self-pay | Admitting: Internal Medicine

## 2023-06-16 DIAGNOSIS — Z23 Encounter for immunization: Secondary | ICD-10-CM | POA: Diagnosis not present

## 2023-07-03 ENCOUNTER — Other Ambulatory Visit: Payer: Self-pay

## 2023-07-03 MED ORDER — NEXLIZET 180-10 MG PO TABS: 1.0000 | ORAL_TABLET | Freq: Every day | ORAL | 3 refills | Status: DC

## 2023-07-28 ENCOUNTER — Ambulatory Visit (INDEPENDENT_AMBULATORY_CARE_PROVIDER_SITE_OTHER): Payer: Medicare Other | Admitting: Internal Medicine

## 2023-07-28 ENCOUNTER — Encounter: Payer: Self-pay | Admitting: Internal Medicine

## 2023-07-28 VITALS — BP 138/70 | HR 74 | Ht 63.0 in | Wt 93.8 lb

## 2023-07-28 DIAGNOSIS — M810 Age-related osteoporosis without current pathological fracture: Secondary | ICD-10-CM

## 2023-07-28 DIAGNOSIS — M8889 Osteitis deformans of multiple sites: Secondary | ICD-10-CM

## 2023-07-28 DIAGNOSIS — E890 Postprocedural hypothyroidism: Secondary | ICD-10-CM

## 2023-07-28 NOTE — Progress Notes (Unsigned)
Patient ID: KEYATTA WATERHOUSE, female   DOB: 10-04-1945, 77 y.o.   MRN: [id number]  HPI  TELENA SCHNAPP is a 77 y.o.-year-old female, returning for follow-up for Paget ds, osteopenia/osteoporosis, postablative hypothyroidism, prediabetes.  Last visit 1 year ago.  Interim history: No falls or fractures since last visit.  She continues to walk - now 2 miles every day in the morning (prev. 5).  No hip pain or joint pains. Her weight decreased 11 lbs before last visit that she was not able to eat well.  She gained 8 pounds since then. She started Nexletol, then Nexlizet  in 09/2022. She was dx'ed with von Willebrandt ds. Since last OV (at [facility]).  Reviewed history: She had a CTA abd/pelvis on 06/2015:incidental lesion:  Diffuse patchy sclerosis and cortical thickening of the left iliac bone, favored to represent Paget's disease.  She has had an anteverted L femur since her 52s. She has had pain in that his ever since.    Bone scan (01/02/2016): There is asymmetric increased uptake throughout the left iliac bone. The appearance and distribution is compatible with Paget's disease. Increased uptake in the distribution of the nasal bone is also noted, nonspecific. This could be seen with Paget's disease, fibrous dysplasia or chronic sinusitis. No findings identified to suggest bone metastases. Physiologic uptake is noted within the kidneys and urinary bladder.   IMPRESSION: 1. Increased uptake within the left iliac bone compatible with Paget's disease. 2. Central area of increased uptake in the region of the nasal bone which may also reflect Paget's disease of the skull.   Per alkaline phosphatase levels are in the normal range: Lab Results  Component Value Date   ALKPHOS 54 05/25/2023   ALKPHOS 65 02/25/2023   ALKPHOS 72 12/23/2022   ALKPHOS 66 09/01/2022   ALKPHOS 144 (H) 06/25/2022   ALKPHOS 235 (H) 06/18/2022   ALKPHOS 381 (H) 06/11/2022   ALKPHOS 200 (H) 06/04/2022   ALKPHOS  64 11/28/2021   ALKPHOS 60 05/31/2021   She previously had elevated LFTs after being dx'ed with atherosclerosis at [facility] >> started on Lipitor.  She developed increased LFTs, extensively investigated by PCP and hepatologist.  She was taken off the statin.  Also off vitamin D. Recent LFTs were normal: Lab Results  Component Value Date   ALT 12 05/25/2023   AST 20 05/25/2023   ALKPHOS 54 05/25/2023   BILITOT 0.8 05/25/2023   Previously: Component     Latest Ref Rng 06/04/2022 06/11/2022 06/18/2022 06/25/2022  Total Bilirubin     0.2 - 1.2 mg/dL 1.0  1.4 (H)  0.9  0.8   Alkaline Phosphatase     39 - 117 U/L 200 (H)  381 (H)  235 (H)  144 (H)   AST     0 - 37 U/L 199 (H)  167 (H)  39 (H)  29   ALT     0 - 35 U/L 163 (H)  195 (H)  65 (H)  28    Osteopenia/osteoporosis:  Reviewed previous bone density scan reports:  12/30/2022 ([place])  Lumbar spine L1-L4 Femoral neck (FN)  T-score -0.3 RFN: -2.1 LFN: -2.1  Change in BMD from previous DXA test (%) -1.5% 0.0  (*) statistically significant   12/30/2020 ([place]) Lumbar spine L1-L4 Femoral neck (FN) 33% distal radius   -0.1 RFN: -2.1 LFN: -2.1 n/a  Change in BMD from previous DXA test (%) +4.2%* +4.0% n/a  (*) statistically significant  Date L1-L4 T score FN  T scores FRAX score  12/21/2018 ([place]) -0.5 (-1.6%) RFN: -2.4 LFN: -2.2  (-3%) 10-year MOF: 10.8% 10-year hip fracture risk: 3  12/16/2016 ([place]) -0.40 RFN:-2.2 LFN:-2.1   01/22/2012 ([facility])  -0.60 LFN: -0.66     She had a traumatic vertebral fracture as a child.  She has a history of dizziness, but not recently.  Previous osteoporosis treatments: - Actonel for 2 years. This was apparently stopped due to recurrent kidney stones -last episode 2010.  She did not feel good on this (stomach upset, weakness, fatigue). - Prolia - 08/10/2019, 02/08/2020, 08/30/2020, 03/08/2021, 09/11/2021, 03/28/2022, 10/29/2022, 05/01/2023.  Latest vitamin D levels have been normal, she  previously had a low level in 2013: Lab Results  Component Value Date   VD25OH 55.86 09/01/2022   VD25OH 71.44 11/28/2021   VD25OH 43.19 05/31/2021   VD25OH 52.64 11/28/2020   VD25OH 38.5 07/18/2020   VD25OH 55.06 12/30/2018   VD25OH 60.99 02/02/2018   VD25OH 55.42 07/13/2017   VD25OH 49.37 12/12/2016   VD25OH 49.43 11/19/2015   She has been on 1000 units vitamin D daily for years - then a MVI. Off due to elevated LFTs at last visit. Now back on it.  She continues to walk. In the past, During this walks, she talks on the telephone with her sister in [place], who is also walking.  She also has an apple watch and completes all the circles every day.  She had surgical menopause at 77 years old.   Pt does not have a FH of osteoporosis.  No history of hypo or hypercalcemia or hyperparathyroidism: Lab Results  Component Value Date   PTH 38 05/31/2021   CALCIUM 10.2 06/02/2023   CALCIUM 9.7 05/25/2023   CALCIUM 9.5 02/25/2023   CALCIUM 9.1 01/07/2023   CALCIUM 9.8 12/23/2022   CALCIUM 9.0 12/01/2022   CALCIUM 8.7 09/01/2022   CALCIUM 9.0 06/25/2022   CALCIUM 8.9 06/11/2022   CALCIUM 9.3 06/04/2022   She has a history of Graves' disease status post RAI treatment in the 74s.  She developed post ablative hypothyroidism.  Pt is on Synthroid 50 mcg daily, taken: - in am (at 4:30 am - then goes back to sleep) - fasting - at least 30 min from b'fast - no Ca, Fe, MVI, PPIs - not on Biotin  On vitamin B12.  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; latest BUN/creatinine: Lab Results  Component Value Date   BUN 33 (H) 06/02/2023   CREATININE 1.19 06/02/2023   Prediabetes: Reviewed her HbA1c levels: Lab Results  Component Value Date   HGBA1C 5.5 03/05/2023   HGBA1C 6.1 12/01/2022   HGBA1C 5.9 07/24/2022    HGBA1C 5.4 07/18/2021   HGBA1C 5.8 11/28/2020   HGBA1C 6.0 05/25/2020   HGBA1C 5.9 05/23/2019   HGBA1C 5.8 11/26/2018   HGBA1C 5.8 08/17/2018   HGBA1C 6.2 05/10/2018   Investigation for type 1 diabetes was negative: Component     Latest Ref Rng & Units 06/30/2018  C-Peptide     0.80 - 3.85 ng/mL 2.45  Glucose, Plasma     65 - 99 mg/dL 93  ZNT8 Antibodies      negative  Glutamic Acid Decarb Ab     <5 IU/mL <5  Islet Cell Ab  Neg:<1:1 Negative   ROS: + See HPI  I reviewed pt's medications, allergies, PMH, social hx, family hx, and changes were documented in the history of present illness. Otherwise, unchanged from my initial visit note.  Past Medical History:  Diagnosis Date   Aberrant right subclavian artery    Cataract    surgery   Clotting disorder (HCC)    hx of bleeding after vaginal delivery, dr told her not to have any surgeries that are necessary   Dysplasia, artery, fibromuscular, renal (HCC)    Fibromuscular dysplasia (HCC)    Fibromuscular dysplasia of renal artery (HCC)    managed by Dr. Melvern Sample [facility]   Graves disease    Heart murmur    Heart murmur    Hypertension    Kidney stone    Migraines    Neuropathy with IgA monoclonal gammopathy (HCC)    Dr Ellis Savage   Paget's disease of the bone    Pernicious anemia    Thyroid disease    Urinary incontinence, mixed    resolved with removal of ained sutures in bladder    Past Surgical History:  Procedure Laterality Date   BILATERAL SALPINGOOPHORECTOMY     CATARACT EXTRACTION  Sep 10, 2011, Jan 13,2013   COLONOSCOPY     RENAL BIOPSY     TONSILLECTOMY     VAGINAL DELIVERY     x2   VAGINAL HYSTERECTOMY     WISDOM TOOTH EXTRACTION     Social History   Socioeconomic History   Marital status: Married    Spouse name: Not on file   Number of children: 2   Years of education: Not on file   Highest education level: Associate degree: occupational, Scientist, product/process development, or vocational program  Occupational  History   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  Tobacco Use   Smoking status: Never   Smokeless tobacco: Never  Vaping Use   Vaping status: Never Used  Substance and Sexual Activity   Alcohol use: Yes    Comment: once a year   Drug use: No   Sexual activity: Not Currently  Other Topics Concern   Not on file  Social History Narrative   Married   Social Determinants of Health   Financial Resource Strain: Low Risk  (05/31/2023)   Overall Financial Resource Strain (CARDIA)    Difficulty of Paying Living Expenses: Not hard at all  Food Insecurity: No Food Insecurity (05/31/2023)   Hunger Vital Sign    Worried About Running Out of Food in the Last Year: Never true    Ran Out of Food in the Last Year: Never true  Transportation Needs: No Transportation Needs (05/31/2023)   PRAPARE - Administrator, Civil Service (Medical): No    Lack of Transportation (Non-Medical): No  Physical Activity: Sufficiently Active (05/31/2023)   Exercise Vital Sign    Days of Exercise per Week: 7 days    Minutes of Exercise per Session: 40 min  Stress: No Stress Concern Present (05/31/2023)   [employer] of Occupational Health - Occupational Stress Questionnaire    Feeling of Stress : Only a little  Social Connections: Moderately Integrated (05/31/2023)   Social Connection and Isolation Panel [NHANES]    Frequency of Communication with Friends and Family: More than three times a week    Frequency of Social Gatherings with Friends and Family: Twice a week    Attends Religious Services: Never    Database administrator or Organizations: Yes  Attends Banker Meetings: Patient declined    Marital Status: Married  Catering manager Violence: Not At Risk (06/03/2023)   Humiliation, Afraid, Rape, and Kick questionnaire    Fear of Current or Ex-Partner: No    Emotionally Abused: No    Physically Abused: No    Sexually Abused: No   Current Outpatient Medications on File Prior to Visit   Medication Sig Dispense Refill   amLODipine (NORVASC) 2.5 MG tablet Take 1 tablet (2.5 mg total) by mouth daily. Taking 1/2 tablet daily 45 tablet 1   ASPIRIN 81 PO      Bempedoic Acid-Ezetimibe (NEXLIZET) 180-10 MG TABS Take 1 tablet by mouth daily. 90 tablet 3   cholecalciferol (VITAMIN D) 1000 UNITS tablet Take 1,000 Units by mouth daily.     denosumab (PROLIA) 60 MG/ML SOSY injection      mometasone (ELOCON) 0.1 % cream Apply 2 times daily to both ears 15 g 1   Multiple Vitamins-Minerals (CENTRUM SILVER 50+WOMEN PO) Take by mouth. (Patient not taking: Reported on 06/03/2023)     promethazine-dextromethorphan (PROMETHAZINE-DM) 6.25-15 MG/5ML syrup Take 5 mLs by mouth 4 (four) times daily as needed for cough. 180 mL 1   SYNTHROID 50 MCG tablet TAKE ONE TABLET BY MOUTH DAILY ON AN EMPTY STOMACH WITH A FULL GLASS OF WATER AT LEAST 30-60 MINUTES BEFORE BREAKFAST 90 tablet 1   traMADol (ULTRAM) 50 MG tablet Take 1 tablet (50 mg total) by mouth every 8 (eight) hours as needed. 30 tablet 3   tretinoin (RETIN-A) 0.025 % cream APPLY TOPICALLY EVERY NIGHT AT BEDTIME 45 g 1   No current facility-administered medications on file prior to visit.   Allergies  Allergen Reactions   Voltaren [Diclofenac] Hypertension   Adhesive [Tape]    Atorvastatin Other (See Comments)   Demerol     HYPOTENSION   Macrodantin    Nsaids Hypertension   Voltaren [Diclofenac Sodium] Other (See Comments)    FMD, increased bp   Codeine Nausea And Vomiting and Rash   Nickel Rash   Penicillins Rash   Sulfa Antibiotics Rash    Fever   Family History  Problem Relation Age of Onset   Anuerysm Mother    COPD Mother    Hypertension Mother    Heart disease Mother    Heart failure Mother    Cancer Father        lung CA   Heart disease Father    Heart attack Father    Heart disease Brother        viral cardiomyopathy, cocaine use   Drug abuse Brother    Cancer Maternal Aunt        esophageal and live rCA,  hisotory  of  ETOH and tobacco   Anuerysm Maternal Grandfather    Cancer Paternal Grandmother        brain tumor   Diabetes Sister    Colon cancer Paternal Uncle    PE: BP 138/70   Pulse 74   Ht 5\' 3"  (1.6 m)   Wt 93 lb 12.8 oz (42.5 kg)   SpO2 96%   BMI 16.62 kg/m   Wt Readings from Last 15 Encounters:  07/28/23 93 lb 12.8 oz (42.5 kg)  06/03/23 90 lb 6 oz (41 kg)  06/02/23 90 lb 9.6 oz (41.1 kg)  05/05/23 93 lb 3.2 oz (42.3 kg)  05/01/23 92 lb 3.2 oz (41.8 kg)  03/03/23 91 lb 12.8 oz (41.6 kg)  12/11/22 87 lb (39.5  kg)  12/01/22 86 lb 3.2 oz (39.1 kg)  11/14/22 85 lb 9.6 oz (38.8 kg)  10/30/22 88 lb 9.6 oz (40.2 kg)  10/29/22 89 lb 9.6 oz (40.6 kg)  07/24/22 85 lb 6.4 oz (38.7 kg)  06/04/22 88 lb 6.4 oz (40.1 kg)  05/20/22 96 lb (43.5 kg)  01/22/22 96 lb (43.5 kg)    Constitutional: thin, in NAD Eyes:  EOMI, no exophthalmos ENT: no thyromegaly, no cervical lymphadenopathy Cardiovascular: RRR, No MRG Respiratory: CTA B Musculoskeletal: no deformities Skin: moist, warm, no rashes Neurological: + tremor with outstretched hands (chronic)  Assessment: 1. Paget disease  2.  Clinical osteoporosis  3. Postablative hypothyroidism - RAI tx for Graves ds. In 1980s  4.  Prediabetes  Plan: 1. Paget ds. -Stable, without pain or warmth at the site of her pagetoid lesions -She initially had a lesion in the left hip observed incidentally on pelvic CTA at [facility]. Her FMD is managed at [facility].  This was again noticed on bone scan from 2017, along with a nasal focus.  She did not have indication of new lesions since then. -We discussed that Paget's disease lesions do not need to be treated unless they are painful, contiguous with the joint, or in weightbearing joints.  Also, consideration for treatment should be given in case alkaline phosphatase increases.  Her alkaline phosphatase levels have been normal, but since last visit, after starting a statin, she had an increase in her LFTs, which  was further extensively investigated with negative results.  Latest LFTs were reviewed and these were normal 04/2023.  She is feeling much better off the statin.  Currently on Nexlizet per Dr. Rennis Golden. -We discussed the severe complications of Paget disease including bone cancer, fractures, heart failure are very rare nowadays especially in the case as mild as hers -She wanted to avoid bisphosphonates due to previous intolerance but she is now on Prolia, taken consistently  2.  Clinical osteoporosis -In 2020, bone density scan showed slightly worse bone density scores at the femoral necks-however, this did not reach statistical significance.  However, she had another bone density scan in 12/2020 and this showed significant improvement of BMD at the level of the spine and also improvement at the hips, but this did not reach statistical significance. -Her FRAX score was high, confirming clinical osteoporosis, for which treatment was indicated -She started Prolia in 07/2019. She tolerates this well, without jaw/thigh/hip pain -She was on 1000 units vitamin D daily but taken off during investigation for liver disease >> restarted earlier in the year -Latest vitamin D level was normal 04/2023.  3. Postablative hypothyroidism - latest thyroid labs reviewed with pt. >> normal: Lab Results  Component Value Date   TSH 4.28 06/02/2023  - she continues on LT4 50 mcg daily - pt feels good on this dose. - we discussed about taking the thyroid hormone every day, with water, >30 minutes before breakfast, separated by >4 hours from acid reflux medications, calcium, iron, multivitamins. Pt. is taking it correctly.  4.  Prediabetes -HbA1c levels fluctuate between the normal range and prediabetic range -Highest HbA1c was 6.0%, however at last visit this was 5.9% -She continues to adjust her diet by cutting out fast foods, concentrated sweets, and starches. -Investigation for type 1 diabetes returned  negative -For now, no pharmaceutical intervention is needed.  Continue diet and exercise.  Carlus Pavlov, MD PhD [facility] Endocrinology

## 2023-07-28 NOTE — Patient Instructions (Signed)
Please continue: - Vitamin D 1000 units daily - Synthroid 50 mcg daily.  Take the thyroid hormone every day, with water, at least 30 minutes before breakfast, separated by at least 4 hours from: - acid reflux medications - calcium - iron - multivitamins  Please stop at the lab.  Please come back for a follow-up appointment in 1 year.

## 2023-08-03 DIAGNOSIS — E785 Hyperlipidemia, unspecified: Secondary | ICD-10-CM | POA: Diagnosis not present

## 2023-08-04 LAB — NMR, LIPOPROFILE
Cholesterol, Total: 194 mg/dL (ref 100–199)
HDL Particle Number: 43.1 umol/L (ref >=30.5)
HDL-C: 104 mg/dL (ref >39)
LDL Particle Number: 519 nmol/L (ref <1000)
LDL Size: 21 nmol (ref >20.5)
LDL-C (NIH Calc): 81 mg/dL (ref 0–99)
LP-IR Score: <25 (ref <=45)
Small LDL Particle Number: <90 nmol/L (ref <=527)
Triglycerides: 44 mg/dL (ref 0–149)

## 2023-08-06 ENCOUNTER — Other Ambulatory Visit: Payer: Self-pay | Admitting: Internal Medicine

## 2023-08-06 ENCOUNTER — Other Ambulatory Visit (INDEPENDENT_AMBULATORY_CARE_PROVIDER_SITE_OTHER): Payer: Medicare Other

## 2023-08-06 DIAGNOSIS — M810 Age-related osteoporosis without current pathological fracture: Secondary | ICD-10-CM

## 2023-08-06 LAB — VITAMIN D 25 HYDROXY (VIT D DEFICIENCY, FRACTURES): VITD: 51.49 ng/mL (ref 30.00–100.00)

## 2023-08-17 ENCOUNTER — Encounter: Payer: Self-pay | Admitting: Internal Medicine

## 2023-08-17 ENCOUNTER — Ambulatory Visit: Payer: Medicare Other | Attending: Internal Medicine | Admitting: Internal Medicine

## 2023-08-17 VITALS — BP 122/68 | HR 75 | Ht 63.0 in | Wt 94.2 lb

## 2023-08-17 DIAGNOSIS — R001 Bradycardia, unspecified: Secondary | ICD-10-CM | POA: Diagnosis not present

## 2023-08-17 DIAGNOSIS — E785 Hyperlipidemia, unspecified: Secondary | ICD-10-CM | POA: Diagnosis not present

## 2023-08-17 DIAGNOSIS — T466X5D Adverse effect of antihyperlipidemic and antiarteriosclerotic drugs, subsequent encounter: Secondary | ICD-10-CM

## 2023-08-17 DIAGNOSIS — K719 Toxic liver disease, unspecified: Secondary | ICD-10-CM

## 2023-08-17 DIAGNOSIS — T466X5A Adverse effect of antihyperlipidemic and antiarteriosclerotic drugs, initial encounter: Secondary | ICD-10-CM | POA: Insufficient documentation

## 2023-08-17 LAB — COMPREHENSIVE METABOLIC PANEL
ALT: 16 [IU]/L (ref 0–32)
AST: 29 [IU]/L (ref 0–40)
Albumin: 4.5 g/dL (ref 3.8–4.8)
Alkaline Phosphatase: 58 [IU]/L (ref 44–121)
BUN/Creatinine Ratio: 26 (ref 12–28)
BUN: 29 mg/dL — ABNORMAL HIGH (ref 8–27)
Bilirubin Total: 0.3 mg/dL (ref 0.0–1.2)
CO2: 27 mmol/L (ref 20–29)
Calcium: 10.3 mg/dL (ref 8.7–10.3)
Chloride: 104 mmol/L (ref 96–106)
Creatinine, Ser: 1.12 mg/dL — ABNORMAL HIGH (ref 0.57–1.00)
Globulin, Total: 1.9 g/dL (ref 1.5–4.5)
Glucose: 91 mg/dL (ref 70–99)
Potassium: 4.8 mmol/L (ref 3.5–5.2)
Sodium: 142 mmol/L (ref 134–144)
Total Protein: 6.4 g/dL (ref 6.0–8.5)
eGFR: 51 mL/min/{1.73_m2} — ABNORMAL LOW (ref >59)

## 2023-08-17 NOTE — Patient Instructions (Signed)
Medication Instructions:  No changes.  *If you need a refill on your cardiac medications before your next appointment, please call your pharmacy*   Lab Work: BMET today. NMR in one year.  FASTING. If you have labs (blood work) drawn today and your tests are completely normal, you will receive your results only by: MyChart Message (if you have MyChart) OR A paper copy in the mail If you have any lab test that is abnormal or we need to change your treatment, we will call you to review the results.    Follow-Up: At [facility], you and your health needs are our priority.  As part of our continuing mission to provide you with exceptional heart care, we have created designated Provider Care Teams.  These Care Teams include your primary Cardiologist (physician) and Advanced Practice Providers (APPs -  Physician Assistants and Nurse Practitioners) who all work together to provide you with the care you need, when you need it.  We recommend signing up for the patient portal called "MyChart".  Sign up information is provided on this After Visit Summary.  MyChart is used to connect with patients for Virtual Visits (Telemedicine).  Patients are able to view lab/test results, encounter notes, upcoming appointments, etc.  Non-urgent messages can be sent to your provider as well.   To learn more about what you can do with MyChart, go to [email or web address].    Your next appointment:   12 month(s)  Provider:   Zoila Shutter MD

## 2023-08-17 NOTE — Progress Notes (Signed)
LIPID CLINIC CONSULT NOTE  Chief Complaint:  Follow-up  Primary Care Physician: Sherlene Shams, MD  Primary Cardiologist:  None  HPI:  Hailey MALEKI is a 77 y.o. female who is being seen today for the evaluation of dyslipidemia at the request of Darrick Huntsman, Mar Daring, MD. is a pleasant 77 year old female kindly referred for evaluation and management of dyslipidemia.  She has a history of statin intolerance having been placed on atorvastatin in the past by her FMD specialist and having had significant elevation in her liver enzymes in the upper 100s to 200 range.  Fortunately, based on recent labs this has resolved.  She apparently had seen a hepatologist and was not noted to have any underlying hepatic pathology to explain this although the statins themselves should not cause this without some underlying abnormality such as steatohepatitis.  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  She has been maintained on ezetimibe which she seems to tolerate well.  This is not worsened her liver enzymes.  Her recent lipid profile though showed total cholesterol 191, triglycerides 94, HDL 59 and LDL 91.  Her target LDL is less than 70 as she underwent CT angiography of the head and neck noting that there was severe atherosclerosis of the neck vessels in addition to findings consistent with fibromuscular dysplasia, this study being performed at [facility] where she sees her specialist.  She also tells me she has a history of a clotting disorder and was advised not to be on aspirin although also advised to be on aspirin given the fact that she has severe atherosclerotic vascular disease.  08/17/2023  Ms. Kooi is seen today in follow-up.  She just had her 77th birthday.  Overall she says she is feeling well.  She is tolerating the combination therapy medication Nexlizet without any significant issues.  She has had a marked improvement in her lipids  on this.  Her LDL particle number is down to 519 (from 1127), LDL 81, HDL 104, triglycerides 44 and small LDL particle is less than 90.  Overall a low risk lipid profile.  PMHx:  Past Medical History:  Diagnosis Date   Aberrant right subclavian artery    Cataract    surgery   Clotting disorder (HCC)    hx of bleeding after vaginal delivery, dr told her not to have any surgeries that are necessary   Dysplasia, artery, fibromuscular, renal (HCC)    Fibromuscular dysplasia (HCC)    Fibromuscular dysplasia of renal artery (HCC)    managed by Dr. Melvern [facility]   Graves disease    Heart murmur    Heart murmur    Hypertension    Kidney stone    Migraines    Neuropathy with IgA monoclonal gammopathy (HCC)    Dr Ellis Savage   Paget's disease of the bone    Pernicious anemia    Thyroid disease    Urinary incontinence, mixed    resolved with removal of ained sutures in bladder     Past Surgical History:  Procedure Laterality Date   BILATERAL SALPINGOOPHORECTOMY     CATARACT EXTRACTION  Sep 10, 2011, Jan 13,2013   COLONOSCOPY     RENAL BIOPSY     TONSILLECTOMY     VAGINAL DELIVERY     x2   VAGINAL HYSTERECTOMY     WISDOM TOOTH EXTRACTION      FAMHx:  Family History  Problem Relation  Age of Onset   Anuerysm Mother    COPD Mother    Hypertension Mother    Heart disease Mother    Heart failure Mother    Cancer Father        lung CA   Heart disease Father    Heart attack Father    Heart disease Brother        viral cardiomyopathy, cocaine use   Drug abuse Brother    Cancer Maternal Aunt        esophageal and live rCA,  hisotory of  ETOH and tobacco   Anuerysm Maternal Grandfather    Cancer Paternal Grandmother        brain tumor   Diabetes Sister    Colon cancer Paternal Uncle     SOCHx:   reports that she has never smoked. She has never used smokeless tobacco. She reports current alcohol use. She reports that she does not use drugs.  ALLERGIES:  Allergies   Allergen Reactions   Voltaren [Diclofenac] Hypertension   Adhesive [Tape]    Atorvastatin Other (See Comments)   Demerol     HYPOTENSION   Macrodantin    Nsaids Hypertension   Voltaren [Diclofenac Sodium] Other (See Comments)    FMD, increased bp   Codeine Nausea And Vomiting and Rash   Nickel Rash   Penicillins Rash   Sulfa Antibiotics Rash    Fever    ROS: Pertinent items noted in HPI and remainder of comprehensive ROS otherwise negative.  HOME MEDS: Current Outpatient Medications on File Prior to Visit  Medication Sig Dispense Refill   amLODipine (NORVASC) 2.5 MG tablet Take 1 tablet (2.5 mg total) by mouth daily. Taking 1/2 tablet daily 45 tablet 1   ASPIRIN 81 PO      Bempedoic Acid-Ezetimibe (NEXLIZET) 180-10 MG TABS Take 1 tablet by mouth daily. 90 tablet 3   cholecalciferol (VITAMIN D) 1000 UNITS tablet Take 1,000 Units by mouth daily.     denosumab (PROLIA) 60 MG/ML SOSY injection      mometasone (ELOCON) 0.1 % cream Apply 2 times daily to both ears 15 g 1   promethazine-dextromethorphan (PROMETHAZINE-DM) 6.25-15 MG/5ML syrup Take 5 mLs by mouth 4 (four) times daily as needed for cough. 180 mL 1   SYNTHROID 50 MCG tablet TAKE ONE TABLET BY MOUTH DAILY ON AN EMPTY STOMACH WITH A FULL GLASS OF WATER AT LEAST 30-60 MINUTES BEFORE BREAKFAST 90 tablet 1   traMADol (ULTRAM) 50 MG tablet Take 1 tablet (50 mg total) by mouth every 8 (eight) hours as needed. 30 tablet 3   tretinoin (RETIN-A) 0.025 % cream APPLY TOPICALLY EVERY NIGHT AT BEDTIME 45 g 1   Multiple Vitamins-Minerals (CENTRUM SILVER 50+WOMEN PO) Take by mouth. (Patient not taking: Reported on 08/17/2023)     No current facility-administered medications on file prior to visit.    LABS/IMAGING: No results found for this or any previous visit (from the past 48 hour(s)). No results found.  LIPID PANEL:    Component Value Date/Time   CHOL 164 05/25/2023 0734   TRIG 85.0 05/25/2023 0734   HDL 81.10 05/25/2023  0734   CHOLHDL 2 05/25/2023 0734   VLDL 17.0 05/25/2023 0734   LDLCALC 66 05/25/2023 0734   LDLCALC 43 06/04/2022 0902   LDLDIRECT 92.0 11/19/2015 1633    WEIGHTS: Wt Readings from Last 3 Encounters:  08/17/23 94 lb 3.2 oz (42.7 kg)  07/28/23 93 lb 12.8 oz (42.5 kg)  06/03/23 90 lb 6  oz (41 kg)    VITALS: BP 122/68 (BP Location: Left Arm, Patient Position: Sitting, Cuff Size: Normal)   Pulse 75   Ht 5\' 3"  (1.6 m)   Wt 94 lb 3.2 oz (42.7 kg)   SpO2 96%   BMI 16.69 kg/m   EXAM: Deferred  EKG: Deferred  ASSESSMENT: Mixed dyslipidemia, goal LDL less than 70 Statin intolerant-elevated liver enzymes Fibromuscular dysplasia Bleeding diathesis PAD  PLAN: 1.   Ms. Kleen has done well on combination therapy with Nexlizet.  Her lipids are well-controlled.  Although LDL is still above target her particle numbers indicate very good control.  This may be due to some dietary cholesterol intake.  She was concerned about previous elevation in her liver enzymes which are transient.  This was not reassessed with her recent labs.  Will go ahead and get a comprehensive metabolic profile.  Otherwise we will plan follow-up annually with a repeat NMR or sooner if necessary.  Chrystie Nose, MD, [facility], FACP  [facility]  Medical Director of the [facility] Lipid Disorders &  Cardiovascular Risk Reduction Clinic Diplomate of the American Board of Clinical Lipidology Attending Cardiologist  Direct Dial: [phone]  Fax: [phone]  Website:  [email or web address] 08/17/2023, 9:37 AM

## 2023-09-01 ENCOUNTER — Other Ambulatory Visit: Payer: Self-pay | Admitting: Internal Medicine

## 2023-09-03 DIAGNOSIS — I773 Arterial fibromuscular dysplasia: Secondary | ICD-10-CM | POA: Diagnosis not present

## 2023-09-03 DIAGNOSIS — R3129 Other microscopic hematuria: Secondary | ICD-10-CM | POA: Diagnosis not present

## 2023-09-10 DIAGNOSIS — N1831 Chronic kidney disease, stage 3a: Secondary | ICD-10-CM | POA: Diagnosis not present

## 2023-09-10 DIAGNOSIS — I773 Arterial fibromuscular dysplasia: Secondary | ICD-10-CM | POA: Diagnosis not present

## 2023-09-10 DIAGNOSIS — I10 Essential (primary) hypertension: Secondary | ICD-10-CM | POA: Diagnosis not present

## 2023-09-30 NOTE — Telephone Encounter (Signed)
 Prolia VOB initiated via [email or web address]  Next Prolia inj DUE: 11/02/23

## 2023-10-08 DIAGNOSIS — N1831 Chronic kidney disease, stage 3a: Secondary | ICD-10-CM | POA: Diagnosis not present

## 2023-10-08 DIAGNOSIS — I773 Arterial fibromuscular dysplasia: Secondary | ICD-10-CM | POA: Diagnosis not present

## 2023-10-08 DIAGNOSIS — R3129 Other microscopic hematuria: Secondary | ICD-10-CM | POA: Diagnosis not present

## 2023-10-08 DIAGNOSIS — I10 Essential (primary) hypertension: Secondary | ICD-10-CM | POA: Diagnosis not present

## 2023-10-11 NOTE — Telephone Encounter (Signed)
 Patient is ready for scheduling on or after 11/02/23 BUY AND BILL  Out-of-pocket cost due at time of visit: $0  Primary: Medicare Prolia  co-insurance: 20% (approximately $323.52) Admin fee co-insurance: 20% (approximately $25)  Deductible: $0 of $257 met  Prior Auth: NOT required  Secondary: BCBS [place] Medicare Supplement Plan F Prolia  co-insurance: Covers Medicare Part B co-insurance Admin fee co-insurance: Covers Medicare Part B co-insurance  Deductible:  Covered by secondary  Prior Auth: NOT required PA# Valid:   ** This summary of benefits is an estimation of the patient's out-of-pocket cost. Exact cost may vary based on individual plan coverage.

## 2023-10-11 NOTE — Telephone Encounter (Signed)
 Medical Buy and Bill  Prior Authorization NOT required for [employer] - covered benefit   Primary: Medicare     Secondary: BCBS [place] Medicare Supplement Plan F

## 2023-10-28 ENCOUNTER — Other Ambulatory Visit: Payer: Self-pay | Admitting: Internal Medicine

## 2023-10-28 ENCOUNTER — Other Ambulatory Visit (INDEPENDENT_AMBULATORY_CARE_PROVIDER_SITE_OTHER): Payer: Medicare Other

## 2023-10-28 DIAGNOSIS — I773 Arterial fibromuscular dysplasia: Secondary | ICD-10-CM

## 2023-10-28 LAB — BASIC METABOLIC PANEL
BUN: 29 mg/dL — ABNORMAL HIGH (ref 6–23)
CO2: 30 meq/L (ref 19–32)
Calcium: 9.8 mg/dL (ref 8.4–10.5)
Chloride: 101 meq/L (ref 96–112)
Creatinine, Ser: 1.19 mg/dL (ref 0.40–1.20)
GFR: 44.82 mL/min — ABNORMAL LOW (ref >60.00)
Glucose, Bld: 91 mg/dL (ref 70–99)
Potassium: 4.6 meq/L (ref 3.5–5.1)
Sodium: 139 meq/L (ref 135–145)

## 2023-10-28 NOTE — Assessment & Plan Note (Addendum)
Amldoipine stopped and olmesartan started by [facility] nephrology at 2.5 mg  daily in early Jan.  Readings have been < 120 systolic , none below 100.  Diastolics in the low 60's.

## 2023-10-29 DIAGNOSIS — L821 Other seborrheic keratosis: Secondary | ICD-10-CM | POA: Diagnosis not present

## 2023-10-29 DIAGNOSIS — L814 Other melanin hyperpigmentation: Secondary | ICD-10-CM | POA: Diagnosis not present

## 2023-10-29 DIAGNOSIS — D225 Melanocytic nevi of trunk: Secondary | ICD-10-CM | POA: Diagnosis not present

## 2023-10-29 DIAGNOSIS — L700 Acne vulgaris: Secondary | ICD-10-CM | POA: Diagnosis not present

## 2023-10-31 ENCOUNTER — Encounter: Payer: Self-pay | Admitting: Internal Medicine

## 2023-11-03 ENCOUNTER — Ambulatory Visit: Payer: Medicare Other

## 2023-11-03 DIAGNOSIS — M810 Age-related osteoporosis without current pathological fracture: Secondary | ICD-10-CM | POA: Diagnosis not present

## 2023-11-03 MED ORDER — DENOSUMAB 60 MG/ML ~~LOC~~ SOSY
60.0000 mg | PREFILLED_SYRINGE | Freq: Once | SUBCUTANEOUS | Status: AC
  Administered 2023-11-03: 60 mg via SUBCUTANEOUS

## 2023-11-03 MED ORDER — DENOSUMAB 60 MG/ML ~~LOC~~ SOSY
60.0000 mg | PREFILLED_SYRINGE | Freq: Once | SUBCUTANEOUS | Status: AC
  Administered 2024-05-06: 60 mg via SUBCUTANEOUS

## 2023-11-03 NOTE — Progress Notes (Signed)
 After obtaining consent, and per orders of Dr. Elvera Lennox, injection of Prolia 60 mg given by Pollie Meyer. Patient instructed to remain in clinic for 20 minutes afterwards, and to report any adverse reaction to me immediately.

## 2023-11-03 NOTE — Telephone Encounter (Signed)
Last Prolia inj 11/03/23 Next Prolia inj due 05/03/24

## 2023-11-07 ENCOUNTER — Other Ambulatory Visit: Payer: Self-pay | Admitting: Internal Medicine

## 2023-11-25 DIAGNOSIS — I10 Essential (primary) hypertension: Secondary | ICD-10-CM | POA: Diagnosis not present

## 2023-11-25 DIAGNOSIS — I773 Arterial fibromuscular dysplasia: Secondary | ICD-10-CM | POA: Diagnosis not present

## 2023-11-26 DIAGNOSIS — E875 Hyperkalemia: Secondary | ICD-10-CM | POA: Diagnosis not present

## 2024-02-01 DIAGNOSIS — I10 Essential (primary) hypertension: Secondary | ICD-10-CM | POA: Diagnosis not present

## 2024-02-01 DIAGNOSIS — N1831 Chronic kidney disease, stage 3a: Secondary | ICD-10-CM | POA: Diagnosis not present

## 2024-02-01 DIAGNOSIS — E875 Hyperkalemia: Secondary | ICD-10-CM | POA: Diagnosis not present

## 2024-02-11 DIAGNOSIS — E875 Hyperkalemia: Secondary | ICD-10-CM | POA: Diagnosis not present

## 2024-02-11 DIAGNOSIS — R3129 Other microscopic hematuria: Secondary | ICD-10-CM | POA: Diagnosis not present

## 2024-02-11 DIAGNOSIS — I10 Essential (primary) hypertension: Secondary | ICD-10-CM | POA: Diagnosis not present

## 2024-02-11 DIAGNOSIS — I773 Arterial fibromuscular dysplasia: Secondary | ICD-10-CM | POA: Diagnosis not present

## 2024-02-11 DIAGNOSIS — N1831 Chronic kidney disease, stage 3a: Secondary | ICD-10-CM | POA: Diagnosis not present

## 2024-03-14 DIAGNOSIS — I10 Essential (primary) hypertension: Secondary | ICD-10-CM | POA: Diagnosis not present

## 2024-03-14 DIAGNOSIS — R3129 Other microscopic hematuria: Secondary | ICD-10-CM | POA: Diagnosis not present

## 2024-03-14 DIAGNOSIS — I773 Arterial fibromuscular dysplasia: Secondary | ICD-10-CM | POA: Diagnosis not present

## 2024-03-14 DIAGNOSIS — E875 Hyperkalemia: Secondary | ICD-10-CM | POA: Diagnosis not present

## 2024-03-14 DIAGNOSIS — N1831 Chronic kidney disease, stage 3a: Secondary | ICD-10-CM | POA: Diagnosis not present

## 2024-04-04 ENCOUNTER — Other Ambulatory Visit: Payer: Self-pay | Admitting: Internal Medicine

## 2024-04-04 DIAGNOSIS — Z1231 Encounter for screening mammogram for malignant neoplasm of breast: Secondary | ICD-10-CM | POA: Diagnosis not present

## 2024-04-04 LAB — HM MAMMOGRAPHY

## 2024-04-05 NOTE — Telephone Encounter (Signed)
 Prolia  VOB initiated via [email or web address]  Next Prolia  inj DUE: 05/03/24

## 2024-04-07 NOTE — Telephone Encounter (Signed)
 Medical Buy and Annette Stable - Prior Authorization NOT required for [employer]

## 2024-04-19 NOTE — Telephone Encounter (Signed)
 Medical Buy and [place]  Patient is ready for scheduling on or after 05/03/24  Out-of-pocket cost due at time of visit: $0  Primary: [place]  Medicare Prolia  co-insurance: 20% (approximately $331.87) Admin fee co-insurance: 20% (approximately $25)  Deductible: $257 of $257 met  Prior Auth: NOT required  Secondary: BCBS [place] Medicare Supp Plan F Prolia  co-insurance: Covers Medicare Part B co-insurance Admin fee co-insurance: Covers Medicare Part B co-insurance  Deductible:  Covered by secondary  Prior Auth: NOT required PA# Valid:   ** This summary of benefits is an estimation of the patient's out-of-pocket cost. Exact cost may vary based on individual plan coverage.

## 2024-04-28 ENCOUNTER — Telehealth: Payer: Self-pay | Admitting: Pharmacy Technician

## 2024-04-28 NOTE — Telephone Encounter (Signed)
   Received cmm renewal but doesn't need pa

## 2024-05-04 ENCOUNTER — Ambulatory Visit

## 2024-05-06 ENCOUNTER — Ambulatory Visit

## 2024-05-06 DIAGNOSIS — M810 Age-related osteoporosis without current pathological fracture: Secondary | ICD-10-CM | POA: Diagnosis not present

## 2024-05-06 MED ORDER — DENOSUMAB 60 MG/ML ~~LOC~~ SOSY
60.0000 mg | PREFILLED_SYRINGE | Freq: Once | SUBCUTANEOUS | Status: AC
Start: 2024-11-06 — End: ?

## 2024-05-06 NOTE — Progress Notes (Signed)
 Medication: Prolia  Dosage:60 mg Location: Right Arm Injection given ab:[id number] Provider: Lela Trixie COME

## 2024-05-07 ENCOUNTER — Other Ambulatory Visit: Payer: Self-pay | Admitting: Internal Medicine

## 2024-05-08 NOTE — Telephone Encounter (Signed)
 Last Prolia  inj 05/06/24 Next Prolia  inj due 11/07/24

## 2024-05-19 DIAGNOSIS — H40013 Open angle with borderline findings, low risk, bilateral: Secondary | ICD-10-CM | POA: Diagnosis not present

## 2024-05-31 ENCOUNTER — Telehealth: Payer: Self-pay | Admitting: Pharmacy Technician

## 2024-05-31 NOTE — Telephone Encounter (Signed)
 Pharmacy Patient Advocate Encounter  Received notification from [facility] that Prior Authorization for nexlizet  has been APPROVED from 05/31/24 to 05/31/25   PA #/Case ID/Reference #: [id number]

## 2024-05-31 NOTE — Telephone Encounter (Signed)
   Pharmacy Patient Advocate Encounter   Received notification from Onbase that prior authorization for NEXLIZET  is required/requested.   Insurance verification completed.   The patient is insured through [facility] .   Per test claim: PA required; PA submitted to above mentioned insurance via Latent Key/confirmation #/EOC [id number] Status is pending

## 2024-06-02 ENCOUNTER — Encounter (HOSPITAL_BASED_OUTPATIENT_CLINIC_OR_DEPARTMENT_OTHER): Payer: Self-pay

## 2024-06-06 ENCOUNTER — Ambulatory Visit (INDEPENDENT_AMBULATORY_CARE_PROVIDER_SITE_OTHER): Payer: Medicare Other | Admitting: *Deleted

## 2024-06-06 VITALS — Ht 63.0 in | Wt 92.2 lb

## 2024-06-06 DIAGNOSIS — Z0000 Encounter for general adult medical examination without abnormal findings: Secondary | ICD-10-CM

## 2024-06-06 NOTE — Progress Notes (Signed)
 Subjective:   Hailey Hunt is a 78 y.o. who presents for a Medicare Wellness preventive visit.  As a reminder, Annual Wellness Visits don't include a physical exam, and some assessments may be limited, especially if this visit is performed virtually. We may recommend an in-person follow-up visit with your provider if needed.  Visit Complete: Virtual I connected with  Dawnelle B Linhart on 06/06/24 by a audio enabled telemedicine application and verified that I am speaking with the correct person using two identifiers.  Patient Location: Home  Provider Location: Home Office  I discussed the limitations of evaluation and management by telemedicine. The patient expressed understanding and agreed to proceed.  Vital Signs: Because this visit was a virtual/telehealth visit, some criteria may be missing or patient reported. Any vitals not documented were not able to be obtained and vitals that have been documented are patient reported.  VideoDeclined- This patient declined Librarian, academic. Therefore the visit was completed with audio only.  Persons Participating in Visit: Patient.  AWV Questionnaire: Yes: Patient Medicare AWV questionnaire was completed by the patient on 06/02/24; I have confirmed that all information answered by patient is correct and no changes since this date.  Cardiac Risk Factors include: advanced age (>79men, >67 women);dyslipidemia;hypertension     Objective:    Today's Vitals   06/06/24 0854  Weight: 92 lb 4 oz (41.8 kg)  Height: 5' 3 (1.6 m)   Body mass index is 16.34 kg/m.     06/06/2024    9:14 AM 06/03/2023    9:24 AM 05/20/2022    3:25 PM 05/15/2021    9:15 AM 05/14/2020    9:10 AM 05/12/2019    9:15 AM 05/10/2018    9:41 AM  Advanced Directives  Does Patient Have a Medical Advance Directive? Yes Yes Yes Yes Yes Yes Yes   Type of Estate agent of [place];Living will Healthcare Power of [place];Living  will Healthcare Power of [place];Living will Healthcare Power of [place];Living will Healthcare Power of [place];Living will Healthcare Power of [place];Living will Healthcare Power of [place];Living will  Does patient want to make changes to medical advance directive?   No - Patient declined No - Patient declined No - Patient declined No - Patient declined  No - Patient declined   Copy of Healthcare Power of Attorney in Chart? No - copy requested No - copy requested No - copy requested No - copy requested No - copy requested No - copy requested  No - copy requested      Data saved with a previous flowsheet row definition    Current Medications (verified) Outpatient Encounter Medications as of 06/06/2024  Medication Sig   ASPIRIN 81 PO    Bempedoic Acid-Ezetimibe  (NEXLIZET ) 180-10 MG TABS Take 1 tablet by mouth daily.   cholecalciferol (VITAMIN D ) 1000 UNITS tablet Take 1,000 Units by mouth daily.   denosumab  (PROLIA ) 60 MG/ML SOSY injection    mometasone  (ELOCON ) 0.1 % cream APPLY TWO TIMES A DAY TO BOTH EARS   promethazine -dextromethorphan (PROMETHAZINE -DM) 6.25-15 MG/5ML syrup Take 5 mLs by mouth 4 (four) times daily as needed for cough.   SYNTHROID  50 MCG tablet TAKE 1 TABLET BY MOUTH EVERY MORNING ON AN EMPTY STOMACH WITH A FULL GLASS OF WATER AT LEAST 30-60 MINUTES BEFORE BREAKFAST   traMADol  (ULTRAM ) 50 MG tablet Take 1 tablet (50 mg total) by mouth every 8 (eight) hours as needed.   tretinoin  (RETIN-A ) 0.025 % cream APPLY TOPICALLY EVERY  NIGHT AT BEDTIME   [DISCONTINUED] Multiple Vitamins-Minerals (CENTRUM SILVER 50+WOMEN PO) Take by mouth. (Patient not taking: Reported on 08/17/2023)   Facility-Administered Encounter Medications as of 06/06/2024  Medication   [START ON 11/06/2024] denosumab  (PROLIA ) injection 60 mg    Allergies (verified) Voltaren [diclofenac], Adhesive [tape], Atorvastatin, Demerol, Macrodantin, Nsaids, Voltaren [diclofenac sodium], Codeine, Nickel, Penicillins,  and Sulfa antibiotics   History: Past Medical History:  Diagnosis Date   Aberrant right subclavian artery    Cataract    surgery   Clotting disorder (HCC)    hx of bleeding after vaginal delivery, dr told her not to have any surgeries that are necessary   Dysplasia, artery, fibromuscular, renal (HCC)    Fibromuscular dysplasia (HCC)    Fibromuscular dysplasia of renal artery (HCC)    managed by Dr. Gussie [facility]   Graves disease    Heart murmur    Heart murmur    Hypertension    Kidney stone    Migraines    Neuropathy with IgA monoclonal gammopathy (HCC)    Dr Lajuana   Paget's disease of the bone    Pernicious anemia    Thyroid  disease    Urinary incontinence, mixed    resolved with removal of ained sutures in bladder    Past Surgical History:  Procedure Laterality Date   BILATERAL SALPINGOOPHORECTOMY     CATARACT EXTRACTION  Sep 10, 2011, Jan 13,2013   COLONOSCOPY     RENAL BIOPSY     TONSILLECTOMY     VAGINAL DELIVERY     x2   VAGINAL HYSTERECTOMY     WISDOM TOOTH EXTRACTION     Family History  Problem Relation Age of Onset   Anuerysm Mother    COPD Mother    Hypertension Mother    Heart disease Mother    Heart failure Mother    Cancer Father        lung CA   Heart disease Father    Heart attack Father    Heart disease Brother        viral cardiomyopathy, cocaine use   Drug abuse Brother    Cancer Maternal Aunt        esophageal and live rCA,  hisotory of  ETOH and tobacco   Anuerysm Maternal Grandfather    Cancer Paternal Grandmother        brain tumor   Diabetes Sister    Colon cancer Paternal Uncle    Social History   Socioeconomic History   Marital status: Married    Spouse name: Not on file   Number of children: 2   Years of education: Not on file   Highest education level: Associate degree: occupational, Scientist, product/process development, or vocational program  Occupational History   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  Tobacco Use   Smoking status: Never    Smokeless tobacco: Never  Vaping Use   Vaping status: Never Used  Substance and Sexual Activity   Alcohol use: Yes    Comment: once a year   Drug use: No   Sexual activity: Not Currently  Other Topics Concern   Not on file  Social History Narrative   Married   Social Drivers of Health   Financial Resource Strain: Low Risk  (06/02/2024)   Overall Financial Resource Strain (CARDIA)    Difficulty of Paying Living Expenses: Not hard at all  Food Insecurity: No Food Insecurity (06/02/2024)   Hunger Vital Sign    Worried About Programme researcher, broadcasting/film/video in  the Last Year: Never true    Ran Out of Food in the Last Year: Never true  Transportation Needs: No Transportation Needs (06/02/2024)   PRAPARE - Administrator, Civil Service (Medical): No    Lack of Transportation (Non-Medical): No  Physical Activity: Sufficiently Active (06/02/2024)   Exercise Vital Sign    Days of Exercise per Week: 7 days    Minutes of Exercise per Session: 60 min  Stress: Stress Concern Present (06/02/2024)   [employer] of Occupational Health - Occupational Stress Questionnaire    Feeling of Stress: To some extent  Social Connections: Moderately Integrated (06/02/2024)   Social Connection and Isolation Panel    Frequency of Communication with Friends and Family: More than three times a week    Frequency of Social Gatherings with Friends and Family: Once a week    Attends Religious Services: Never    Database administrator or Organizations: Yes    Attends Banker Meetings: Never    Marital Status: Married    Tobacco Counseling Counseling given: Not Answered    Clinical Intake:  Pre-visit preparation completed: Yes  Pain : No/denies pain     BMI - recorded: 16.34 Nutritional Status: BMI <19  Underweight Nutritional Risks: None Diabetes: No  Lab Results  Component Value Date   HGBA1C 5.5 03/05/2023   HGBA1C 6.1 12/01/2022   HGBA1C 5.9 07/24/2022     How often do you  need to have someone help you when you read instructions, pamphlets, or other written materials from your doctor or pharmacy?: 1 - Never  Interpreter Needed?: No  Information entered by :: R. Jaimee Corum LPN   Activities of Daily Living     06/02/2024    9:13 AM  In your present state of health, do you have any difficulty performing the following activities:  Hearing? 1  Comment a little difficulty  Vision? 0  Difficulty concentrating or making decisions? 0  Walking or climbing stairs? 0  Dressing or bathing? 0  Doing errands, shopping? 0  Preparing Food and eating ? N  Using the Toilet? N  In the past six months, have you accidently leaked urine? N  Do you have problems with loss of bowel control? N  Managing your Medications? N  Managing your Finances? N  Housekeeping or managing your Housekeeping? N    Patient Care Team: [name], MD as PCP - General (Internal Medicine) Lajuana Rollene LABOR, MD (Nephrology) Perla Evalene PARAS, MD as Consulting Physician (Cardiology)  I have updated your Care Teams any recent Medical Services you may have received from other providers in the past year.     Assessment:   This is a routine wellness examination for Eren.  Hearing/Vision screen Hearing Screening - Comments:: Hearing aids cause ears to itch Vision Screening - Comments:: glasses   Goals Addressed             This Visit's Progress    Patient Stated       Wants to continue to get her cholesterol down       Depression Screen     06/06/2024    9:04 AM 06/03/2023    9:17 AM 12/01/2022    8:04 AM 11/14/2022    9:02 AM 10/30/2022    8:01 AM 06/04/2022    8:17 AM 05/20/2022    3:49 PM  PHQ 2/9 Scores  PHQ - 2 Score 0 0 0 0 0 0 0  PHQ- 9  Score 3 0         Fall Risk     06/02/2024    9:13 AM 05/31/2023    2:35 PM 03/03/2023    1:36 PM 12/01/2022    8:04 AM 11/14/2022    9:02 AM  Fall Risk   Falls in the past year? 1 0 0 0 0  Number falls in past yr: 0 0 0 0 0  Injury with  Fall? 0 0 0 0 0  Risk for fall due to : History of fall(s);Impaired balance/gait  No Fall Risks No Fall Risks No Fall Risks  Follow up Falls evaluation completed;Falls prevention discussed Falls evaluation completed;Falls prevention discussed Falls evaluation completed Falls evaluation completed Falls evaluation completed    MEDICARE RISK AT HOME:  Medicare Risk at Home Any stairs in or around the home?: (Patient-Rptd) Yes If so, are there any without handrails?: No Home free of loose throw rugs in walkways, pet beds, electrical cords, etc?: (Patient-Rptd) Yes Adequate lighting in your home to reduce risk of falls?: (Patient-Rptd) Yes Life alert?: (Patient-Rptd) No Use of a cane, walker or w/c?: (Patient-Rptd) No Grab bars in the bathroom?: (Patient-Rptd) Yes Shower chair or bench in shower?: (Patient-Rptd) No Elevated toilet seat or a handicapped toilet?: (Patient-Rptd) No  TIMED UP AND GO:  Was the test performed?  No  Cognitive Function: 6CIT completed    05/14/2020    9:21 AM 05/10/2018    9:48 AM 12/17/2016    3:30 PM  MMSE - Mini Mental State Exam  Not completed: Unable to complete    Orientation to time  5 5   Orientation to Place  5 5   Registration  3 3   Attention/ Calculation  5 5   Recall  3 3   Language- name 2 objects  2 2   Language- repeat  1 1  Language- follow 3 step command  3 3   Language- read & follow direction  1 1   Write a sentence  1 1   Copy design  1 1   Total score  30 30      Data saved with a previous flowsheet row definition        06/06/2024    9:15 AM 06/03/2023    9:25 AM 05/20/2022    4:19 PM 05/12/2019    9:14 AM  6CIT Screen  What Year?  0 points  0 points  What month? 0 points 0 points  0 points  What time? 0 points 0 points  0 points  Count back from 20 0 points 0 points  0 points  Months in reverse 0 points 0 points 0 points 0 points  Repeat phrase 0 points 0 points  0 points  Total Score  0 points  0 points     Immunizations Immunization History  Administered Date(s) Administered   Fluad Quad(high Dose 65+) 05/30/2019   Fluad Trivalent(High Dose 65+) 06/16/2023   INFLUENZA, HIGH DOSE SEASONAL PF 07/21/2018, 06/07/2021   Influenza Nasal 07/09/2017   Influenza Split 07/13/2012, 07/13/2014, 05/19/2022   Influenza-Unspecified 07/04/2013, 06/30/2015, 07/14/2016, 05/30/2019, 06/22/2020, 05/25/2022   Moderna Sars-Covid-2 Vaccination 11/12/2019, 12/10/2019, 07/19/2020   PPD Test 11/19/2015   Pneumococcal Conjugate-13 07/28/2013   Pneumococcal Polysaccharide-23 07/07/2003, 07/06/2014   Respiratory Syncytial Virus Vaccine,Recomb Aduvanted(Arexvy) 06/06/2022   Tdap 07/14/2013, 07/01/2023   Zoster Recombinant(Shingrix) 04/10/2018, 06/07/2018   Zoster, Live 09/14/2008    Screening Tests Health Maintenance  Topic Date Due   Influenza Vaccine  04/29/2024   COVID-19 Vaccine (4 - 2025-26 season) 05/30/2024   Medicare Annual Wellness (AWV)  06/02/2024   MAMMOGRAM  04/04/2025   Colonoscopy  05/26/2026   DTaP/Tdap/Td (3 - Td or Tdap) 06/30/2033   Pneumococcal Vaccine: 50+ Years  Completed   DEXA SCAN  Completed   Hepatitis C Screening  Completed   Zoster Vaccines- Shingrix  Completed   HPV VACCINES  Aged Out   Meningococcal B Vaccine  Aged Out    Health Maintenance Items Addressed: Discussed the need to update flu and covid vaccines  Additional Screening:  Vision Screening: Recommended annual ophthalmology exams for early detection of glaucoma and other disorders of the eye. Is the patient up to date with their annual eye exam?  Yes  Who is the provider or what is the name of the office in which the patient attends annual eye exams? [facility]  Dental Screening: Recommended annual dental exams for proper oral hygiene  Community Resource Referral / Chronic Care Management: CRR required this visit?  No   CCM required this visit?  No   Plan:    I have personally reviewed and noted  the following in the patient's chart:   Medical and social history Use of alcohol, tobacco or illicit drugs  Current medications and supplements including opioid prescriptions. Patient is currently taking opioid prescriptions. Information provided to patient regarding non-opioid alternatives. Patient advised to discuss non-opioid treatment plan with their provider. Functional ability and status Nutritional status Physical activity Advanced directives List of other physicians Hospitalizations, surgeries, and ER visits in previous 12 months Vitals Screenings to include cognitive, depression, and falls Referrals and appointments  In addition, I have reviewed and discussed with patient certain preventive protocols, quality metrics, and best practice recommendations. A written personalized care plan for preventive services as well as general preventive health recommendations were provided to patient.   Angeline Fredericks, LPN   [id number]   After Visit Summary: (MyChart) Due to this being a telephonic visit, the after visit summary with patients personalized plan was offered to patient via MyChart   Notes: Nothing significant to report at this time.

## 2024-06-06 NOTE — Patient Instructions (Signed)
 Ms. Hailey Hunt,  Thank you for taking the time for your Medicare Wellness Visit. I appreciate your continued commitment to your health goals. Please review the care plan we discussed, and feel free to reach out if I can assist you further.  Medicare recommends these wellness visits once per year to help you and your care team stay ahead of potential health issues. These visits are designed to focus on prevention, allowing your provider to concentrate on managing your acute and chronic conditions during your regular appointments.  Please note that Annual Wellness Visits do not include a physical exam. Some assessments may be limited, especially if the visit was conducted virtually. If needed, we may recommend a separate in-person follow-up with your provider.  Ongoing Care Seeing your primary care provider every 3 to 6 months helps us  monitor your health and provide consistent, personalized care.  Remember to update your flu and covid vaccines.   Referrals If a referral was made during today's visit and you haven't received any updates within two weeks, please contact the referred provider directly to check on the status.  Recommended Screenings:  Health Maintenance  Topic Date Due   Flu Shot  04/29/2024   COVID-19 Vaccine (4 - 2025-26 season) 05/30/2024   Mammogram  04/04/2025   Medicare Annual Wellness Visit  06/06/2025   Colon Cancer Screening  05/26/2026   DTaP/Tdap/Td vaccine (3 - Td or Tdap) 06/30/2033   Pneumococcal Vaccine for age over 29  Completed   DEXA scan (bone density measurement)  Completed   Hepatitis C Screening  Completed   Zoster (Shingles) Vaccine  Completed   HPV Vaccine  Aged Out   Meningitis B Vaccine  Aged Out       06/06/2024    9:14 AM  Advanced Directives  Does Patient Have a Medical Advance Directive? Yes  Type of Estate agent of [place];Living will  Copy of Healthcare Power of Attorney in Chart? No - copy requested   Advance Care  Planning is important because it: Ensures you receive medical care that aligns with your values, goals, and preferences. Provides guidance to your family and loved ones, reducing the emotional burden of decision-making during critical moments.  Vision: Annual vision screenings are recommended for early detection of glaucoma, cataracts, and diabetic retinopathy. These exams can also reveal signs of chronic conditions such as diabetes and high blood pressure.  Dental: Annual dental screenings help detect early signs of oral cancer, gum disease, and other conditions linked to overall health, including heart disease and diabetes.  Please see the attached documents for additional preventive care recommendations.  Managing Pain Without Opioids Opioids are strong medicines used to treat moderate to severe pain. For some people, especially those who have long-term (chronic) pain, opioids may not be the best choice for pain management due to: Side effects like nausea, constipation, and sleepiness. The risk of addiction (opioid use disorder). The longer you take opioids, the greater your risk of addiction. Pain that lasts for more than 3 months is called chronic pain. Managing chronic pain usually requires more than one approach and is often provided by a team of health care providers working together (multidisciplinary approach). Pain management may be done at a pain management center or pain clinic. How to manage pain without the use of opioids Use non-opioid medicines Non-opioid medicines for pain may include: Over-the-counter or prescription non-steroidal anti-inflammatory drugs (NSAIDs). These may be the first medicines used for pain. They work well for muscle and bone  pain, and they reduce swelling. Acetaminophen. This over-the-counter medicine may work well for milder pain but not swelling. Antidepressants. These may be used to treat chronic pain. A certain type of antidepressant (tricyclics) is  often used. These medicines are given in lower doses for pain than when used for depression. Anticonvulsants. These are usually used to treat seizures but may also reduce nerve (neuropathic) pain. Muscle relaxants. These relieve pain caused by sudden muscle tightening (spasms). You may also use a pain medicine that is applied to the skin as a patch, cream, or gel (topical analgesic), such as a numbing medicine. These may cause fewer side effects than medicines taken by mouth. Do certain therapies as directed Some therapies can help with pain management. They include: Physical therapy. You will do exercises to gain strength and flexibility. A physical therapist may teach you exercises to move and stretch parts of your body that are weak, stiff, or painful. You can learn these exercises at physical therapy visits and practice them at home. Physical therapy may also involve: Massage. Heat wraps or applying heat or cold to affected areas. Electrical signals that interrupt pain signals (transcutaneous electrical nerve stimulation, TENS). Weak lasers that reduce pain and swelling (low-level laser therapy). Signals from your body that help you learn to regulate pain (biofeedback). Occupational therapy. This helps you to learn ways to function at home and work with less pain. Recreational therapy. This involves trying new activities or hobbies, such as a physical activity or drawing. Mental health therapy, including: Cognitive behavioral therapy (CBT). This helps you learn coping skills for dealing with pain. Acceptance and commitment therapy (ACT) to change the way you think and react to pain. Relaxation therapies, including muscle relaxation exercises and mindfulness-based stress reduction. Pain management counseling. This may be individual, family, or group counseling.  Receive medical treatments Medical treatments for pain management include: Nerve block injections. These may include a pain blocker  and anti-inflammatory medicines. You may have injections: Near the spine to relieve chronic back or neck pain. Into joints to relieve back or joint pain. Into nerve areas that supply a painful area to relieve body pain. Into muscles (trigger point injections) to relieve some painful muscle conditions. A medical device placed near your spine to help block pain signals and relieve nerve pain or chronic back pain (spinal cord stimulation device). Acupuncture. Follow these instructions at home Medicines Take over-the-counter and prescription medicines only as told by your health care provider. If you are taking pain medicine, ask your health care providers about possible side effects to watch out for. Do not drive or use heavy machinery while taking prescription opioid pain medicine. Lifestyle  Do not use drugs or alcohol to reduce pain. If you drink alcohol, limit how much you have to: 0-1 drink a day for women who are not pregnant. 0-2 drinks a day for men. Know how much alcohol is in a drink. In the [place] one drink equals one 12 oz bottle of beer (355 mL), one 5 oz glass of wine (148 mL), or one 1 oz glass of hard liquor (44 mL). Do not use any products that contain nicotine or tobacco. These products include cigarettes, chewing tobacco, and vaping devices, such as e-cigarettes. If you need help quitting, ask your health care provider. Eat a healthy diet and maintain a healthy weight. Poor diet and excess weight may make pain worse. Eat foods that are high in fiber. These include fresh fruits and vegetables, whole grains, and beans. Limit foods  that are high in fat and processed sugars, such as fried and sweet foods. Exercise regularly. Exercise lowers stress and may help relieve pain. Ask your health care provider what activities and exercises are safe for you. If your health care provider approves, join an exercise class that combines movement and stress reduction. Examples include yoga and  tai chi. Get enough sleep. Lack of sleep may make pain worse. Lower stress as much as possible. Practice stress reduction techniques as told by your therapist. General instructions Work with all your pain management providers to find the treatments that work best for you. You are an important member of your pain management team. There are many things you can do to reduce pain on your own. Consider joining an online or in-person support group for people who have chronic pain. Keep all follow-up visits. This is important. Where to find more information You can find more information about managing pain without opioids from: American Academy of Pain Medicine: painmed.org Institute for Chronic Pain: instituteforchronicpain.org American Chronic Pain Association: theacpa.org Contact a health care provider if: You have side effects from pain medicine. Your pain gets worse or does not get better with treatments or home therapy. You are struggling with anxiety or depression. Summary Many types of pain can be managed without opioids. Chronic pain may respond better to pain management without opioids. Pain is best managed when you and a team of health care providers work together. Pain management without opioids may include non-opioid medicines, medical treatments, physical therapy, mental health therapy, and lifestyle changes. Tell your health care providers if your pain gets worse or is not being managed well enough. This information is not intended to replace advice given to you by your health care provider. Make sure you discuss any questions you have with your health care provider. Document Revised: 12/26/2020 Document Reviewed: 12/26/2020 Elsevier Patient Education  2024 [employer].

## 2024-06-08 ENCOUNTER — Ambulatory Visit (INDEPENDENT_AMBULATORY_CARE_PROVIDER_SITE_OTHER): Payer: Medicare Other | Admitting: Internal Medicine

## 2024-06-08 ENCOUNTER — Encounter: Payer: Self-pay | Admitting: Internal Medicine

## 2024-06-08 VITALS — BP 96/58 | HR 65 | Ht 63.0 in | Wt 91.6 lb

## 2024-06-08 DIAGNOSIS — D510 Vitamin B12 deficiency anemia due to intrinsic factor deficiency: Secondary | ICD-10-CM

## 2024-06-08 DIAGNOSIS — I150 Renovascular hypertension: Secondary | ICD-10-CM | POA: Diagnosis not present

## 2024-06-08 DIAGNOSIS — N182 Chronic kidney disease, stage 2 (mild): Secondary | ICD-10-CM

## 2024-06-08 DIAGNOSIS — R7303 Prediabetes: Secondary | ICD-10-CM

## 2024-06-08 DIAGNOSIS — E890 Postprocedural hypothyroidism: Secondary | ICD-10-CM

## 2024-06-08 DIAGNOSIS — I773 Arterial fibromuscular dysplasia: Secondary | ICD-10-CM

## 2024-06-08 DIAGNOSIS — E782 Mixed hyperlipidemia: Secondary | ICD-10-CM

## 2024-06-08 DIAGNOSIS — I701 Atherosclerosis of renal artery: Secondary | ICD-10-CM

## 2024-06-08 LAB — COMPREHENSIVE METABOLIC PANEL WITH GFR
ALT: 11 U/L (ref 0–35)
AST: 22 U/L (ref 0–37)
Albumin: 4.6 g/dL (ref 3.5–5.2)
Alkaline Phosphatase: 36 U/L — ABNORMAL LOW (ref 39–117)
BUN: 20 mg/dL (ref 6–23)
CO2: 30 meq/L (ref 19–32)
Calcium: 10 mg/dL (ref 8.4–10.5)
Chloride: 103 meq/L (ref 96–112)
Creatinine, Ser: 1.25 mg/dL — ABNORMAL HIGH (ref 0.40–1.20)
GFR: 42.07 mL/min — ABNORMAL LOW (ref >60.00)
Glucose, Bld: 96 mg/dL (ref 70–99)
Potassium: 4.4 meq/L (ref 3.5–5.1)
Sodium: 142 meq/L (ref 135–145)
Total Bilirubin: 0.6 mg/dL (ref 0.2–1.2)
Total Protein: 6.5 g/dL (ref 6.0–8.3)

## 2024-06-08 LAB — HEMOGLOBIN A1C: Hgb A1c MFr Bld: 6.1 % (ref 4.6–6.5)

## 2024-06-08 LAB — CBC WITH DIFFERENTIAL/PLATELET
Basophils Absolute: 0.1 K/uL (ref 0.0–0.1)
Basophils Relative: 1.2 % (ref 0.0–3.0)
Eosinophils Absolute: 0.1 K/uL (ref 0.0–0.7)
Eosinophils Relative: 1.7 % (ref 0.0–5.0)
HCT: 34.6 % — ABNORMAL LOW (ref 36.0–46.0)
Hemoglobin: 11.4 g/dL — ABNORMAL LOW (ref 12.0–15.0)
Lymphocytes Relative: 21.1 % (ref 12.0–46.0)
Lymphs Abs: 1 K/uL (ref 0.7–4.0)
MCHC: 33.1 g/dL (ref 30.0–36.0)
MCV: 89.7 fl (ref 78.0–100.0)
Monocytes Absolute: 0.3 K/uL (ref 0.1–1.0)
Monocytes Relative: 7 % (ref 3.0–12.0)
Neutro Abs: 3.3 K/uL (ref 1.4–7.7)
Neutrophils Relative %: 69 % (ref 43.0–77.0)
Platelets: 312 K/uL (ref 150.0–400.0)
RBC: 3.85 Mil/uL — ABNORMAL LOW (ref 3.87–5.11)
RDW: 14.7 % (ref 11.5–15.5)
WBC: 4.8 K/uL (ref 4.0–10.5)

## 2024-06-08 LAB — LIPID PANEL
Cholesterol: 158 mg/dL (ref 0–200)
HDL: 89.8 mg/dL (ref >39.00)
LDL Cholesterol: 57 mg/dL (ref 0–99)
NonHDL: 68.28
Total CHOL/HDL Ratio: 2
Triglycerides: 58 mg/dL (ref 0.0–149.0)
VLDL: 11.6 mg/dL (ref 0.0–40.0)

## 2024-06-08 LAB — LDL CHOLESTEROL, DIRECT: Direct LDL: 57 mg/dL

## 2024-06-08 MED ORDER — PFIZER-BIONTECH COVID-19 VACC 30 MCG/0.3ML IM SUSP: 0.3000 mL | Freq: Once | INTRAMUSCULAR | 0 refills | Status: AC

## 2024-06-08 NOTE — Progress Notes (Unsigned)
 Subjective:  Patient ID: Hailey Hunt, female    DOB: 1945-11-16  Age: 78 y.o. MRN: [id number]  CC: The primary encounter diagnosis was Hypothyroidism following radioiodine therapy. Diagnoses of Prediabetes, Mixed hyperlipidemia, and Pernicious anemia were also pertinent to this visit.   HPI Hailey Hunt presents for  Chief Complaint  Patient presents with   Medical Management of Chronic Issues  Wantins COVID vaccine ,  sent to [facility]     FMD: Taking olmesartan 2.5 mg every other day and 81 mg aspirin every other day for FMD   Bleeding disorder under investigation  FActor 8 suspected.   CKD stage 3 with hyperkalemia:  potassium was lowered with dietary restriction of soda .  Drinking lemonade   Daughter  Rocky getting CT scan of chest abd nd pelvis on  Friday at [facility] to rule out kidney stone. . Urine dipstick was positive for blood.   FMD:  Sees Sh[facility] ([facility] ) next week in [place] (he is moving)    Tolerating Bempedoic acid for management of hyperlipidemia notices that her stools now float .  Reduction in LDL significant    Lab Results  Component Value Date   CHOL 164 05/25/2023   HDL 81.10 05/25/2023   LDLCALC 66 05/25/2023   LDLDIRECT 92.0 11/19/2015   TRIG 85.0 05/25/2023   CHOLHDL 2 05/25/2023      Outpatient Medications Prior to Visit  Medication Sig Dispense Refill   ASPIRIN 81 PO      Bempedoic Acid-Ezetimibe  (NEXLIZET ) 180-10 MG TABS Take 1 tablet by mouth daily. 90 tablet 3   cholecalciferol (VITAMIN D ) 1000 UNITS tablet Take 1,000 Units by mouth daily.     denosumab  (PROLIA ) 60 MG/ML SOSY injection      mometasone  (ELOCON ) 0.1 % cream APPLY TWO TIMES A DAY TO BOTH EARS 15 g 1   olmesartan (BENICAR) 5 MG tablet Take 2.5 mg by mouth daily.     promethazine -dextromethorphan (PROMETHAZINE -DM) 6.25-15 MG/5ML syrup Take 5 mLs by mouth 4 (four) times daily as needed for cough. 180 mL 1   SYNTHROID  50 MCG tablet TAKE 1 TABLET BY MOUTH EVERY MORNING ON AN  EMPTY STOMACH WITH A FULL GLASS OF WATER AT LEAST 30-60 MINUTES BEFORE BREAKFAST 90 tablet 1   traMADol  (ULTRAM ) 50 MG tablet Take 1 tablet (50 mg total) by mouth every 8 (eight) hours as needed. 30 tablet 3   tretinoin  (RETIN-A ) 0.025 % cream APPLY TOPICALLY EVERY NIGHT AT BEDTIME 45 g 1   Facility-Administered Medications Prior to Visit  Medication Dose Route Frequency Provider Last Rate Last Admin   [START ON 11/06/2024] denosumab  (PROLIA ) injection 60 mg  60 mg Subcutaneous Once Gherghe, Cristina, MD        Review of Systems;  Patient denies headache, fevers, malaise, unintentional weight loss, skin rash, eye pain, sinus congestion and sinus pain, sore throat, dysphagia,  hemoptysis , cough, dyspnea, wheezing, chest pain, palpitations, orthopnea, edema, abdominal pain, nausea, melena, diarrhea, constipation, flank pain, dysuria, hematuria, urinary  Frequency, nocturia, numbness, tingling, seizures,  Focal weakness, Loss of consciousness,  Tremor, insomnia, depression, anxiety, and suicidal ideation.      Objective:  BP (!) 96/58   Pulse 65   Ht 5' 3 (1.6 m)   Wt 91 lb 9.6 oz (41.5 kg)   SpO2 99%   BMI 16.23 kg/m   BP Readings from Last 3 Encounters:  06/08/24 (!) 96/58  08/17/23 122/68  07/28/23 138/70    Wt Readings from  Last 3 Encounters:  06/08/24 91 lb 9.6 oz (41.5 kg)  06/06/24 92 lb 4 oz (41.8 kg)  08/17/23 94 lb 3.2 oz (42.7 kg)    Physical Exam  Lab Results  Component Value Date   HGBA1C 5.5 03/05/2023   HGBA1C 6.1 12/01/2022   HGBA1C 5.9 07/24/2022    Lab Results  Component Value Date   CREATININE 1.19 10/28/2023   CREATININE 1.12 (H) 08/17/2023   CREATININE 1.19 06/02/2023    Lab Results  Component Value Date   WBC 6.9 06/02/2023   HGB 11.9 (L) 06/02/2023   HCT 37.1 06/02/2023   PLT 280.0 06/02/2023   GLUCOSE 91 10/28/2023   CHOL 164 05/25/2023   TRIG 85.0 05/25/2023   HDL 81.10 05/25/2023   LDLDIRECT 92.0 11/19/2015   LDLCALC 66 05/25/2023    ALT 16 08/17/2023   AST 29 08/17/2023   NA 139 10/28/2023   K 4.6 10/28/2023   CL 101 10/28/2023   CREATININE 1.19 10/28/2023   BUN 29 (H) 10/28/2023   CO2 30 10/28/2023   TSH 4.28 06/02/2023   HGBA1C 5.5 03/05/2023    DG Bone Density Result Date: 01/01/2023 Table formatting from the original result was not included. Date of study: 12/30/2022 Exam: DUAL X-RAY ABSORPTIOMETRY (DXA) FOR BONE MINERAL DENSITY (BMD) Instrument: [employer] Requesting Provider: PCP Indication: follow up for low BMD Comparison: 2022 (please note that it is not possible to compare data from different instruments) Clinical data: Pt is a 78 y.o. female without previous history of fracture. Results:  Lumbar spine L1-L4 Femoral neck (FN) T-score -0.3 RFN: -2.1 LFN: -2.1 Change in BMD from previous DXA test (%) Down 1.5% 0.0 (*) statistically significant Assessment: By the [facility] Criteria for diagnosis based on bone density, this patient has Low Bone Density FRAX 10-year fracture risk calculator: Not calculated, Patient on Denosumab  Comments: the technical quality of the study is good. WHO criteria for diagnosis of osteoporosis in postmenopausal women and in men 62 y/o or older: - normal: T-score -1.0 to + 1.0 - osteopenia/low bone density: T-score between -2.5 and -1.0 - osteoporosis: T-score below -2.5 - severe osteoporosis: T-score below -2.5 with history of fragility fracture Note: although not part of the WHO classification, the presence of a fragility fracture, regardless of the T-score, should be considered diagnostic of osteoporosis, provided other causes for the fracture have been excluded. Follow up BMD is recommended: 2 years. Interpreted by : Stefano Redgie Butts, MD [facility]    Assessment & Plan:  .Hypothyroidism following radioiodine therapy  Prediabetes  Mixed hyperlipidemia  Pernicious anemia     I spent 34 minutes on the day of this face to face encounter reviewing patient's  most  recent visit with cardiology,  nephrology,  and neurology,  prior relevant surgical and non surgical procedures, recent  labs and imaging studies, counseling on weight management,  reviewing the assessment and plan with patient, and post visit ordering and reviewing of  diagnostics and therapeutics with patient  .   Follow-up: No follow-ups on file.   Verneita LITTIE Kettering, MD

## 2024-06-08 NOTE — Patient Instructions (Signed)
 I have sent the COVID vaccine prescriptions for you and Erin to [facility] .  Expect some confusion and delay ( from what I have heard,  there is a lot....)    I have given them permission to choose whatever vaccine they have available

## 2024-06-09 LAB — NMR, LIPOPROFILE
Cholesterol, Total: 169 mg/dL (ref 100–199)
HDL Particle Number: 41.6 umol/L (ref >=30.5)
HDL-C: 89 mg/dL (ref >39)
LDL Particle Number: 514 nmol/L (ref <1000)
LDL Size: 21.1 nm (ref >20.5)
LDL-C (NIH Calc): 70 mg/dL (ref 0–99)
LP-IR Score: 29 (ref <=45)
Small LDL Particle Number: <90 nmol/L (ref <=527)
Triglycerides: 49 mg/dL (ref 0–149)

## 2024-06-09 NOTE — Assessment & Plan Note (Signed)
 Tolerating zetia .  LDL is < 70 .  SHE IS STATIN INTOLERANT . Bempedoic acid added with good results     Lab Results  Component Value Date   CHOL 158 06/08/2024   HDL 89.80 06/08/2024   LDLCALC 57 06/08/2024   LDLDIRECT 57.0 06/08/2024   TRIG 58.0 06/08/2024   CHOLHDL 2 06/08/2024

## 2024-06-09 NOTE — Assessment & Plan Note (Signed)
 She is following her [facility] FMD specialist to [facility]

## 2024-06-09 NOTE — Assessment & Plan Note (Signed)
 GFR has been < 50 ml /min   ;  she is followed by [facility].  Continue ARB  Lab Results  Component Value Date   CREATININE 1.25 (H) 06/08/2024

## 2024-06-09 NOTE — Assessment & Plan Note (Addendum)
 Well controlled on current regimen. Of 2.5 mg olmesartan  every other day   Renal function stable, no changes today.   Lab Results  Component Value Date   CREATININE 1.25 (H) 06/08/2024   Lab Results  Component Value Date   NA 142 06/08/2024   K 4.4 06/08/2024   CL 103 06/08/2024   CO2 30 06/08/2024

## 2024-06-10 ENCOUNTER — Ambulatory Visit: Payer: Self-pay | Admitting: Internal Medicine

## 2024-06-13 ENCOUNTER — Telehealth: Payer: Self-pay

## 2024-06-13 ENCOUNTER — Telehealth: Payer: Self-pay | Admitting: Internal Medicine

## 2024-06-13 DIAGNOSIS — R350 Frequency of micturition: Secondary | ICD-10-CM | POA: Diagnosis not present

## 2024-06-13 DIAGNOSIS — N1831 Chronic kidney disease, stage 3a: Secondary | ICD-10-CM | POA: Diagnosis not present

## 2024-06-13 DIAGNOSIS — R3129 Other microscopic hematuria: Secondary | ICD-10-CM | POA: Diagnosis not present

## 2024-06-13 MED ORDER — NEXLIZET 180-10 MG PO TABS: 1.0000 | ORAL_TABLET | Freq: Every day | ORAL | 0 refills | Status: DC

## 2024-06-13 NOTE — Telephone Encounter (Signed)
*  STAT* If patient is at the pharmacy, call can be transferred to refill team.   1. Which medications need to be refilled? (please list name of each medication and dose if known)   Bempedoic Acid-Ezetimibe  (NEXLIZET ) 180-10 MG TABS   2. Would you like to learn more about the convenience, safety, & potential cost savings by using the [facility]?   3. Are you open to using the [facility] (Type [facility]. ).  4. Which pharmacy/location (including street and city if local pharmacy) is medication to be sent to?  [facility] [id number] - [place], [place] - [address]   5. Do they need a 30 day or 90 day supply?   30 day  Patient stated she still has some medication.  Patient has appointment scheduled with EMERSON Bane, NP on 11/19.

## 2024-06-13 NOTE — Telephone Encounter (Signed)
 Copied from CRM #[id number]. Topic: Clinical - Medical Advice >> Jun 13, 2024  9:12 AM [place] A wrote: Reason for CRM: Patient wants to know what to do about her GFR being high when she did a test? Please call

## 2024-06-13 NOTE — Telephone Encounter (Signed)
 Pt's medication was sent to pt's pharmacy as requested. Confirmation received.

## 2024-06-14 NOTE — Telephone Encounter (Signed)
 Spoke with pt and she stated that she did not call about an elevated GFR. She stated that she called because she had a urinalysis done yesterday at her nephrology office and it looked like it was positive for a UTI but pt had not heard from their office yet. I yet pt know that she will need to call their office to request the results and see if any medication is needed since they ordered this. Pt stated that she would give them a call.

## 2024-06-23 DIAGNOSIS — R3129 Other microscopic hematuria: Secondary | ICD-10-CM | POA: Diagnosis not present

## 2024-06-23 DIAGNOSIS — N1831 Chronic kidney disease, stage 3a: Secondary | ICD-10-CM | POA: Diagnosis not present

## 2024-06-23 DIAGNOSIS — R350 Frequency of micturition: Secondary | ICD-10-CM | POA: Diagnosis not present

## 2024-06-30 DIAGNOSIS — Z23 Encounter for immunization: Secondary | ICD-10-CM | POA: Diagnosis not present

## 2024-07-07 DIAGNOSIS — Z23 Encounter for immunization: Secondary | ICD-10-CM | POA: Diagnosis not present

## 2024-07-27 ENCOUNTER — Ambulatory Visit (INDEPENDENT_AMBULATORY_CARE_PROVIDER_SITE_OTHER): Payer: Medicare Other | Admitting: Internal Medicine

## 2024-07-27 ENCOUNTER — Encounter: Payer: Self-pay | Admitting: Internal Medicine

## 2024-07-27 ENCOUNTER — Other Ambulatory Visit

## 2024-07-27 VITALS — BP 104/66 | HR 77 | Ht 63.0 in | Wt 94.4 lb

## 2024-07-27 DIAGNOSIS — M810 Age-related osteoporosis without current pathological fracture: Secondary | ICD-10-CM | POA: Diagnosis not present

## 2024-07-27 DIAGNOSIS — E890 Postprocedural hypothyroidism: Secondary | ICD-10-CM

## 2024-07-27 DIAGNOSIS — M8889 Osteitis deformans of multiple sites: Secondary | ICD-10-CM

## 2024-07-27 NOTE — Progress Notes (Signed)
 Patient ID: Hailey [place], female   DOB: 16-Jun-1946, 78 y.o.   MRN: [id number]  HPI  Hailey [place] is a 78 y.o.-year-old female, returning for follow-up for Paget ds, osteopenia/osteoporosis, postablative hypothyroidism, prediabetes.  Last visit 1 year ago. Her daughter is also my pt.: Hailey [place].  Interim history: No falls or fractures since last visit.  No hip pain or joint pains.  Reviewed history: She had a CTA abd/pelvis on 06/2015:incidental lesion:  Diffuse patchy sclerosis and cortical thickening of the left iliac bone, favored to represent Paget's disease.  She has had an anteverted L femur since her 15s. She has had pain in that his ever since.    Bone scan (01/02/2016): There is asymmetric increased uptake throughout the left iliac bone. The appearance and distribution is compatible with Paget's disease. Increased uptake in the distribution of the nasal bone is also noted, nonspecific. This could be seen with Paget's disease, fibrous dysplasia or chronic sinusitis. No findings identified to suggest bone metastases. Physiologic uptake is noted within the kidneys and urinary bladder.   IMPRESSION: 1. Increased uptake within the left iliac bone compatible with Paget's disease. 2. Central area of increased uptake in the region of the nasal bone which may also reflect Paget's disease of the skull.   Per alkaline phosphatase levels are in the normal range: Lab Results  Component Value Date   ALKPHOS 36 (L) 06/08/2024   ALKPHOS 58 08/17/2023   ALKPHOS 54 05/25/2023   ALKPHOS 65 02/25/2023   ALKPHOS 72 12/23/2022   ALKPHOS 66 09/01/2022   ALKPHOS 144 (H) 06/25/2022   ALKPHOS 235 (H) 06/18/2022   ALKPHOS 381 (H) 06/11/2022   ALKPHOS 200 (H) 06/04/2022   She previously had elevated LFTs after being dx'ed with atherosclerosis at [facility] >> started on Lipitor.  She developed increased LFTs, extensively investigated by PCP and hepatologist.  She was taken off the statin.  Also  off vitamin D . Recent LFTs were normal: Lab Results  Component Value Date   ALT 11 06/08/2024   AST 22 06/08/2024   ALKPHOS 36 (L) 06/08/2024   BILITOT 0.6 06/08/2024   Previously: Component     Latest Ref Rng 06/04/2022 06/11/2022 06/18/2022 06/25/2022  Total Bilirubin     0.2 - 1.2 mg/dL 1.0  1.4 (H)  0.9  0.8   Alkaline Phosphatase     39 - 117 U/L 200 (H)  381 (H)  235 (H)  144 (H)   AST     0 - 37 U/L 199 (H)  167 (H)  39 (H)  29   ALT     0 - 35 U/L 163 (H)  195 (H)  65 (H)  28    Osteopenia/osteoporosis:  Reviewed previous bone density scan reports:  12/30/2022 ([place])  Lumbar spine L1-L4 Femoral neck (FN)  T-score -0.3 RFN: -2.1 LFN: -2.1  Change in BMD from previous DXA test (%) -1.5% 0.0  (*) statistically significant   12/30/2020 ([place]) Lumbar spine L1-L4 Femoral neck (FN) 33% distal radius   -0.1 RFN: -2.1 LFN: -2.1 n/a  Change in BMD from previous DXA test (%) +4.2%* +4.0% n/a  (*) statistically significant  Date L1-L4 T score FN T scores FRAX score  12/21/2018 ([place]) -0.5 (-1.6%) RFN: -2.4 LFN: -2.2  (-3%) 10-year MOF: 10.8% 10-year hip fracture risk: 3  12/16/2016 ([place]) -0.40 RFN:-2.2 LFN:-2.1   01/22/2012 ([facility])  -0.60 LFN: -0.66     She had a traumatic vertebral fracture as a child.  She has a history of dizziness, but not recently.  Previous osteoporosis treatments: - Actonel for 2 years. This was apparently stopped due to recurrent kidney stones -last episode 2010.  She did not feel good on this (stomach upset, weakness, fatigue). - Prolia  - 08/10/2019, 02/08/2020, 08/30/2020, 03/08/2021, 09/11/2021, 03/28/2022, 10/29/2022, 05/01/2023, 01/17/2024: 05/06/2024.  Latest vitamin D  levels have been normal, she previously had a low level in 2013: Lab Results  Component Value Date   VD25OH 51.49 08/06/2023   VD25OH 55.86 09/01/2022   VD25OH 71.44 11/28/2021   VD25OH 43.19 05/31/2021   VD25OH 52.64 11/28/2020   VD25OH 38.5 07/18/2020   VD25OH  55.06 12/30/2018   VD25OH 60.99 02/02/2018   VD25OH 55.42 07/13/2017   VD25OH 49.37 12/12/2016   She has been on 1000 units vitamin D  daily for years - then a MVI. Off due to elevated LFTs at last visit. Now back on it.  She continues to walk. In the past, During this walks, she talks on the telephone with her sister in [place] , who is also walking.  She also has an apple watch and completes all the circles every day.  She had surgical menopause at 78 years old.   Pt does not have a FH of osteoporosis.  No history of hypo or hypercalcemia or hyperparathyroidism: Lab Results  Component Value Date   PTH 38 05/31/2021   CALCIUM 10.0 06/08/2024   CALCIUM 9.8 10/28/2023   CALCIUM 10.3 08/17/2023   CALCIUM 10.2 06/02/2023   CALCIUM 9.7 05/25/2023   CALCIUM 9.5 02/25/2023   CALCIUM 9.1 01/07/2023   CALCIUM 9.8 12/23/2022   CALCIUM 9.0 12/01/2022   CALCIUM 8.7 09/01/2022   She has a history of Graves' disease status post RAI treatment in the 2s.  She developed post ablative hypothyroidism.  Pt is on Synthroid  50 mcg daily, taken: - in am (at 4:30 am - then goes back to sleep) - fasting - at least 30 min from b'fast - no Ca, Fe, MVI, PPIs - not on Biotin  On vitamin B12.  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 (seeing nephrology and vascular ); latest BUN/creatinine: Lab Results  Component Value Date   BUN 20 06/08/2024   CREATININE 1.25 (H) 06/08/2024   Prediabetes: Reviewed her HbA1c levels: Lab Results  Component Value Date   HGBA1C 6.1 06/08/2024   HGBA1C 5.5 03/05/2023   HGBA1C 6.1 12/01/2022   HGBA1C 5.9 07/24/2022   HGBA1C 5.4 07/18/2021   HGBA1C 5.8 11/28/2020   HGBA1C 6.0 05/25/2020   HGBA1C 5.9 05/23/2019   HGBA1C 5.8 11/26/2018   HGBA1C 5.8 08/17/2018   Investigation for type 1 diabetes  was negative: Component     Latest Ref Rng & Units 06/30/2018  C-Peptide     0.80 - 3.85 ng/mL 2.45  Glucose, Plasma     65 - 99 mg/dL 93  ZNT8 Antibodies      negative  Glutamic Acid Decarb Ab     <5 IU/mL <5  Islet Cell Ab     Neg:<1:1 Negative   ROS: + See HPI  I reviewed pt's medications, allergies, PMH, social hx, family hx, and changes were documented in the history of present illness. Otherwise, unchanged from my initial visit note.  Past Medical History:  Diagnosis Date  Aberrant right subclavian artery    Carotid artery occlusion    FMD   Cataract    surgery   Clotting disorder    hx of bleeding after vaginal delivery, dr told her not to have any surgeries that are necessary   Diabetes mellitus without complication (HCC)    Pred-diabetes   Dysplasia, artery, fibromuscular, renal    Fibromuscular dysplasia    Fibromuscular dysplasia of renal artery    managed by Dr. Gussie [facility]   Graves disease    Heart murmur    Heart murmur    functional   Hyperlipidemia    failed statin - currently taking Zetia    Hypertension    Amlodopine 25mg  1/2 pill every other day   Kidney stone    Migraines    Neuropathy with IgA monoclonal gammopathy    Dr Lajuana   Paget's disease of the bone    Pernicious anemia    Thyroid  disease    Graves disease RAI [facility]   Urinary incontinence, mixed    resolved with removal of ained sutures in bladder    Past Surgical History:  Procedure Laterality Date   BILATERAL SALPINGOOPHORECTOMY     CATARACT EXTRACTION  Sep 10, 2011, Jan 13,2013   COLONOSCOPY     EYE SURGERY  2012-2013   cataract removal with IOL - [facility]   RENAL BIOPSY     TONSILLECTOMY     VAGINAL DELIVERY     x2   VAGINAL HYSTERECTOMY     WISDOM TOOTH EXTRACTION     Social History   Socioeconomic History   Marital status: Married    Spouse name: Not on file   Number of children: 2   Years of education: Not on file   Highest education level: Associate  degree: occupational, scientist, product/process development, or vocational program  Occupational History   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  Tobacco Use   Smoking status: Never   Smokeless tobacco: Never  Vaping Use   Vaping status: Never Used  Substance and Sexual Activity   Alcohol use: Yes    Comment: once a year   Drug use: No   Sexual activity: Not Currently  Other Topics Concern   Not on file  Social History Narrative   Married   Social Drivers of Health   Financial Resource Strain: Low Risk  (06/02/2024)   Overall Financial Resource Strain (CARDIA)    Difficulty of Paying Living Expenses: Not hard at all  Food Insecurity: No Food Insecurity (06/02/2024)   Hunger Vital Sign    Worried About Running Out of Food in the Last Year: Never true    Ran Out of Food in the Last Year: Never true  Transportation Needs: No Transportation Needs (06/02/2024)   PRAPARE - Administrator, Civil Service (Medical): No    Lack of Transportation (Non-Medical): No  Physical Activity: Sufficiently Active (06/02/2024)   Exercise Vital Sign    Days of Exercise per Week: 7 days    Minutes of Exercise per Session: 60 min  Stress: Stress Concern Present (06/02/2024)   [employer] of Occupational Health - Occupational Stress Questionnaire    Feeling of Stress: To some extent  Social Connections: Moderately Integrated (06/02/2024)   Social Connection and Isolation Panel    Frequency of Communication with Friends and Family: More than three times a week    Frequency of Social Gatherings with Friends and Family: Once a week    Attends Religious Services: Never  Active Member of Clubs or Organizations: Yes    Attends Banker Meetings: Never    Marital Status: Married  Catering Manager Violence: Not At Risk (06/06/2024)   Humiliation, Afraid, Rape, and Kick questionnaire    Fear of Current or Ex-Partner: No    Emotionally Abused: No    Physically Abused: No    Sexually Abused: No   Current Outpatient  Medications on File Prior to Visit  Medication Sig Dispense Refill   ASPIRIN 81 PO      Bempedoic Acid-Ezetimibe  (NEXLIZET ) 180-10 MG TABS Take 1 tablet by mouth daily. 90 tablet 0   cholecalciferol (VITAMIN D ) 1000 UNITS tablet Take 1,000 Units by mouth daily.     denosumab  (PROLIA ) 60 MG/ML SOSY injection      mometasone  (ELOCON ) 0.1 % cream APPLY TWO TIMES A DAY TO BOTH EARS 15 g 1   olmesartan (BENICAR) 5 MG tablet Take 2.5 mg by mouth daily.     promethazine -dextromethorphan (PROMETHAZINE -DM) 6.25-15 MG/5ML syrup Take 5 mLs by mouth 4 (four) times daily as needed for cough. 180 mL 1   SYNTHROID  50 MCG tablet TAKE 1 TABLET BY MOUTH EVERY MORNING ON AN EMPTY STOMACH WITH A FULL GLASS OF WATER AT LEAST 30-60 MINUTES BEFORE BREAKFAST 90 tablet 1   traMADol  (ULTRAM ) 50 MG tablet Take 1 tablet (50 mg total) by mouth every 8 (eight) hours as needed. 30 tablet 3   tretinoin  (RETIN-A ) 0.025 % cream APPLY TOPICALLY EVERY NIGHT AT BEDTIME 45 g 1   Current Facility-Administered Medications on File Prior to Visit  Medication Dose Route Frequency Provider Last Rate Last Admin   [START ON 11/06/2024] denosumab  (PROLIA ) injection 60 mg  60 mg Subcutaneous Once Jorgen Wolfinger, MD       Allergies  Allergen Reactions   Voltaren [Diclofenac] Hypertension   Adhesive [Tape]    Atorvastatin Other (See Comments)   Demerol     HYPOTENSION   Macrodantin    Nsaids Hypertension   Voltaren [Diclofenac Sodium] Other (See Comments)    FMD, increased bp   Codeine Nausea And Vomiting and Rash   Nickel Rash   Penicillins Rash   Sulfa Antibiotics Rash    Fever   Family History  Problem Relation Age of Onset   Anuerysm Mother    COPD Mother    Hypertension Mother    Heart disease Mother        COPD   Heart failure Mother    Asthma Mother    Hyperlipidemia Mother        deceased at age 23   Cancer Father        lung CA   Heart disease Father        Multiple heart attacks   Heart attack Father     Hyperlipidemia Father        deceased at age 55   Heart disease Brother        deceased at age 73 viral cardiomyopathy   Drug abuse Brother    Hypertension Brother    Cancer Maternal Aunt        esophageal and live rCA,  hisotory of  ETOH and tobacco   Anuerysm Maternal Grandfather    COPD Maternal Grandfather    Cancer Paternal Grandmother        brain tumor   Diabetes Sister    Hypertension Sister    Heart disease Paternal Grandfather        fatal heart attack  Colon cancer Paternal Uncle    Cancer Maternal Grandmother        uterine, cervical   Alcohol abuse Maternal Aunt    Cancer Maternal Aunt        lung, liver   Birth defects Daughter        brain   Kidney disease Daughter        microhematuria (urine)   Learning disabilities Daughter        Lissencephaly/SBH   Mental retardation Daughter        Lissencephaly/SBH   Diabetes Sister        T2 - gastric bypass - uses Trulicity   Hypertension Sister    Diabetes Maternal Uncle        T2   Drug abuse Brother        cocaine   Heart disease Brother        viral cardiomyopathy   Birth defects Daughter    Intellectual disability Daughter    Kidney disease Daughter    Learning disabilities Daughter    Diabetes Maternal Uncle    Diabetes Maternal Uncle    Diabetes Paternal Aunt    PE: BP 104/66 (BP Location: Left Arm, Patient Position: Sitting, Cuff Size: Normal)   Pulse 77   Ht 5' 3 (1.6 m)   Wt 94 lb 6.4 oz (42.8 kg)   SpO2 99%   BMI 16.72 kg/m   Wt Readings from Last 15 Encounters:  07/27/24 94 lb 6.4 oz (42.8 kg)  06/08/24 91 lb 9.6 oz (41.5 kg)  06/06/24 92 lb 4 oz (41.8 kg)  08/17/23 94 lb 3.2 oz (42.7 kg)  07/28/23 93 lb 12.8 oz (42.5 kg)  06/03/23 90 lb 6 oz (41 kg)  06/02/23 90 lb 9.6 oz (41.1 kg)  05/05/23 93 lb 3.2 oz (42.3 kg)  05/01/23 92 lb 3.2 oz (41.8 kg)  03/03/23 91 lb 12.8 oz (41.6 kg)  12/11/22 87 lb (39.5 kg)  12/01/22 86 lb 3.2 oz (39.1 kg)  11/14/22 85 lb 9.6 oz (38.8 kg)   10/30/22 88 lb 9.6 oz (40.2 kg)  10/29/22 89 lb 9.6 oz (40.6 kg)   Constitutional: thin, in NAD Eyes:  EOMI, no exophthalmos ENT: no thyromegaly, no cervical lymphadenopathy Cardiovascular: RRR, No MRG Respiratory: CTA B Musculoskeletal: no deformities Skin: moist, warm, no rashes Neurological: + tremor with outstretched hands (chronic)  Assessment: 1. Paget disease  2.  Clinical osteoporosis  3. Postablative hypothyroidism - RAI tx for Graves ds. In 1980s  4.  Prediabetes  Plan: 1. Paget ds. - Stable, without pain or warmth at the site of her pagetoid lesions -She initially had a lesion in the left hip observed incidentally on pelvic CTA at [facility]. Her FMD is managed at [facility].  This was again noticed on bone scan from 2017, along with a nasal focus.  She did not have any signs of new lesions since then. -We discussed that Paget's disease lesions do not need to be treated unless they are painful, contiguous with the joint, or in weightbearing joints.  Also, consideration for treatment should be given in case alkaline phosphatase increases.  Her alkaline phosphatase levels was normal, but she had increased LFTs after starting a statin, which was further extensively investigated with negative results.  She was taken off statins, of which she feels much better.  She is on Nexlizet  per Dr. Mona. - We discussed the severe complications of Paget's disease including bone cancer, fractures, heart failure, are very rare  nowadays, especially in a case as mild as hers - She wanted to avoid bisphosphonates due to previous intolerance but she is now on Prolia , tolerated well - plan to repeat a DXA scan in 6 months.  2.  Clinical osteoporosis -In 2020, bone density scan showed slightly worse bone density scores at the femoral necks-however, this did not reach statistical significance.  However, she had another bone density scan in 12/2020 and this showed significant improvement of BMD at the level of  the spine and also improvement at the hips, but this did not reach statistical significance. - Her FRAX score was high, confirming clinical osteoporosis, for which treatment was indicated. She started Prolia  in 07/2019.  She tolerates this well, without jaw/thigh/hip pain - She also continues on 1000 units vitamin D  daily - Latest vitamin D  level was normal: Lab Results  Component Value Date   VD25OH 51.49 08/06/2023  - Will repeat the level today  3. Postablative hypothyroidism - latest thyroid  labs reviewed with pt. >> normal: Lab Results  Component Value Date   TSH 4.28 06/02/2023  - she continues on LT4 DAW 50 mcg daily - pt feels good on this dose. - we discussed about taking the thyroid  hormone every day, with water, >30 minutes before breakfast, separated by >4 hours from acid reflux medications, calcium, iron, multivitamins. Pt. is taking it correctly. - will check thyroid  tests today: TSH and fT4 - If labs are abnormal, she will need to return for repeat TFTs in 1.5 months  4.  Prediabetes - HbA1c levels fluctuate between the normal range and prediabetic range - Highest HbA1c was was 6.1%, still within the prediabetic range - She continues to adjust her diet by cutting out fast foods, concentrated sweets, and starches. - Investigation for type 1 diabetes returned negative - For now, no pharmaceutical intervention is needed.  Continue diet and exercise.  Needs refills x 1 year.  Orders Placed This Encounter  Procedures   TSH   T4, free   VITAMIN D  25 Hydroxy (Vit-D Deficiency, Fractures)   Lela Fendt, MD PhD [facility]

## 2024-07-27 NOTE — Patient Instructions (Addendum)
 Please continue: - Vitamin D  1000 units daily - Synthroid  50 mcg daily.  Take the thyroid  hormone every day, with water, at least 30 minutes before breakfast, separated by at least 4 hours from: - acid reflux medications - calcium - iron - multivitamins  Please stop at the lab.  Please send me a message in April so I can order your next DXA scan.  Please come back for a follow-up appointment in 1 year.

## 2024-07-28 LAB — VITAMIN D 25 HYDROXY (VIT D DEFICIENCY, FRACTURES): Vit D, 25-Hydroxy: 47 ng/mL (ref 30–100)

## 2024-07-28 LAB — TSH: TSH: 0.78 m[IU]/L (ref 0.40–4.50)

## 2024-07-28 LAB — T4, FREE: Free T4: 1.5 ng/dL (ref 0.8–1.8)

## 2024-07-29 ENCOUNTER — Ambulatory Visit: Payer: Self-pay | Admitting: Internal Medicine

## 2024-07-29 MED ORDER — SYNTHROID 50 MCG PO TABS: ORAL_TABLET | ORAL | 3 refills | Status: AC

## 2024-07-29 NOTE — Addendum Note (Signed)
 Addended by: TRIXIE FILE on: 07/29/2024 01:30 PM   Modules accepted: Orders

## 2024-08-09 ENCOUNTER — Telehealth: Payer: Self-pay | Admitting: Internal Medicine

## 2024-08-09 NOTE — Telephone Encounter (Signed)
 NMR 05/2024 LDL 70. Do you want her to repeat prior to your visit?   Caelin Rosen S Paymon Rosensteel, NP

## 2024-08-09 NOTE — Telephone Encounter (Signed)
 Please call and let her know that as PCP did her labs 05/2024 she does not need additional labs prior to her upcoming office visit. Please update appt notes.   TY!  Hailey Waldren S Danaye Sobh, NP

## 2024-08-09 NOTE — Telephone Encounter (Signed)
 Pt called in stating she would like to have her [facility] at [facility] [place]. She wants to make sure they are released before she gets there. She is going to go on 11/13

## 2024-08-09 NOTE — Telephone Encounter (Signed)
 No need for additional labs prior to appointment with me on 11/19.

## 2024-08-09 NOTE — Telephone Encounter (Signed)
 Returned a call back to the pt.  Pt aware her PCP did labs on her back in Sept and she will not require additional labs from our office prior to her office visit appt.   Will update in appt notes.  Pt verbalized understanding and agrees with this plan.

## 2024-08-16 NOTE — Telephone Encounter (Signed)
 Open in error

## 2024-08-17 ENCOUNTER — Encounter (HOSPITAL_BASED_OUTPATIENT_CLINIC_OR_DEPARTMENT_OTHER): Payer: Self-pay | Admitting: Nurse Practitioner

## 2024-08-17 ENCOUNTER — Ambulatory Visit (INDEPENDENT_AMBULATORY_CARE_PROVIDER_SITE_OTHER): Admitting: Nurse Practitioner

## 2024-08-17 VITALS — BP 132/72 | HR 80 | Ht 63.0 in | Wt 93.7 lb

## 2024-08-17 DIAGNOSIS — I150 Renovascular hypertension: Secondary | ICD-10-CM

## 2024-08-17 DIAGNOSIS — I773 Arterial fibromuscular dysplasia: Secondary | ICD-10-CM

## 2024-08-17 DIAGNOSIS — I6521 Occlusion and stenosis of right carotid artery: Secondary | ICD-10-CM | POA: Diagnosis not present

## 2024-08-17 DIAGNOSIS — Z7189 Other specified counseling: Secondary | ICD-10-CM

## 2024-08-17 DIAGNOSIS — N183 Chronic kidney disease, stage 3 unspecified: Secondary | ICD-10-CM

## 2024-08-17 DIAGNOSIS — I700 Atherosclerosis of aorta: Secondary | ICD-10-CM | POA: Diagnosis not present

## 2024-08-17 DIAGNOSIS — R7303 Prediabetes: Secondary | ICD-10-CM

## 2024-08-17 DIAGNOSIS — E785 Hyperlipidemia, unspecified: Secondary | ICD-10-CM

## 2024-08-17 DIAGNOSIS — I701 Atherosclerosis of renal artery: Secondary | ICD-10-CM | POA: Diagnosis not present

## 2024-08-17 NOTE — Progress Notes (Signed)
 Cardiology Office Note   Date:  08/17/2024  ID:  Hailey Hunt, DOB [birth year], MRN [id number] PCP: Hailey Verneita CROME, MD  [facility] Providers Cardiologist:  None     PMH Dyslipidemia Statin intolerance Hepatotoxicity due to statin drug Fibromuscular dysplasia Stage 3 kidney disease   Referred to Advanced Lipid disorders clinic and seen by Dr. Mona 05/05/2023.  History of statin intolerance having been placed on atorvastatin in the past by her FMD specialist with significant elevation in liver enzymes to the upper 100s to 200 range.  Fortunately this had resolved on recent labs.  She had been seen by hepatologist and was not noted to have underlying hepatic pathology.  She has diffuse FMD and there was concern that there was some involvement of the liver possibly with the mesenteric arteries.  She had been maintained on ezetimibe  which she tolerates well and did not have elevation in liver enzymes.  Her lipid profile at that time revealed total cholesterol 191, triglycerides 94, HDL 59, and LDL 91.  Target LDL 70 or lower given CT angiography of the head and neck that noted severe atherosclerosis of the neck vessels in addition to findings consistent with fibromuscular dysplasia.  This study was performed at [facility] where she sees a specialist.  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  She was started on Nexlizet  180-10 mg daily with significant improvement in lipids.  LDL particle number down to 519 from 1127, LDL 81, HDL 104, triglycerides 44 and small LDL particle less than 90.  Most recent lipid panel 06/08/2024 revealed LDL particle #514, LDL-C 70, HDL-C 89, triglycerides 49, and small LDL-P < 90.  Direct LDL was measured at 57.  History of Present Illness Discussed the use of AI scribe software for clinical note transcription with  the patient, who gave verbal consent to proceed.  History of Present Illness Hailey Hunt is a very pleasant 78 year old female who is here today to follow-up on dyslipidemia. She has history of fibromuscular dysplasia and chronic kidney disease. She has been managing fibromuscular dysplasia since 2014, with recently identified significant stenosis in her right carotid artery. Her physician that follows FMD is moving from [facility] to [place]. She is currently on Nexlizet  without any side effects. Chronic kidney disease, stage 3 alternating between 3a and 3b. Her nephrologist monitors her condition closely. Her family history includes her younger daughter having fibromuscular dysplasia and Ehlers-Danlos syndrome. She maintains an active lifestyle, walking two miles daily, regardless of weather. She will walk the 2 miles in her home if unable to walk outside. She uses an Apple watch to monitor her activity and to avoid prolonged sitting. She is conscious of her dietary intake, opting for healthier options like olive oil-based spreads and low-fat ham. She denies chest pain, shortness of breath, edema, fatigue, palpitations, weakness, presyncope, syncope, orthopnea, and PND. BP was measured one week ago at an appointment and was 102/60 mmHg.  She reports slightly elevated A1c over the past couple of years which is not overly concerning to her.  She admits to a fondness for [employer] and is trying to decrease her intake of tea and lemonade.   ROS: See HPI  Studies Reviewed EKG Interpretation Date/Time:  Wednesday August 17 2024 10:14:53 EST Ventricular Rate:  80 PR Interval:  144 QRS Duration:  86  QT Interval:  382 QTC Calculation: 440 R Axis:   -37  Text Interpretation: Normal sinus rhythm Left axis deviation Moderate voltage criteria for LVH, may be normal variant ( R in aVL , Cornell product ) Confirmed by Percy Browning [phone]) on 08/17/2024 10:30:33 AM    No results found for:  LIPOA  Risk Assessment/Calculations           Physical Exam VS:  BP 132/72   Pulse 80   Ht 5' 3 (1.6 m)   Wt 93 lb 11.2 oz (42.5 kg)   SpO2 97%   BMI 16.60 kg/m    Wt Readings from Last 3 Encounters:  08/17/24 93 lb 11.2 oz (42.5 kg)  07/27/24 94 lb 6.4 oz (42.8 kg)  06/08/24 91 lb 9.6 oz (41.5 kg)    GEN: Well nourished, well developed in no acute distress NECK: No JVD; No carotid bruits CARDIAC: RRR, no murmurs, rubs, gallops RESPIRATORY:  Clear to auscultation without rales, wheezing or rhonchi  ABDOMEN: Soft, non-tender, non-distended EXTREMITIES:  No edema; No deformity    Assessment & Plan Hyperlipidemia LDL goal  <70 Statin intolerance Hepatotoxicity Lipid panel completed 06/08/2024 with total cholesterol 158, triglycerides 58, HDL 89, and LDL 57.  Direct LDL at that time was also 57. Lipids are well controlled. History of significant elevation of liver enzymes on atorvastatin.  She reports history of elevated LP(a), however I am unable to find this information.  She is tolerating Nexlizet  without concerning side effects. She maintains a healthy lifestyle with regular walking. Admits to some dietary indiscretion with sugar but is working to improve this.  - Continue Nexlizet  180-10 mg daily - Follow-up lipid panel with PCP in March 2026 - Continue regular physical activity - Heart healthy diet avoiding saturated fat, processed foods, sugar, and other simple carbohydrates  Cardiac risk Aortic atherosclerosis Prior imaging of head and neck noted severe atherosclerosis of neck vessels. Recent carotid duplex revealed 40-59% stenosis of RICA. Prior CT of chest in 2018 reveals aortic atherosclerosis. Low risk ETT 01/2016. She denies chest pain, dyspnea, or other symptoms concerning for angina.  No indication for further ischemic evaluation at this time. - Continue Nexlizet , olmesartan, aspirin   Fibromuscular dysplasia  Right carotid artery stenosis   History of FMD  which is followed by specialist in [place] , who soon will be in [place]. Significant right carotid artery stenosis is present. She is asymptomatic.  - Continue Nexlizet  and aspirin - Continue follow up with the FMD specialist as directed  Chronic kidney disease stage 3   Prediabetes Hypertension BP is well controlled.  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  Stage 3 CKD with fluctuating GFR is being monitored by a nephrologist.  Most recent labs reveal stable kidney function.  A1C has fluctuated between 5.5 to 6.1 over the past few years. She is not on medication for diabetes. Is working to reduce sugar intake.  - Continue monitoring kidney function with the nephrologist - Maintain good hydration with water - Healthy diet avoiding saturated fat, processed foods, sugar, and other simple carbohydrates         Dispo: 1 year with Dr. Mona or me  Signed, Browning Percy, NP-C

## 2024-08-17 NOTE — Patient Instructions (Signed)
 Medication Instructions:   Your physician recommends that you continue on your current medications as directed. Please refer to the Current Medication list given to you today.   *If you need a refill on your cardiac medications before your next appointment, please call your pharmacy*  Lab Work:  None ordered.  If you have labs (blood work) drawn today and your tests are completely normal, you will receive your results only by: MyChart Message (if you have MyChart) OR A paper copy in the mail If you have any lab test that is abnormal or we need to change your treatment, we will call you to review the results.  Testing/Procedures:  None ordered.  Follow-Up: At [facility], you and your health needs are our priority.  As part of our continuing mission to provide you with exceptional heart care, our providers are all part of one team.  This team includes your primary Cardiologist (physician) and Advanced Practice Providers or APPs (Physician Assistants and Nurse Practitioners) who all work together to provide you with the care you need, when you need it.  Your next appointment:   1 year(s)  Provider:   K. Chad Hilty, MD or Rosaline Bane, NP    We recommend signing up for the patient portal called MyChart.  Sign up information is provided on this After Visit Summary.  MyChart is used to connect with patients for Virtual Visits (Telemedicine).  Patients are able to view lab/test results, encounter notes, upcoming appointments, etc.  Non-urgent messages can be sent to your provider as well.   To learn more about what you can do with MyChart, go to [email or web address].   Other Instructions  Your physician wants you to follow-up in: 1 year.  You will receive a reminder letter in the mail two months in advance. If you don't receive a letter, please call our office to schedule the follow-up appointment.  Adopting a Healthy Lifestyle.   Weight: Know what a healthy weight  is for you (roughly BMI <25) and aim to maintain this. You can calculate your body mass index on your smart phone. Unfortunately, this is not the most accurate measure of healthy weight, but it is the simplest measurement to use. A more accurate measurement involves body scanning which measures lean muscle, fat tissue and bony density. We do not have this equipment at [facility].    Diet: Aim for 7+ servings of fruits and vegetables daily Limit animal fats in diet for cholesterol and heart health - choose grass fed whenever available Avoid highly processed foods (fast food burgers, tacos, fried chicken, pizza, hot dogs, french fries)  Saturated fat comes in the form of butter, lard, coconut oil, margarine, partially hydrogenated oils, dairy products, and fat in meat. These increase your risk of cardiovascular disease.  Use healthy plant oils, such as olive, canola, soy, corn, sunflower and peanut.  Whole foods such as fruits, vegetables and whole grains have fiber  Men need > 38 grams of fiber per day Women need > 25 grams of fiber per day  Load up on vegetables and fruits - one-half of your plate: Aim for color and variety, and remember that potatoes dont count. Go for whole grains - one-quarter of your plate: Whole wheat, barley, wheat berries, quinoa, oats, brown rice, and foods made with them. If you want pasta, go with whole wheat pasta. Protein power - one-quarter of your plate: Fish, chicken, beans, and nuts are all healthy, versatile protein sources. Limit red meat. You need  carbohydrates for energy! The type of carbohydrate is more important than the amount. Choose carbohydrates such as vegetables, fruits, whole grains, beans, and nuts in the place of white rice, white pasta, potatoes (baked or fried), macaroni and cheese, cakes, cookies, and donuts.  If youre thirsty, drink water. Coffee and tea are good in moderation, but skip sugary drinks and limit milk and dairy products to one or two  daily servings. Keep sugar intake at 6 teaspoons or 24 grams or LESS       Exercise: Aim for 150 min of moderate intensity exercise weekly for heart health, and weights twice weekly for bone health Stay active - any steps are better than no steps! Aim for 7-9 hours of sleep daily   Sleep: This provides your body with the reset and relaxation that it needs!  Aim to get 7-8 hours of sleep each night. Limit caffeine, screen time, and other distractions prior to bedtime.  Keep your bedroom cool and dark and do not wear heavy clothing to bed or use heavy bed covers - layer if needed.

## 2024-08-29 DIAGNOSIS — I773 Arterial fibromuscular dysplasia: Secondary | ICD-10-CM | POA: Diagnosis not present

## 2024-09-25 ENCOUNTER — Other Ambulatory Visit: Payer: Self-pay | Admitting: Internal Medicine

## 2024-10-01 NOTE — Telephone Encounter (Signed)
 Prolia  VOB initiated via [email or web address]  Next Prolia  inj DUE: 11/07/24

## 2024-10-08 NOTE — Telephone Encounter (Signed)
 Medical Buy and Annette Stable - Prior Authorization NOT required for [employer]

## 2024-10-25 NOTE — Telephone Encounter (Signed)
 [employer]  Patient is ready for scheduling on or after 11/07/24  Out-of-pocket cost due at time of visit: $0  Primary: [place]  Medicare Prolia  co-insurance: 20% (approximately $352.56) Admin fee co-insurance: 20% (approximately $25)  Deductible: $0 of $283 met  Prior Auth: NOT required  Secondary: BCBS [place] Medicare Supplement Plan F Prolia  co-insurance: Covers Medicare Part B co-insurance Admin fee co-insurance: Covers Medicare Part B co-insurance  Deductible:  Covered by secondary  Prior Auth: NOT required PA# Valid:   ** This summary of benefits is an estimation of the patient's out-of-pocket cost. Exact cost may vary based on individual plan coverage.

## 2024-11-11 ENCOUNTER — Ambulatory Visit

## 2024-12-07 ENCOUNTER — Ambulatory Visit: Admitting: Internal Medicine

## 2025-06-12 ENCOUNTER — Ambulatory Visit

## 2025-07-27 ENCOUNTER — Ambulatory Visit: Admitting: Internal Medicine
